# Patient Record
Sex: Female | Born: 1955
Health system: Southern US, Community
[De-identification: ages and names within clinical notes are randomized; demographics above are authoritative.]

## PROBLEM LIST (undated history)

## (undated) DIAGNOSIS — H409 Unspecified glaucoma: Secondary | ICD-10-CM

## (undated) DIAGNOSIS — E7800 Pure hypercholesterolemia, unspecified: Secondary | ICD-10-CM

## (undated) DIAGNOSIS — R519 Headache, unspecified: Secondary | ICD-10-CM

## (undated) DIAGNOSIS — M5030 Other cervical disc degeneration, unspecified cervical region: Secondary | ICD-10-CM

## (undated) DIAGNOSIS — D649 Anemia, unspecified: Secondary | ICD-10-CM

## (undated) DIAGNOSIS — O1490 Unspecified pre-eclampsia, unspecified trimester: Secondary | ICD-10-CM

## (undated) DIAGNOSIS — N390 Urinary tract infection, site not specified: Secondary | ICD-10-CM

## (undated) DIAGNOSIS — K219 Gastro-esophageal reflux disease without esophagitis: Secondary | ICD-10-CM

## (undated) DIAGNOSIS — G8929 Other chronic pain: Secondary | ICD-10-CM

## (undated) DIAGNOSIS — K118 Other diseases of salivary glands: Secondary | ICD-10-CM

## (undated) DIAGNOSIS — R51 Headache: Secondary | ICD-10-CM

## (undated) DIAGNOSIS — K760 Fatty (change of) liver, not elsewhere classified: Secondary | ICD-10-CM

## (undated) HISTORY — DX: Headache: R51

## (undated) HISTORY — DX: Unspecified pre-eclampsia, unspecified trimester: O14.90

## (undated) HISTORY — DX: Anemia, unspecified: D64.9

## (undated) HISTORY — PX: COLONOSCOPY: SHX174

## (undated) HISTORY — DX: Other chronic pain: G89.29

## (undated) HISTORY — DX: Urinary tract infection, site not specified: N39.0

## (undated) HISTORY — DX: Gastro-esophageal reflux disease without esophagitis: K21.9

## (undated) HISTORY — DX: Headache, unspecified: R51.9

## (undated) HISTORY — PX: ABDOMINAL HYSTERECTOMY: SHX81

---

## 1985-04-05 HISTORY — PX: TUBAL LIGATION: SHX77

## 2004-05-05 ENCOUNTER — Ambulatory Visit: Payer: Self-pay | Admitting: Internal Medicine

## 2004-06-24 ENCOUNTER — Ambulatory Visit: Payer: Self-pay

## 2004-08-19 ENCOUNTER — Ambulatory Visit: Payer: Self-pay

## 2005-06-16 ENCOUNTER — Ambulatory Visit: Payer: Self-pay | Admitting: Internal Medicine

## 2005-07-20 ENCOUNTER — Ambulatory Visit: Payer: Self-pay | Admitting: Internal Medicine

## 2006-01-06 ENCOUNTER — Ambulatory Visit: Payer: Self-pay | Admitting: Gastroenterology

## 2006-09-28 ENCOUNTER — Ambulatory Visit: Payer: Self-pay

## 2006-10-11 ENCOUNTER — Ambulatory Visit: Payer: Self-pay | Admitting: Internal Medicine

## 2006-10-25 ENCOUNTER — Emergency Department: Payer: Self-pay | Admitting: Emergency Medicine

## 2006-11-09 ENCOUNTER — Ambulatory Visit: Payer: Self-pay | Admitting: Internal Medicine

## 2006-11-14 ENCOUNTER — Ambulatory Visit: Payer: Self-pay

## 2006-11-17 ENCOUNTER — Ambulatory Visit: Payer: Self-pay

## 2007-03-01 ENCOUNTER — Ambulatory Visit: Payer: Self-pay | Admitting: Gastroenterology

## 2007-03-21 ENCOUNTER — Ambulatory Visit: Payer: Self-pay

## 2007-03-23 ENCOUNTER — Inpatient Hospital Stay: Payer: Self-pay

## 2007-11-10 ENCOUNTER — Ambulatory Visit: Payer: Self-pay

## 2007-11-27 ENCOUNTER — Emergency Department: Payer: Self-pay | Admitting: Emergency Medicine

## 2008-11-26 ENCOUNTER — Ambulatory Visit: Payer: Self-pay | Admitting: Internal Medicine

## 2009-01-13 ENCOUNTER — Ambulatory Visit: Payer: Self-pay | Admitting: Internal Medicine

## 2010-01-14 ENCOUNTER — Ambulatory Visit: Payer: Self-pay | Admitting: Internal Medicine

## 2010-03-27 ENCOUNTER — Other Ambulatory Visit: Payer: Self-pay | Admitting: Internal Medicine

## 2011-03-26 ENCOUNTER — Inpatient Hospital Stay: Payer: Self-pay | Admitting: *Deleted

## 2011-04-07 ENCOUNTER — Ambulatory Visit: Payer: Self-pay | Admitting: Internal Medicine

## 2011-04-13 ENCOUNTER — Ambulatory Visit: Payer: Self-pay | Admitting: Internal Medicine

## 2012-05-29 ENCOUNTER — Encounter: Payer: Self-pay | Admitting: *Deleted

## 2012-05-30 ENCOUNTER — Ambulatory Visit (INDEPENDENT_AMBULATORY_CARE_PROVIDER_SITE_OTHER): Payer: BC Managed Care – PPO | Admitting: Internal Medicine

## 2012-05-30 VITALS — BP 140/90 | HR 64 | Temp 97.6°F | Ht 61.5 in | Wt 116.0 lb

## 2012-05-30 DIAGNOSIS — M5030 Other cervical disc degeneration, unspecified cervical region: Secondary | ICD-10-CM

## 2012-05-30 DIAGNOSIS — Z860100 Personal history of colon polyps, unspecified: Secondary | ICD-10-CM

## 2012-05-30 DIAGNOSIS — Z1239 Encounter for other screening for malignant neoplasm of breast: Secondary | ICD-10-CM

## 2012-05-30 DIAGNOSIS — Z13220 Encounter for screening for lipoid disorders: Secondary | ICD-10-CM

## 2012-05-30 DIAGNOSIS — R030 Elevated blood-pressure reading, without diagnosis of hypertension: Secondary | ICD-10-CM

## 2012-05-30 DIAGNOSIS — IMO0001 Reserved for inherently not codable concepts without codable children: Secondary | ICD-10-CM

## 2012-05-30 DIAGNOSIS — K219 Gastro-esophageal reflux disease without esophagitis: Secondary | ICD-10-CM

## 2012-05-30 LAB — COMPREHENSIVE METABOLIC PANEL
AST: 23 U/L (ref 0–37)
Albumin: 4 g/dL (ref 3.5–5.2)
Alkaline Phosphatase: 55 U/L (ref 39–117)
BUN: 16 mg/dL (ref 6–23)
Potassium: 4.6 mEq/L (ref 3.5–5.1)
Total Bilirubin: 0.8 mg/dL (ref 0.3–1.2)

## 2012-05-30 LAB — LDL CHOLESTEROL, DIRECT: Direct LDL: 119.7 mg/dL

## 2012-05-30 LAB — CBC WITH DIFFERENTIAL/PLATELET
Basophils Relative: 0.8 % (ref 0.0–3.0)
Eosinophils Absolute: 0.1 10*3/uL (ref 0.0–0.7)
Lymphs Abs: 2.4 10*3/uL (ref 0.7–4.0)
MCHC: 32.8 g/dL (ref 30.0–36.0)
MCV: 91.3 fl (ref 78.0–100.0)
Monocytes Absolute: 0.3 10*3/uL (ref 0.1–1.0)
Neutrophils Relative %: 29.9 % — ABNORMAL LOW (ref 43.0–77.0)
Platelets: 259 10*3/uL (ref 150.0–400.0)

## 2012-05-30 LAB — LIPID PANEL
Cholesterol: 214 mg/dL — ABNORMAL HIGH (ref 0–200)
HDL: 84.2 mg/dL (ref >39.00)
Total CHOL/HDL Ratio: 3
Triglycerides: 36 mg/dL (ref 0.0–149.0)
VLDL: 7.2 mg/dL (ref 0.0–40.0)

## 2012-05-30 MED ORDER — PANTOPRAZOLE SODIUM 40 MG PO TBEC: 40.0000 mg | DELAYED_RELEASE_TABLET | Freq: Every day | ORAL | Status: DC

## 2012-05-30 MED ORDER — CYCLOBENZAPRINE HCL 5 MG PO TABS: 5.0000 mg | ORAL_TABLET | Freq: Every evening | ORAL | Status: DC | PRN

## 2012-05-30 MED ORDER — ESTRADIOL 0.1 MG/GM VA CREA: TOPICAL_CREAM | VAGINAL | Status: DC

## 2012-05-31 ENCOUNTER — Other Ambulatory Visit: Payer: Self-pay | Admitting: Internal Medicine

## 2012-05-31 DIAGNOSIS — D72819 Decreased white blood cell count, unspecified: Secondary | ICD-10-CM

## 2012-05-31 NOTE — Progress Notes (Signed)
Order placed for follow up cbc 

## 2012-06-07 ENCOUNTER — Ambulatory Visit: Payer: Self-pay | Admitting: Internal Medicine

## 2012-06-11 ENCOUNTER — Encounter: Payer: Self-pay | Admitting: Internal Medicine

## 2012-06-11 DIAGNOSIS — M5030 Other cervical disc degeneration, unspecified cervical region: Secondary | ICD-10-CM | POA: Insufficient documentation

## 2012-06-11 DIAGNOSIS — K219 Gastro-esophageal reflux disease without esophagitis: Secondary | ICD-10-CM | POA: Insufficient documentation

## 2012-06-11 DIAGNOSIS — Z86010 Personal history of colonic polyps: Secondary | ICD-10-CM | POA: Insufficient documentation

## 2012-06-11 NOTE — Assessment & Plan Note (Signed)
On protonix.  Symptoms controlled.   

## 2012-06-11 NOTE — Progress Notes (Signed)
  Subjective:    Patient ID: Angel Taylor, female    DOB: November 07, 1955, 57 y.o.   MRN: [id number]  HPI 57 year old female with past history of reoccurring uti's and GERD who comes in today for a scheduled follow up and to switch her care over to [place].  She states the protonix is working.  Off premarin, just using estrace prn.  Uses Flexeril prn.  Helps her neck.  Handling stress relatively well.  No chest pain or tightness.  No sob.  Eating and drinking well.  Bowels stable.    Past Medical History  Diagnosis Date  . GERD (gastroesophageal reflux disease)   . Anemia   . Chronic headaches   . Frequent UTI   . Pre-eclampsia     Review of Systems Patient denies any headache, lightheadedness or dizziness.  No sinus or allergy symptoms.  No chest pain, tightness or palpitations.  No increased shortness of breath, cough or congestion.  No nausea or vomiting.  Protonix controlling her acid reflux.  No abdominal pain or cramping.  No bowel change, such as diarrhea, constipation, BRBPR or melana.  No urine change.   Seeing GI - regarding colonoscopy.      Objective:   Physical Exam Filed Vitals:   05/30/12 0842  BP: 140/90  Pulse: 64  Temp: 97.6 F (41.58 C)   57 year old female in no acute distress.   HEENT:  Nares- clear.  Oropharynx - without lesions. NECK:  Supple.  Nontender.  No audible bruit.  HEART:  Appears to be regular. LUNGS:  No crackles or wheezing audible.  Respirations even and unlabored.  RADIAL PULSE:  Equal bilaterally.   ABDOMEN:  Soft, nontender.  Bowel sounds present and normal.  No audible abdominal bruit.  EXTREMITIES:  No increased edema present.  DP pulses palpable and equal bilaterally.           Assessment & Plan:  CARDIOVASCULAR.  Had a stress test last year - negative for ischemia.  Currently asymptomatic.   GYN.  S/p hysterectomy.  Using estrace.  Off premarin.   PREVIOUS VISION CHANGE.  Worked up by Dr Oren Bracket.  Prisma helped.   ELEVATED BLOOD  PRESSURE.  She will spot check her pressure.  Get her back in soon to reassess.  If persistent elevation, will need medication.   HEALTH MAINTENANCE.  Schedule a physical for next visit.  Schedule mammogram.  Seeing GI for a follow up colonoscopy.

## 2012-06-11 NOTE — Assessment & Plan Note (Signed)
Colonoscopy 03/01/07 revealed an adenomatous polyp.  Due follow up 02/2012.  She is scheduled to follow up with GI for follow up colonoscopy.

## 2012-06-11 NOTE — Assessment & Plan Note (Signed)
Previous C-spine xray revealed loss of the normal cervical lordosis and degenerative disc disease involving C3-4, C4-5 and C5-6.  Flexeril prn helps.  Massage therapy helped.  Unable to afford physical therapy.

## 2012-06-23 ENCOUNTER — Encounter: Payer: Self-pay | Admitting: Internal Medicine

## 2012-06-26 ENCOUNTER — Other Ambulatory Visit: Payer: BC Managed Care – PPO

## 2012-07-06 ENCOUNTER — Encounter: Payer: Self-pay | Admitting: *Deleted

## 2012-07-24 ENCOUNTER — Encounter: Payer: BC Managed Care – PPO | Admitting: Internal Medicine

## 2012-08-08 ENCOUNTER — Encounter: Payer: BC Managed Care – PPO | Admitting: Internal Medicine

## 2012-09-04 ENCOUNTER — Encounter: Payer: Self-pay | Admitting: Internal Medicine

## 2012-09-04 ENCOUNTER — Ambulatory Visit (INDEPENDENT_AMBULATORY_CARE_PROVIDER_SITE_OTHER): Payer: BC Managed Care – PPO | Admitting: Internal Medicine

## 2012-09-04 VITALS — BP 110/80 | HR 63 | Temp 97.9°F | Ht 61.1 in | Wt 118.0 lb

## 2012-09-04 DIAGNOSIS — Z860100 Personal history of colon polyps, unspecified: Secondary | ICD-10-CM

## 2012-09-04 DIAGNOSIS — K219 Gastro-esophageal reflux disease without esophagitis: Secondary | ICD-10-CM

## 2012-09-04 DIAGNOSIS — M5030 Other cervical disc degeneration, unspecified cervical region: Secondary | ICD-10-CM

## 2012-09-05 ENCOUNTER — Other Ambulatory Visit (INDEPENDENT_AMBULATORY_CARE_PROVIDER_SITE_OTHER): Payer: BC Managed Care – PPO

## 2012-09-05 DIAGNOSIS — D72819 Decreased white blood cell count, unspecified: Secondary | ICD-10-CM

## 2012-09-05 LAB — CBC WITH DIFFERENTIAL/PLATELET
Basophils Absolute: 0.1 10*3/uL (ref 0.0–0.1)
Basophils Relative: 1.4 % (ref 0.0–3.0)
Eosinophils Absolute: 0.3 10*3/uL (ref 0.0–0.7)
Eosinophils Relative: 4.9 % (ref 0.0–5.0)
HCT: 38.6 % (ref 36.0–46.0)
Hemoglobin: 12.8 g/dL (ref 12.0–15.0)
Lymphocytes Relative: 54.8 % — ABNORMAL HIGH (ref 12.0–46.0)
Lymphs Abs: 3 10*3/uL (ref 0.7–4.0)
MCHC: 33.1 g/dL (ref 30.0–36.0)
MCV: 92.3 fl (ref 78.0–100.0)
Monocytes Absolute: 0.4 10*3/uL (ref 0.1–1.0)
Monocytes Relative: 7.2 % (ref 3.0–12.0)
Neutro Abs: 1.7 10*3/uL (ref 1.4–7.7)
Neutrophils Relative %: 31.7 % — ABNORMAL LOW (ref 43.0–77.0)
Platelets: 252 10*3/uL (ref 150.0–400.0)
RBC: 4.18 Mil/uL (ref 3.87–5.11)
RDW: 13.5 % (ref 11.5–14.6)
WBC: 5.4 10*3/uL (ref 4.5–10.5)

## 2012-09-06 ENCOUNTER — Encounter: Payer: Self-pay | Admitting: *Deleted

## 2012-09-06 ENCOUNTER — Encounter: Payer: Self-pay | Admitting: Internal Medicine

## 2012-09-06 NOTE — Assessment & Plan Note (Signed)
Colonoscopy 03/01/07 revealed an adenomatous polyp.  F/u colonoscopy 4/14 - no polyps.

## 2012-09-06 NOTE — Assessment & Plan Note (Signed)
On protonix.  Symptoms controlled.   

## 2012-09-06 NOTE — Assessment & Plan Note (Signed)
Previous C-spine xray revealed loss of the normal cervical lordosis and degenerative disc disease involving C3-4, C4-5 and C5-6.  Flexeril prn helps.  Massage therapy helped.  Unable to afford physical therapy.  Exercises helping.  Follow.

## 2012-09-06 NOTE — Progress Notes (Signed)
  Subjective:    Patient ID: Angel Taylor, female    DOB: 1956-01-21, 57 y.o.   MRN: [id number]  HPI 57 year old female with past history of reoccurring uti's and GERD who comes in today to follow up on these issues as well as for a complete physical exam.  She states the protonix is working.  No significant acid reflux.  Uses Flexeril prn.  Helps her neck.  Also, does neck exercises - which help.  Handling stress relatively well.  No chest pain or tightness.  No sob.  Eating and drinking well.  Bowels stable.  Had colonoscopy 4/14.  No polyps.     Past Medical History  Diagnosis Date  . GERD (gastroesophageal reflux disease)   . Anemia   . Chronic headaches   . Frequent UTI   . Pre-eclampsia     Current Outpatient Prescriptions on File Prior to Visit  Medication Sig Dispense Refill  . cyclobenzaprine (FLEXERIL) 5 MG tablet Take 1 tablet (5 mg total) by mouth at bedtime as needed for muscle spasms.  30 tablet  0  . estradiol (ESTRACE) 0.1 MG/GM vaginal cream Use as directed  42.5 g  1  . pantoprazole (PROTONIX) 40 MG tablet Take 1 tablet (40 mg total) by mouth daily.  30 tablet  3   No current facility-administered medications on file prior to visit.    Review of Systems Patient denies any headache, lightheadedness or dizziness.  No sinus or allergy symptoms.  No chest pain, tightness or palpitations.  No increased shortness of breath, cough or congestion.  No nausea or vomiting.  Protonix controlling her acid reflux.  No abdominal pain or cramping.  No bowel change, such as diarrhea, constipation, BRBPR or melana.  No urine change.   Sees GI.  Just had colonoscopy and EGD.  Neck exercises help.       Objective:   Physical Exam  Filed Vitals:   09/04/12 1058  BP: 110/80  Pulse: 63  Temp: 97.9 F (36.6 C)   Blood pressure recheck:  140-94/55-50  57 year old female in no acute distress.   HEENT:  Nares- clear.  Oropharynx - without lesions. NECK:  Supple.  Nontender.  No  audible bruit.  HEART:  Appears to be regular. LUNGS:  No crackles or wheezing audible.  Respirations even and unlabored.  RADIAL PULSE:  Equal bilaterally.    BREASTS:  No nipple discharge or nipple retraction present.  Could not appreciate any distinct nodules or axillary adenopathy.  ABDOMEN:  Soft, nontender.  Bowel sounds present and normal.  No audible abdominal bruit.  GU:  Normal external genitalia.  Vaginal vault without lesions.  S/p hysterectomy.  Could not appreciate any adnexal masses or tenderness.   RECTAL:  Heme negative.   EXTREMITIES:  No increased edema present.  DP pulses palpable and equal bilaterally.            Assessment & Plan:  CARDIOVASCULAR.  Had a stress test last year - negative for ischemia.  Currently asymptomatic.   GYN.  S/p hysterectomy.  Using estrace.  Off premarin.   PREVIOUS VISION CHANGE.  Worked up by Dr Oren Bracket.  Prism helped.   ELEVATED BLOOD PRESSURE.  She will spot check her pressure.  Get her back in soon to reassess.  If persistent elevation, will need medication.   HEALTH MAINTENANCE.  Physical today.  Mammogram 06/07/12 - Birads II.  Colonoscopy 4/14 - no polyps.

## 2012-11-10 ENCOUNTER — Ambulatory Visit: Payer: BC Managed Care – PPO | Admitting: Internal Medicine

## 2013-04-27 ENCOUNTER — Ambulatory Visit (INDEPENDENT_AMBULATORY_CARE_PROVIDER_SITE_OTHER)
Admission: RE | Admit: 2013-04-27 | Discharge: 2013-04-27 | Disposition: A | Discharge status: Home / Self Care | Payer: BC Managed Care – PPO | Source: Ambulatory Visit | Attending: Internal Medicine | Admitting: Internal Medicine

## 2013-04-27 ENCOUNTER — Encounter: Payer: Self-pay | Admitting: *Deleted

## 2013-04-27 ENCOUNTER — Encounter: Payer: Self-pay | Admitting: Internal Medicine

## 2013-04-27 ENCOUNTER — Ambulatory Visit (INDEPENDENT_AMBULATORY_CARE_PROVIDER_SITE_OTHER): Payer: BC Managed Care – PPO | Admitting: Internal Medicine

## 2013-04-27 VITALS — BP 130/80 | HR 76 | Temp 98.3°F | Ht 61.1 in | Wt 114.5 lb

## 2013-04-27 DIAGNOSIS — R059 Cough, unspecified: Secondary | ICD-10-CM

## 2013-04-27 DIAGNOSIS — R05 Cough: Secondary | ICD-10-CM

## 2013-04-27 DIAGNOSIS — J069 Acute upper respiratory infection, unspecified: Secondary | ICD-10-CM

## 2013-04-27 MED ORDER — PREDNISONE 10 MG PO TABS: ORAL_TABLET | ORAL | Status: DC

## 2013-04-27 MED ORDER — LEVOFLOXACIN 500 MG PO TABS: 500.0000 mg | ORAL_TABLET | Freq: Every day | ORAL | Status: DC

## 2013-04-27 MED ORDER — ALBUTEROL SULFATE HFA 108 (90 BASE) MCG/ACT IN AERS: 2.0000 | INHALATION_SPRAY | Freq: Four times a day (QID) | RESPIRATORY_TRACT | Status: DC | PRN

## 2013-04-27 NOTE — Patient Instructions (Signed)
Saline nasal spray - flush nose at least 2-3x/day.  Mucinex DM in the am and Robitussin DM in the evening

## 2013-04-27 NOTE — Progress Notes (Signed)
Pre-visit discussion using our clinic review tool. No additional management support is needed unless otherwise documented below in the visit note.  

## 2013-04-29 ENCOUNTER — Encounter: Payer: Self-pay | Admitting: Internal Medicine

## 2013-04-29 DIAGNOSIS — J069 Acute upper respiratory infection, unspecified: Secondary | ICD-10-CM | POA: Insufficient documentation

## 2013-04-29 NOTE — Assessment & Plan Note (Addendum)
Sinusitis/uri.  Treat with levaquin as directed.  Saline nasal flushes as directed.  mucnex DM and robitussin DM as directed.  Prednisone taper starting at 60mg  and decreasing by 5mg  each day until off.  Albuterol inhaler as directed.  Check cxr.

## 2013-04-29 NOTE — Progress Notes (Signed)
  Subjective:    Patient ID: Angel Taylor, female    DOB: Dec 18, 1955, 58 y.o.   MRN: [id number]  URI   58 year old female with past history of reoccurring uti's and GERD who comes in today as a work in with concerns regarding persistent cough and congestion.   She states the protonix is working.  No significant acid reflux.  States symptoms initially started several weeks ago.  Went to [facility].  Given a zpak and instructed to take mucinex.  One week ago developed increased sinus pressure.  Increased cough.  Took amoxicillin.  Did not tolerate.  Increased nasal congestion and stuffiness.  No sore throat.  Increased sinus pressure and cough.  Soreness from the cough.  Mucus production - colored green mucus.  Took Delsym last night.  Minimal nausea.  She is eating and drinking.  No vomiting.  no significant diarrhea.       Past Medical History  Diagnosis Date  . GERD (gastroesophageal reflux disease)   . Anemia   . Chronic headaches   . Frequent UTI   . Pre-eclampsia     Current Outpatient Prescriptions on File Prior to Visit  Medication Sig Dispense Refill  . cyclobenzaprine (FLEXERIL) 5 MG tablet Take 1 tablet (5 mg total) by mouth at bedtime as needed for muscle spasms.  30 tablet  0  . estradiol (ESTRACE) 0.1 MG/GM vaginal cream Use as directed  42.5 g  1  . pantoprazole (PROTONIX) 40 MG tablet Take 1 tablet (40 mg total) by mouth daily.  30 tablet  3   No current facility-administered medications on file prior to visit.    Review of Systems Patient denies any headache, lightheadedness or dizziness.  Does report increased sinus pressure and congestion.   No chest pain, tightness or palpitations.  No increased shortness of breath.  Does report increased cough and congestion.  Productive green mucus.   No  vomiting.  Minimal nausea.  Eating and drinking.  Protonix controlling her acid reflux.  No abdominal pain or cramping.  No bowel change, such as significant diarrhea.  Some loose stool.       Objective:   Physical Exam  Filed Vitals:   04/27/13 1424  BP: 130/80  Pulse: 76  Temp: 98.3 F (53.5 C)   58 year old female in no acute distress.   HEENT:  Nares- erythematous turbinates.   Oropharynx - without lesions.  Minimal tenderness to palpation over the sinuses. NECK:  Supple.  Nontender. HEART:  Appears to be regular. LUNGS:  No crackles or wheezing audible.  Respirations even and unlabored.  Increased congestion.  Cleared some with coughing.           Assessment & Plan:  CARDIOVASCULAR.  Had a stress test last year - negative for ischemia.  Currently asymptomatic.   HEALTH MAINTENANCE.  Physical last visit.  Mammogram 06/07/12 - Birads II.  Colonoscopy 4/14 - no polyps.

## 2013-05-02 ENCOUNTER — Encounter: Payer: Self-pay | Admitting: *Deleted

## 2013-05-02 ENCOUNTER — Telehealth: Payer: Self-pay | Admitting: *Deleted

## 2013-05-02 NOTE — Telephone Encounter (Signed)
Pt is feeling better, but not 100%-new work note placed up front for pick up.

## 2013-05-02 NOTE — Telephone Encounter (Signed)
Please confirm that pt is doing better.  If no better, needs reevaluation.  If better but just needing more time - I am ok to extend date for work note to return on Monday.

## 2013-05-02 NOTE — Telephone Encounter (Signed)
Pt called to inform you that she was not able to go back to work today. She still doesn't feel up to par. Still has rib pain when coughing & would like to have her work note extended to Monday to give her time to complete her medication. Pt requesting a call back today.

## 2013-05-04 NOTE — Telephone Encounter (Signed)
She had increased coughing and pain with coughing when I evaluated her.  If change in pain or worsening pain - needs reevaluation.  Since Friday pm - acute care or Saturday clinic.  If pain from coughing, may be sore for a while.  Can take tylenol.  If any sob, acute sx - needs eval.

## 2013-05-04 NOTE — Telephone Encounter (Signed)
Pt picked up work note this morning and wanted to know  Pt stated when she breaths deep and move she still has pain in chest area.  She wanted to know if this is normal and how long should this last.  She stated when she goes back to work she will have to move and lift stuff.  Pt is concerned about the pain  Please call pt

## 2013-05-04 NOTE — Telephone Encounter (Signed)
Please advise 

## 2013-05-05 NOTE — Telephone Encounter (Signed)
If feeling better, needs to monitor.  Let us know if persistent problems.

## 2013-05-05 NOTE — Telephone Encounter (Signed)
Cough has improved, just soreness with deep breath. Mucous is clear now & she is beginning to feel better. No increase in cough.

## 2013-05-11 ENCOUNTER — Ambulatory Visit (INDEPENDENT_AMBULATORY_CARE_PROVIDER_SITE_OTHER): Payer: BC Managed Care – PPO | Admitting: Adult Health

## 2013-05-11 ENCOUNTER — Encounter: Payer: Self-pay | Admitting: Adult Health

## 2013-05-11 VITALS — BP 106/66 | HR 73 | Temp 97.4°F | Resp 12 | Wt 118.0 lb

## 2013-05-11 DIAGNOSIS — M940 Chondrocostal junction syndrome [Tietze]: Secondary | ICD-10-CM

## 2013-05-11 DIAGNOSIS — J069 Acute upper respiratory infection, unspecified: Secondary | ICD-10-CM

## 2013-05-11 NOTE — Patient Instructions (Signed)
  Try ibuprofen for 2-3 days to see if this improves your symptoms. Take this with food.  Costochondritis Costochondritis is a condition in which the tissue (cartilage) that connects your ribs with your breastbone (sternum) becomes irritated. It causes pain in the chest and rib area. It usually goes away on its own over time. HOME CARE  Avoid activities that wear you out.  Do not strain your ribs. Avoid activities that use your:  Chest.  Belly.  Side muscles.  Put ice on the area for the first 2 days after the pain starts.  Put ice in a plastic bag.  Place a towel between your skin and the bag.  Leave the ice on for 20 minutes, 2 3 times a day.  Only take medicine as told by your doctor. GET HELP IF:  You have redness or puffiness (swelling) in the rib area.  Your pain does not go away with rest or medicine. GET HELP RIGHT AWAY IF:   Your pain gets worse.  You are very uncomfortable.  You have trouble breathing.  You cough up blood.  You start sweating or throwing up (vomiting).  You have a fever or lasting symptoms for more than 2 3 days.  You have a fever and your symptoms suddenly get worse. MAKE SURE YOU:   Understand these instructions.  Will watch your condition.  Will get help right away if you are not doing well or get worse. Document Released: 09/08/2007 Document Revised: 11/22/2012 Document Reviewed: 10/24/2012 [facility] Patient Information 2014 [place].   You can try applying some ice to the areas that are most tender.  Avoiding activity that aggravates your symptoms will allow you to heal faster.

## 2013-05-11 NOTE — Progress Notes (Signed)
Patient ID: Vernestine Brodhead, female   DOB: 06/09/1955, 58 y.o.   MRN: [id number]    Subjective:    Patient ID: Voncille Simm, female    DOB: April 14, 1955, 58 y.o.   MRN: [id number]  HPI  Past Medical History  Diagnosis Date  . GERD (gastroesophageal reflux disease)   . Anemia   . Chronic headaches   . Frequent UTI   . Pre-eclampsia     Current Outpatient Prescriptions on File Prior to Visit  Medication Sig Dispense Refill  . albuterol (PROVENTIL HFA;VENTOLIN HFA) 108 (90 BASE) MCG/ACT inhaler Inhale 2 puffs into the lungs every 6 (six) hours as needed for wheezing or shortness of breath.  1 Inhaler  0  . cyclobenzaprine (FLEXERIL) 5 MG tablet Take 1 tablet (5 mg total) by mouth at bedtime as needed for muscle spasms.  30 tablet  0  . estradiol (ESTRACE) 0.1 MG/GM vaginal cream Use as directed  42.5 g  1  . pantoprazole (PROTONIX) 40 MG tablet Take 1 tablet (40 mg total) by mouth daily.  30 tablet  3   No current facility-administered medications on file prior to visit.     Review of Systems  Constitutional: Negative for fever, chills and fatigue.  HENT: Negative for congestion, postnasal drip, rhinorrhea, sinus pressure and sore throat.   Respiratory: Positive for cough.        Rib pain from coughing. Cough has significantly improved  Cardiovascular: Negative.   All other systems reviewed and are negative.       Objective:  BP 106/66  Pulse 73  Temp(Src) 97.4 F (36.3 C) (Oral)  Resp 12  Wt 118 lb (53.524 kg)  SpO2 99%   Physical Exam  Constitutional: She is oriented to person, place, and time.  Cardiovascular: Normal rate, regular rhythm and normal heart sounds.  Exam reveals no gallop.   No murmur heard. Pulmonary/Chest: Effort normal and breath sounds normal. No respiratory distress. She has no wheezes. She has no rales.  Few rhonchi right upper lob.   Musculoskeletal: Normal range of motion.  Neurological: She is alert and oriented to person, place, and time. She has  normal reflexes.  Skin: Skin is warm and dry.  Psychiatric: She has a normal mood and affect. Her behavior is normal. Judgment and thought content normal.       Assessment & Plan:    1. URI (upper respiratory infection) Follow up for recent upper respiratory infection. She reports having excessive coughing during that time which began to hurt her ribs. The cough has improved significantly. Reports minimal coughing.  2. Costochondritis From excessive coughing. Ibuprofen for 2-3 days - take with food. Apply ice to the affected area and avoid activities that aggravate symptoms. RTC if no improvement within 1 week.

## 2013-05-11 NOTE — Progress Notes (Signed)
Pre visit review using our clinic review tool, if applicable. No additional management support is needed unless otherwise documented below in the visit note. 

## 2013-05-14 ENCOUNTER — Telehealth: Payer: Self-pay | Admitting: *Deleted

## 2013-05-14 NOTE — Telephone Encounter (Signed)
Saw Raquel on Friday to make sure lungs were clear & evaluate rib soreness. When Raquel listened to lungs, she heard 3 small pockets of fluid in lungs. Had discussed x-rays but held off since she has already had x-rays & treated with pneumonia. Wants to know if there is anything else that she needs to do about the fluid or will it resolve on its own.

## 2013-05-15 ENCOUNTER — Encounter: Payer: Self-pay | Admitting: Internal Medicine

## 2013-05-15 ENCOUNTER — Encounter: Payer: Self-pay | Admitting: *Deleted

## 2013-05-15 ENCOUNTER — Ambulatory Visit (INDEPENDENT_AMBULATORY_CARE_PROVIDER_SITE_OTHER): Payer: BC Managed Care – PPO | Admitting: Internal Medicine

## 2013-05-15 VITALS — BP 120/80 | HR 63 | Temp 98.0°F | Ht 61.1 in | Wt 114.8 lb

## 2013-05-15 DIAGNOSIS — J069 Acute upper respiratory infection, unspecified: Secondary | ICD-10-CM

## 2013-05-15 NOTE — Progress Notes (Signed)
Pre-visit discussion using our clinic review tool. No additional management support is needed unless otherwise documented below in the visit note.  

## 2013-05-15 NOTE — Telephone Encounter (Signed)
Left detailed message to see if patient could come this morning at 11:15.

## 2013-05-15 NOTE — Telephone Encounter (Signed)
I can work her in Tuesday 05/15/13 for reevaluation.

## 2013-05-15 NOTE — Telephone Encounter (Signed)
I spoke with patient's husband this morning & he is going to get in touch with Renee. I put her on the schedule for today at 3:15 & he will let her know.

## 2013-05-20 ENCOUNTER — Encounter: Payer: Self-pay | Admitting: Internal Medicine

## 2013-05-20 NOTE — Assessment & Plan Note (Signed)
Better.  Lungs clear.  Overall feels better and doing well.  Released to go back to work.

## 2013-05-20 NOTE — Progress Notes (Signed)
  Subjective:    Patient ID: Angel Taylor, female    DOB: May 12, 1955, 58 y.o.   MRN: [id number]  URI   58 year old female with past history of reoccurring uti's and GERD who comes in today as a work in with concerns regarding persistent cough and congestion.   She states the protonix is working.  No significant acid reflux.  States symptoms initially started several weeks ago.  Went to [facility].  Given a zpak and instructed to take mucinex.   Had persistent symptoms when I saw her last visit.  Started on a prednisone taper.  She comes in today to have me check her To confirm clearance.  She is eating and drinking.  No vomiting.  no significant diarrhea.   No significant cough, just occasional stuffiness.  Overall feels better.      Past Medical History  Diagnosis Date  . GERD (gastroesophageal reflux disease)   . Anemia   . Chronic headaches   . Frequent UTI   . Pre-eclampsia     Current Outpatient Prescriptions on File Prior to Visit  Medication Sig Dispense Refill  . albuterol (PROVENTIL HFA;VENTOLIN HFA) 108 (90 BASE) MCG/ACT inhaler Inhale 2 puffs into the lungs every 6 (six) hours as needed for wheezing or shortness of breath.  1 Inhaler  0  . cyclobenzaprine (FLEXERIL) 5 MG tablet Take 1 tablet (5 mg total) by mouth at bedtime as needed for muscle spasms.  30 tablet  0  . pantoprazole (PROTONIX) 40 MG tablet Take 1 tablet (40 mg total) by mouth daily.  30 tablet  3  . estradiol (ESTRACE) 0.1 MG/GM vaginal cream Use as directed  42.5 g  1   No current facility-administered medications on file prior to visit.    Review of Systems Patient denies any headache, lightheadedness or dizziness.  Minimal stuffiness.   No chest pain, tightness or palpitations. No increased shortness of breath.  Cough and congestion better.   No  vomiting.  No nausea.  Eating and drinking well.  Protonix controlling her acid reflux.  No abdominal pain or cramping.  No bowel change, such as significant diarrhea.       Objective:   Physical Exam  Filed Vitals:   05/15/13 1514  BP: 120/80  Pulse: 63  Temp: 98 F (64.68 C)   58 year old female in no acute distress.   HEENT:  Nares- clear.  Oropharynx - without lesions.   NECK:  Supple.  Nontender. HEART:  Appears to be regular. LUNGS:  No crackles or wheezing audible.  Respirations even and unlabored.  No congestion.  Lungs clear.            Assessment & Plan:  CARDIOVASCULAR.  Had a stress test last year - negative for ischemia.  Currently asymptomatic.   HEALTH MAINTENANCE.  Physical 09/04/12.  Mammogram 06/07/12 - Birads II.  Colonoscopy 4/14 - no polyps.

## 2013-06-12 ENCOUNTER — Ambulatory Visit: Payer: Self-pay | Admitting: Internal Medicine

## 2013-06-12 LAB — HM MAMMOGRAPHY: HM MAMMO: NEGATIVE

## 2013-06-14 ENCOUNTER — Encounter: Payer: Self-pay | Admitting: Internal Medicine

## 2013-09-21 ENCOUNTER — Ambulatory Visit: Payer: BC Managed Care – PPO | Admitting: Adult Health

## 2013-09-24 ENCOUNTER — Other Ambulatory Visit: Payer: Self-pay | Admitting: *Deleted

## 2013-09-24 MED ORDER — ESTRADIOL 0.1 MG/GM VA CREA: TOPICAL_CREAM | VAGINAL | Status: DC

## 2013-10-15 ENCOUNTER — Ambulatory Visit (INDEPENDENT_AMBULATORY_CARE_PROVIDER_SITE_OTHER): Payer: BC Managed Care – PPO | Admitting: Adult Health

## 2013-10-15 ENCOUNTER — Encounter: Payer: Self-pay | Admitting: Adult Health

## 2013-10-15 VITALS — BP 124/80 | HR 64 | Temp 98.2°F | Resp 14 | Ht 61.1 in | Wt 114.5 lb

## 2013-10-15 DIAGNOSIS — R300 Dysuria: Secondary | ICD-10-CM

## 2013-10-15 LAB — POCT URINALYSIS DIPSTICK
Bilirubin, UA: NEGATIVE
Blood, UA: NEGATIVE
Glucose, UA: NEGATIVE
Ketones, UA: NEGATIVE
Leukocytes, UA: NEGATIVE
Nitrite, UA: NEGATIVE
Protein, UA: NEGATIVE
Spec Grav, UA: 1.015
Urobilinogen, UA: 0.2
pH, UA: 7

## 2013-10-15 MED ORDER — PHENAZOPYRIDINE HCL 200 MG PO TABS: 200.0000 mg | ORAL_TABLET | Freq: Three times a day (TID) | ORAL | Status: DC | PRN

## 2013-10-15 MED ORDER — CIPROFLOXACIN HCL 250 MG PO TABS: 250.0000 mg | ORAL_TABLET | Freq: Two times a day (BID) | ORAL | Status: DC

## 2013-10-15 NOTE — Progress Notes (Signed)
Patient ID: Angel Taylor, female   DOB: 11-08-1955, 58 y.o.   MRN: [id number]   Subjective:    Patient ID: Angel Taylor, female    DOB: 21-Mar-1956, 58 y.o.   MRN: [id number]  HPI  Presents to clinic with dysuria. Symptoms have been ongoing for approximately 1-2 weeks. Treated with antibiotic at urgent care. Reports infection did not show up on UA but did on culture. Felt improvement with treatment but thinks it was not enough. Denies fever, chill or hematuria.  Past Medical History  Diagnosis Date  . GERD (gastroesophageal reflux disease)   . Anemia   . Chronic headaches   . Frequent UTI   . Pre-eclampsia     Current Outpatient Prescriptions on File Prior to Visit  Medication Sig Dispense Refill  . estradiol (ESTRACE) 0.1 MG/GM vaginal cream Use as directed  42.5 g  1  . pantoprazole (PROTONIX) 40 MG tablet Take 1 tablet (40 mg total) by mouth daily.  30 tablet  3   No current facility-administered medications on file prior to visit.     Review of Systems  Constitutional: Negative for fever and chills.  Genitourinary: Positive for dysuria, urgency and frequency. Negative for hematuria.       Objective:  BP 124/80  Pulse 64  Temp(Src) 98.2 F (36.8 C) (Oral)  Resp 14  Wt 114 lb 8 oz (51.937 kg)  SpO2 100%   Physical Exam  Constitutional: She is oriented to person, place, and time. No distress.  Cardiovascular: Normal rate and regular rhythm.   Pulmonary/Chest: Effort normal. No respiratory distress.  Musculoskeletal: Normal range of motion.  Neurological: She is alert and oriented to person, place, and time.  Skin: Skin is warm and dry.  Psychiatric: She has a normal mood and affect. Her behavior is normal. Judgment and thought content normal.      Assessment & Plan:   1. Dysuria Cipro 250 mg bid x 7 days. Urine for culture. Pyridium for dysuria. RTC if symptoms no improved within 4-5 days. - POCT urinalysis dipstick

## 2013-10-15 NOTE — Progress Notes (Signed)
Pre visit review using our clinic review tool, if applicable. No additional management support is needed unless otherwise documented below in the visit note. 

## 2013-10-15 NOTE — Patient Instructions (Signed)
  Start cipro 250 mg twice a day for 7 day.  Also, start pyridium 3 times a day as needed for pain with urination.  If no improvement within 4-5 days please follow up with Dr. Nicki Reaper

## 2013-10-17 LAB — URINE CULTURE: Colony Count: 70000

## 2014-05-17 ENCOUNTER — Encounter (INDEPENDENT_AMBULATORY_CARE_PROVIDER_SITE_OTHER): Payer: Self-pay

## 2014-05-17 ENCOUNTER — Ambulatory Visit (INDEPENDENT_AMBULATORY_CARE_PROVIDER_SITE_OTHER): Payer: BLUE CROSS/BLUE SHIELD | Admitting: Internal Medicine

## 2014-05-17 ENCOUNTER — Encounter: Payer: Self-pay | Admitting: Internal Medicine

## 2014-05-17 VITALS — BP 135/82 | HR 62 | Temp 98.0°F | Ht 61.1 in | Wt 119.4 lb

## 2014-05-17 DIAGNOSIS — R059 Cough, unspecified: Secondary | ICD-10-CM

## 2014-05-17 DIAGNOSIS — R05 Cough: Secondary | ICD-10-CM

## 2014-05-17 DIAGNOSIS — N6489 Other specified disorders of breast: Secondary | ICD-10-CM

## 2014-05-17 DIAGNOSIS — K219 Gastro-esophageal reflux disease without esophagitis: Secondary | ICD-10-CM

## 2014-05-17 DIAGNOSIS — R21 Rash and other nonspecific skin eruption: Secondary | ICD-10-CM

## 2014-05-17 MED ORDER — AMOXICILLIN 875 MG PO TABS: 875.0000 mg | ORAL_TABLET | Freq: Two times a day (BID) | ORAL | Status: DC

## 2014-05-17 NOTE — Progress Notes (Signed)
Pre visit review using our clinic review tool, if applicable. No additional management support is needed unless otherwise documented below in the visit note. 

## 2014-05-17 NOTE — Patient Instructions (Signed)
Saline nasal spray - flush nose at least 2-3x/day  Nasacort nasal spray - 2 sprays each nostril one time per day.  Do this in the evening.    Robitussin twice a day

## 2014-05-20 ENCOUNTER — Encounter: Payer: Self-pay | Admitting: Internal Medicine

## 2014-05-20 DIAGNOSIS — N6489 Other specified disorders of breast: Secondary | ICD-10-CM | POA: Insufficient documentation

## 2014-05-20 DIAGNOSIS — R059 Cough, unspecified: Secondary | ICD-10-CM | POA: Insufficient documentation

## 2014-05-20 DIAGNOSIS — R05 Cough: Secondary | ICD-10-CM | POA: Insufficient documentation

## 2014-05-20 DIAGNOSIS — R21 Rash and other nonspecific skin eruption: Secondary | ICD-10-CM | POA: Insufficient documentation

## 2014-05-20 NOTE — Assessment & Plan Note (Signed)
Acid reflux controlled.  Follow.   

## 2014-05-20 NOTE — Assessment & Plan Note (Signed)
Appears to be c/w fungal infection.  Lotrimin cream bid.  Follow.  Notify me if persistent.

## 2014-05-20 NOTE — Assessment & Plan Note (Signed)
Increased nasal congestion, cough and congestion.  Treat with saline nasal spray and nasacort nasal spray as directed.  Robitussin as directed.  Gave her a prescription for amoxicillin to have if infection progresses.  Follow.

## 2014-05-20 NOTE — Assessment & Plan Note (Signed)
Breast exam as outlined.  Left breast appears to be larger than the right.  No nodules appreciated.  Obtain diagnostic mammogram.  Pt wants to wait until next month when due for bilateral mammogram.

## 2014-05-20 NOTE — Progress Notes (Signed)
Patient ID: Angel Taylor, female   DOB: 04-12-1955, 59 y.o.   MRN: [id number]   Subjective:    Patient ID: Angel Taylor, female    DOB: [birth year], 59 y.o.   MRN: [id number]  HPI  Patient here as a work in with several concerns.  Noticed a rash on her left foot.  Some itching.  Dry skin.  Present for several months.  Has tried cortisone cream and aveeno.  She also started having increased post nasal drainage, nasal congestion, cough.  Mucus - colored.  Right side sinus pressure worse than left.  No vomiting.  No sob.  Feels acid reflux - controlled.  Eating and drinking well.  Bowels stable.     Past Medical History  Diagnosis Date  . GERD (gastroesophageal reflux disease)   . Anemia   . Chronic headaches   . Frequent UTI   . Pre-eclampsia     Current Outpatient Prescriptions on File Prior to Visit  Medication Sig Dispense Refill  . estradiol (ESTRACE) 0.1 MG/GM vaginal cream Use as directed 42.5 g 1  . pantoprazole (PROTONIX) 40 MG tablet Take 1 tablet (40 mg total) by mouth daily. 30 tablet 3   No current facility-administered medications on file prior to visit.    Review of Systems  Constitutional: Negative for appetite change and unexpected weight change.  HENT: Positive for congestion, postnasal drip and sinus pressure.   Respiratory: Positive for cough (increased congestion.  productive colored mucus.). Negative for chest tightness and shortness of breath.   Cardiovascular: Negative for chest pain, palpitations and leg swelling.       Increased fullness - left breast.  Left breast larger than right.    Gastrointestinal: Negative for nausea, vomiting, abdominal pain and diarrhea.  Skin: Positive for rash.       Rash - left foot.    Neurological: Negative for dizziness, light-headedness and headaches.       Objective:    Physical Exam  HENT:  Nose: Nose normal.  Mouth/Throat: Oropharynx is clear and moist.  Neck: Neck supple. No thyromegaly present.  Cardiovascular:  Normal rate and regular rhythm.   Pulmonary/Chest: Breath sounds normal. No respiratory distress. She has no wheezes.  Breast exam reveals no nipple discharge or nipple retraction present.  Could not appreciate any adnexal masses or tenderness.  Left breast larger than right.  No axillary adenopathy appreciated.    Abdominal: Soft. Bowel sounds are normal. There is no tenderness.  Musculoskeletal: She exhibits no edema or tenderness.  Lymphadenopathy:    She has no cervical adenopathy.  Skin:  Erythematous rash - left foot - plantar surface.  Some scaling of skin.      BP 135/82 mmHg  Pulse 62  Temp(Src) 98 F (36.7 C) (Oral)  Ht 5' 1.1" (1.552 m)  Wt 119 lb 6 oz (54.148 kg)  BMI 22.48 kg/m2  SpO2 99% Wt Readings from Last 3 Encounters:  05/17/14 119 lb 6 oz (54.148 kg)  10/15/13 114 lb 8 oz (51.937 kg)  05/15/13 114 lb 12 oz (52.05 kg)     Lab Results  Component Value Date   WBC 5.4 09/05/2012   HGB 12.8 09/05/2012   HCT 38.6 09/05/2012   PLT 252.0 09/05/2012   GLUCOSE 87 05/30/2012   CHOL 214* 05/30/2012   TRIG 36.0 05/30/2012   HDL 84.20 05/30/2012   LDLDIRECT 119.7 05/30/2012   ALT 17 05/30/2012   AST 23 05/30/2012   NA 138 05/30/2012   K 4.6 05/30/2012  CL 104 05/30/2012   CREATININE 0.9 05/30/2012   BUN 16 05/30/2012   CO2 27 05/30/2012   TSH 0.97 05/30/2012        Assessment & Plan:   Problem List Items Addressed This Visit    Cough    Increased nasal congestion, cough and congestion.  Treat with saline nasal spray and nasacort nasal spray as directed.  Robitussin as directed.  Gave her a prescription for amoxicillin to have if infection progresses.  Follow.        Fullness of breast    Breast exam as outlined.  Left breast appears to be larger than the right.  No nodules appreciated.  Obtain diagnostic mammogram.  Pt wants to wait until next month when due for bilateral mammogram.       Relevant Orders   MM Digital Diagnostic Bilat   GERD  (gastroesophageal reflux disease) - Primary    Acid reflux controlled.  Follow.        Rash    Appears to be c/w fungal infection.  Lotrimin cream bid.  Follow.  Notify me if persistent.          I spent 25 minutes with the patient and more than 50% of the time was spent in consultation regarding the above.     Einar Pheasant, MD

## 2014-06-14 ENCOUNTER — Ambulatory Visit: Payer: Self-pay | Admitting: Internal Medicine

## 2014-06-14 ENCOUNTER — Ambulatory Visit (INDEPENDENT_AMBULATORY_CARE_PROVIDER_SITE_OTHER): Payer: 59 | Admitting: Internal Medicine

## 2014-06-14 ENCOUNTER — Other Ambulatory Visit (HOSPITAL_COMMUNITY)
Admission: RE | Admit: 2014-06-14 | Discharge: 2014-06-14 | Disposition: A | Discharge status: Home / Self Care | Payer: 59 | Source: Ambulatory Visit | Attending: Internal Medicine | Admitting: Internal Medicine

## 2014-06-14 ENCOUNTER — Encounter: Payer: Self-pay | Admitting: Internal Medicine

## 2014-06-14 VITALS — BP 133/73 | HR 61 | Temp 98.1°F | Ht 61.0 in | Wt 120.2 lb

## 2014-06-14 DIAGNOSIS — Z1151 Encounter for screening for human papillomavirus (HPV): Secondary | ICD-10-CM | POA: Insufficient documentation

## 2014-06-14 DIAGNOSIS — Z01419 Encounter for gynecological examination (general) (routine) without abnormal findings: Secondary | ICD-10-CM | POA: Diagnosis present

## 2014-06-14 DIAGNOSIS — Z658 Other specified problems related to psychosocial circumstances: Secondary | ICD-10-CM

## 2014-06-14 DIAGNOSIS — Z0000 Encounter for general adult medical examination without abnormal findings: Secondary | ICD-10-CM

## 2014-06-14 DIAGNOSIS — F439 Reaction to severe stress, unspecified: Secondary | ICD-10-CM

## 2014-06-14 DIAGNOSIS — Z86010 Personal history of colonic polyps: Secondary | ICD-10-CM

## 2014-06-14 DIAGNOSIS — K5900 Constipation, unspecified: Secondary | ICD-10-CM

## 2014-06-14 DIAGNOSIS — K219 Gastro-esophageal reflux disease without esophagitis: Secondary | ICD-10-CM

## 2014-06-14 DIAGNOSIS — Z13220 Encounter for screening for lipoid disorders: Secondary | ICD-10-CM

## 2014-06-14 LAB — HM MAMMOGRAPHY: HM Mammogram: NEGATIVE

## 2014-06-14 NOTE — Progress Notes (Signed)
Pre visit review using our clinic review tool, if applicable. No additional management support is needed unless otherwise documented below in the visit note. 

## 2014-06-17 ENCOUNTER — Encounter: Payer: Self-pay | Admitting: Internal Medicine

## 2014-06-18 LAB — CYTOLOGY - PAP

## 2014-06-19 ENCOUNTER — Encounter: Payer: Self-pay | Admitting: *Deleted

## 2014-06-22 ENCOUNTER — Encounter: Payer: Self-pay | Admitting: Internal Medicine

## 2014-06-22 DIAGNOSIS — Z0000 Encounter for general adult medical examination without abnormal findings: Secondary | ICD-10-CM | POA: Insufficient documentation

## 2014-06-22 DIAGNOSIS — K5900 Constipation, unspecified: Secondary | ICD-10-CM | POA: Insufficient documentation

## 2014-06-22 DIAGNOSIS — F439 Reaction to severe stress, unspecified: Secondary | ICD-10-CM | POA: Insufficient documentation

## 2014-06-22 NOTE — Assessment & Plan Note (Signed)
Colonoscopy 03/01/07 revealed adenomatous polyp.  F/u colonoscopy 07/2012 - no polyps.

## 2014-06-22 NOTE — Progress Notes (Signed)
Patient ID: Angel Taylor, female   DOB: 1955/08/25, 59 y.o.   MRN: [id number]   Subjective:    Patient ID: Angel Taylor, female    DOB: 1955-12-23, 59 y.o.   MRN: [id number]  HPI  Patient here for her physical exam.  States she is doing better.  Stress is better.  Tries to stay active.  No cardiac symptoms with increased activity or exertion.  Breathing stable.  Eating and drinking well.  miralax working well for her.  Drinking more water.  Bowels better.     Past Medical History  Diagnosis Date  . GERD (gastroesophageal reflux disease)   . Anemia   . Chronic headaches   . Frequent UTI   . Pre-eclampsia     Current Outpatient Prescriptions on File Prior to Visit  Medication Sig Dispense Refill  . estradiol (ESTRACE) 0.1 MG/GM vaginal cream Use as directed 42.5 g 1  . pantoprazole (PROTONIX) 40 MG tablet Take 1 tablet (40 mg total) by mouth daily. 30 tablet 3   No current facility-administered medications on file prior to visit.    Review of Systems  Constitutional: Negative for appetite change and unexpected weight change.  HENT: Negative for congestion and sinus pressure.   Eyes: Negative for pain and visual disturbance.  Respiratory: Negative for cough, chest tightness and shortness of breath.   Cardiovascular: Negative for chest pain, palpitations and leg swelling.  Gastrointestinal: Positive for constipation (miralax working.  bowels better. ). Negative for nausea, vomiting, abdominal pain and diarrhea.  Genitourinary: Negative for frequency and difficulty urinating.  Musculoskeletal: Negative for back pain and joint swelling.  Skin: Negative for color change and rash.  Neurological: Negative for dizziness and headaches.  Hematological: Negative for adenopathy. Does not bruise/bleed easily.  Psychiatric/Behavioral: Negative for dysphoric mood and agitation.       Objective:    Physical Exam  Constitutional: She is oriented to person, place, and time. She appears  well-developed and well-nourished.  HENT:  Nose: Nose normal.  Mouth/Throat: Oropharynx is clear and moist.  Eyes: Right eye exhibits no discharge. Left eye exhibits no discharge. No scleral icterus.  Neck: Neck supple. No thyromegaly present.  Cardiovascular: Normal rate and regular rhythm.   Pulmonary/Chest: Breath sounds normal. No accessory muscle usage. No tachypnea. No respiratory distress. She has no decreased breath sounds. She has no wheezes. She has no rhonchi. Right breast exhibits no inverted nipple, no mass, no nipple discharge and no tenderness (no axillary adenopathy). Left breast exhibits no inverted nipple, no mass, no nipple discharge and no tenderness (no axilarry adenopathy).  Abdominal: Soft. Bowel sounds are normal. There is no tenderness.  Genitourinary:  Normal external genitalia.  Vaginal vault without lesions.  Cervix identified.  Pap smear performed.  Could not appreciate any adnexal masses or tenderness.    Musculoskeletal: She exhibits no edema or tenderness.  Lymphadenopathy:    She has no cervical adenopathy.  Neurological: She is alert and oriented to person, place, and time.  Skin: Skin is warm. No rash noted.  Psychiatric: She has a normal mood and affect. Her behavior is normal.    BP 133/73 mmHg  Pulse 61  Temp(Src) 98.1 F (36.7 C) (Oral)  Ht 5\' 1"  (1.549 m)  Wt 120 lb 4 oz (54.545 kg)  BMI 22.73 kg/m2  SpO2 100% Wt Readings from Last 3 Encounters:  06/14/14 120 lb 4 oz (54.545 kg)  05/17/14 119 lb 6 oz (54.148 kg)  10/15/13 114 lb 8 oz (  51.937 kg)     Lab Results  Component Value Date   WBC 5.4 09/05/2012   HGB 12.8 09/05/2012   HCT 38.6 09/05/2012   PLT 252.0 09/05/2012   GLUCOSE 87 05/30/2012   CHOL 214* 05/30/2012   TRIG 36.0 05/30/2012   HDL 84.20 05/30/2012   LDLDIRECT 119.7 05/30/2012   ALT 17 05/30/2012   AST 23 05/30/2012   NA 138 05/30/2012   K 4.6 05/30/2012   CL 104 05/30/2012   CREATININE 0.9 05/30/2012   BUN 16  05/30/2012   CO2 27 05/30/2012   TSH 0.97 05/30/2012       Assessment & Plan:   Problem List Items Addressed This Visit    Constipation    miralax helping.  Bowels doing better.        GERD (gastroesophageal reflux disease) - Primary    Reflux controlled.  On protonix.        Relevant Orders   CBC with Differential/Platelet   Comprehensive metabolic panel   TSH   Cytology - PAP (Completed)   Health care maintenance    Physical 06/14/14.  Mammogram 06/12/13 - Birads I.  She will schedule mammogram.  Colonoscopy 07/2012 - no polyps.        History of colonic polyps    Colonoscopy 03/01/07 revealed adenomatous polyp.  F/u colonoscopy 07/2012 - no polyps.        Relevant Orders   Cytology - PAP (Completed)   Stress    Handling stress well.  Stress is better.  Follow.         Other Visit Diagnoses    Screening cholesterol level        Relevant Orders    Lipid panel    Cytology - PAP (Completed)      I spent 25 minutes with the patient and more than 50% of the time was spent in consultation regarding the above.     Einar Pheasant, MD

## 2014-06-22 NOTE — Assessment & Plan Note (Signed)
Physical 06/14/14.  Mammogram 06/12/13 - Birads I.  She will schedule mammogram.  Colonoscopy 07/2012 - no polyps.

## 2014-06-22 NOTE — Assessment & Plan Note (Signed)
Reflux controlled.  On protonix.

## 2014-06-22 NOTE — Assessment & Plan Note (Signed)
Handling stress well.  Stress is better.  Follow.

## 2014-06-22 NOTE — Assessment & Plan Note (Signed)
miralax helping.  Bowels doing better.

## 2014-07-03 ENCOUNTER — Other Ambulatory Visit (INDEPENDENT_AMBULATORY_CARE_PROVIDER_SITE_OTHER): Payer: 59

## 2014-07-03 DIAGNOSIS — K219 Gastro-esophageal reflux disease without esophagitis: Secondary | ICD-10-CM

## 2014-07-03 DIAGNOSIS — Z13220 Encounter for screening for lipoid disorders: Secondary | ICD-10-CM | POA: Diagnosis not present

## 2014-07-03 LAB — COMPREHENSIVE METABOLIC PANEL
ALT: 14 U/L (ref 0–35)
AST: 21 U/L (ref 0–37)
Albumin: 4.2 g/dL (ref 3.5–5.2)
Alkaline Phosphatase: 58 U/L (ref 39–117)
BUN: 13 mg/dL (ref 6–23)
CHLORIDE: 102 meq/L (ref 96–112)
CO2: 29 meq/L (ref 19–32)
Calcium: 9.8 mg/dL (ref 8.4–10.5)
Creatinine, Ser: 0.89 mg/dL (ref 0.40–1.20)
GFR: 83.98 mL/min (ref >60.00)
Glucose, Bld: 95 mg/dL (ref 70–99)
Potassium: 4.4 mEq/L (ref 3.5–5.1)
Sodium: 136 mEq/L (ref 135–145)
Total Bilirubin: 0.7 mg/dL (ref 0.2–1.2)
Total Protein: 7.6 g/dL (ref 6.0–8.3)

## 2014-07-03 LAB — CBC WITH DIFFERENTIAL/PLATELET
Basophils Absolute: 0 10*3/uL (ref 0.0–0.1)
Basophils Relative: 0.6 % (ref 0.0–3.0)
Eosinophils Absolute: 0.1 10*3/uL (ref 0.0–0.7)
Eosinophils Relative: 2.4 % (ref 0.0–5.0)
HCT: 39.7 % (ref 36.0–46.0)
HEMOGLOBIN: 13.2 g/dL (ref 12.0–15.0)
LYMPHS PCT: 53.8 % — AB (ref 12.0–46.0)
Lymphs Abs: 2.8 10*3/uL (ref 0.7–4.0)
MCHC: 33.3 g/dL (ref 30.0–36.0)
MCV: 91 fl (ref 78.0–100.0)
MONO ABS: 0.4 10*3/uL (ref 0.1–1.0)
Monocytes Relative: 7.5 % (ref 3.0–12.0)
NEUTROS ABS: 1.9 10*3/uL (ref 1.4–7.7)
NEUTROS PCT: 35.7 % — AB (ref 43.0–77.0)
PLATELETS: 279 10*3/uL (ref 150.0–400.0)
RBC: 4.36 Mil/uL (ref 3.87–5.11)
RDW: 13.3 % (ref 11.5–15.5)
WBC: 5.2 10*3/uL (ref 4.0–10.5)

## 2014-07-03 LAB — LIPID PANEL
CHOL/HDL RATIO: 3
Cholesterol: 224 mg/dL — ABNORMAL HIGH (ref 0–200)
HDL: 79 mg/dL (ref >39.00)
LDL CALC: 132 mg/dL — AB (ref 0–99)
NonHDL: 145
Triglycerides: 67 mg/dL (ref 0.0–149.0)
VLDL: 13.4 mg/dL (ref 0.0–40.0)

## 2014-07-03 LAB — TSH: TSH: 2.3 u[IU]/mL (ref 0.35–4.50)

## 2014-07-04 ENCOUNTER — Encounter: Payer: Self-pay | Admitting: *Deleted

## 2014-07-28 NOTE — Discharge Summary (Signed)
PATIENT NAME:  Angel Taylor, Angel Taylor MR#:  [id number] DATE OF BIRTH:  [birth year]  DATE OF ADMISSION:  03/26/2011 DATE OF DISCHARGE:  03/27/2011  DISCHARGE DIAGNOSES:  1. Atypical chest pain, most likely nonischemic, and may be secondary to gastroesophageal reflux disease.  2. History of gastroesophageal reflux disease. 3. Degenerative disk disease with chronic neck pain.  4. History of colonic polyp.   CONSULTANT: Bartholome Bill, MD - Cardiology  HOSPITAL COURSE: This is a 59 year old female who has history of GERD. She takes a PPI at home. She presented with recurrent left-sided chest pain with some shortness of breath. She also had some heartburn and achy sensation with the chest pain. She also felt some anxiety. She was scheduled for an outpatient stress test, but because she presented with chest pain she was admitted. At the time of admission she had minimal elevation of her troponin when she came in with a troponin of 0.07. Her CPK and MB were normal; CK was 172 and MB 1.3. Her creatinine was normal at 1. She was admitted as non-STEMI and she was started on aspirin, beta blocker, and heparin drip, but repeat troponins have been negative. Her EKG when she came in showed that she has sinus rhythm, she has some Q waves in V2 and T wave inversions in V2. We have no prior EKG to compare with. A repeat EKG was done on 03/26/2011 which again showed some T wave inversions in lead V2 and some Q waves in V2, but no acute ischemic changes. Her repeat CPK, MB and troponins were negative. She had no further chest pain during the hospital stay. She ruled out for myocardial infarction. Cardiology suggested to do a functional study so the patient had a Myoview yesterday. The Myoview was reported as no evidence of ischemia. Chest pain appears to be nonischemic and no further cardiac work-up is needed at this time. Most likely her chest pain may be related to GERD. She is on a PPI and she can take Maalox or TUMS as needed for her  heartburn symptoms. At discharge her creatinine is normal at 0.83, her CBC is normal with hemoglobin of 11.3, normal white count of 5.5, and normal platelet count 234,000. She had a urinalysis done that was negative for any pyuria. She had a chest x-ray done, PA and lateral, which was negative. She is ambulating around. She has no further chest pain symptoms. I advised her to follow-up with Dr. Nicki Reaper and if she has any further chest pain she can be referred to Dr. Ubaldo Glassing as an outpatient.  DISCHARGE MEDICATIONS:  1. Protonix 40 mg daily.  2. TUMS 500 mg three times daily as needed.   CONDITION AT DISCHARGE: Comfortable. No chest pain. T-max 98.4, heart rate 71, blood pressure 111/68, and saturating 98% on room air. Chest is clear. Heart sounds are regular. Abdomen is soft and nontender. Murphy sign is negative.  If she has recurrence of chest pain then probably ultrasound of the abdomen can be done to rule out any gallbladder problems. Her Hemoglobin A1c was done in the hospital stay; it was 6.4, slightly elevated. LDL is 87. TSH is normal at 1.59. Her urine drug screen was negative.   DIET: Regular.   ACTIVITY: As tolerated.   DISCHARGE FOLLOWUP/INSTRUCTIONS: The patient can return to work on 04/08/2011. Followup with Dr. Nicki Reaper in one week. Follow up with [facility], Dr. Ubaldo Glassing, as needed.   TIME SPENT ON DISCHARGE: 40 minutes.  ____________________________ Mena Pauls, MD ag:slb  D: 03/27/2011 11:01:49 ET T: 03/28/2011 15:19:34 ET JOB#: [id number]  cc: Mena Pauls, MD, <Dictator> Einar Pheasant, MD Mena Pauls MD ELECTRONICALLY SIGNED 04/18/2011 20:54

## 2014-09-10 ENCOUNTER — Ambulatory Visit (INDEPENDENT_AMBULATORY_CARE_PROVIDER_SITE_OTHER): Payer: 59 | Admitting: Nurse Practitioner

## 2014-09-10 ENCOUNTER — Encounter: Payer: Self-pay | Admitting: Nurse Practitioner

## 2014-09-10 VITALS — BP 138/72 | HR 68 | Temp 98.0°F | Resp 12 | Ht 61.0 in | Wt 119.0 lb

## 2014-09-10 DIAGNOSIS — R21 Rash and other nonspecific skin eruption: Secondary | ICD-10-CM

## 2014-09-10 MED ORDER — TRIAMCINOLONE ACETONIDE 0.025 % EX OINT: 1.0000 "application " | TOPICAL_OINTMENT | Freq: Two times a day (BID) | CUTANEOUS | Status: DC

## 2014-09-10 MED ORDER — PREDNISONE 10 MG PO TABS: ORAL_TABLET | ORAL | Status: DC

## 2014-09-10 MED ORDER — ESTRADIOL 0.1 MG/GM VA CREA: TOPICAL_CREAM | VAGINAL | Status: DC

## 2014-09-10 NOTE — Patient Instructions (Signed)
Kenalog cream on areas to help with itching (neck and below)   Prednisone with breakfast  6 tablets on day 1 and day 2, 5 tablets on day 3 and day 4, 4 tablets on day 5 and day 6, 3 tablets day 7 and 8, 2 tablets day 9 and 10, 1 tablet day 11 and 12.   Do not take Aleve, naproxen, ibuprofen (NSAIDs) while on Prednisone.   Call if not helping by Friday.

## 2014-09-10 NOTE — Assessment & Plan Note (Signed)
Newest rash is generalized, found after weeding outside in the heat. She reports she does not remember pulling poison ivy/oak. Will try Prednisone taper over 12 days and kenalog ointment. Asked pt to follow up next week if no improvement.

## 2014-09-10 NOTE — Progress Notes (Signed)
   Subjective:    Patient ID: Angel Taylor, female    DOB: 19-Oct-1955, 58 y.o.   MRN: [id number]  HPI  Angel Taylor is a 59 yo female with a CC of neck rash and refill of vaginal cream.   1) Thursday out in the heat, felt itchy all over, pulled weeds out of flower bed  Papular, pruritic, spreading, denies systemic symptoms such as SOB, tongue/lips swelling, or trouble swallowing.   Treatment to date:  Benadryl- took when it first started Calamine- helped somewhat  No one else, denies changes to detergent   Review of Systems  Constitutional: Negative for fever, chills, diaphoresis and fatigue.  HENT: Negative for facial swelling.   Eyes: Negative for visual disturbance.  Gastrointestinal: Negative for nausea, vomiting and diarrhea.  Skin: Positive for rash.      Objective:   Physical Exam  Constitutional: She is oriented to person, place, and time. She appears well-developed and well-nourished. No distress.  BP 138/72 mmHg  Pulse 68  Temp(Src) 98 F (36.7 C)  Resp 12  Ht 5\' 1"  (1.549 m)  Wt 119 lb (53.978 kg)  BMI 22.50 kg/m2  SpO2 99%   HENT:  Head: Normocephalic and atraumatic.  Right Ear: External ear normal.  Left Ear: External ear normal.  Cardiovascular: Normal rate and regular rhythm.   Pulmonary/Chest: Effort normal and breath sounds normal. No respiratory distress. She has no wheezes. She has no rales. She exhibits no tenderness.  Neurological: She is alert and oriented to person, place, and time. No cranial nerve deficit. She exhibits normal muscle tone. Coordination normal.  Skin: Skin is warm and dry. No rash noted. She is not diaphoretic.     Psychiatric: She has a normal mood and affect. Her behavior is normal. Judgment and thought content normal.      Assessment & Plan:

## 2014-09-10 NOTE — Progress Notes (Signed)
Pre visit review using our clinic review tool, if applicable. No additional management support is needed unless otherwise documented below in the visit note. 

## 2014-12-12 ENCOUNTER — Ambulatory Visit: Payer: 59 | Admitting: Internal Medicine

## 2014-12-16 ENCOUNTER — Ambulatory Visit: Payer: 59 | Admitting: Internal Medicine

## 2015-02-03 ENCOUNTER — Ambulatory Visit: Payer: 59 | Admitting: Internal Medicine

## 2015-02-14 ENCOUNTER — Ambulatory Visit: Payer: 59 | Admitting: Internal Medicine

## 2015-07-01 ENCOUNTER — Encounter: Payer: 59 | Admitting: Internal Medicine

## 2015-07-16 ENCOUNTER — Other Ambulatory Visit: Payer: Self-pay | Admitting: Gastroenterology

## 2015-07-16 DIAGNOSIS — R1011 Right upper quadrant pain: Secondary | ICD-10-CM

## 2015-08-01 ENCOUNTER — Ambulatory Visit
Admission: RE | Admit: 2015-08-01 | Discharge: 2015-08-01 | Disposition: A | Discharge status: Home / Self Care | Payer: 59 | Source: Ambulatory Visit | Attending: Gastroenterology | Admitting: Gastroenterology

## 2015-08-01 DIAGNOSIS — R1011 Right upper quadrant pain: Secondary | ICD-10-CM | POA: Diagnosis not present

## 2015-08-01 DIAGNOSIS — K760 Fatty (change of) liver, not elsewhere classified: Secondary | ICD-10-CM | POA: Insufficient documentation

## 2016-01-04 ENCOUNTER — Other Ambulatory Visit: Payer: Self-pay | Admitting: Nurse Practitioner

## 2016-01-05 NOTE — Telephone Encounter (Signed)
Refilled 09/10/14. Pt last seen 09/10/15. Pt has cancelled 4 appointments and no showed 1 appointment with you. Please advise?

## 2016-01-06 ENCOUNTER — Other Ambulatory Visit: Payer: Self-pay | Admitting: Internal Medicine

## 2016-01-06 NOTE — Telephone Encounter (Signed)
Last seen 06/14/14, ok to fill?

## 2016-01-06 NOTE — Telephone Encounter (Signed)
Pt will need an appt before refilling the medication.  Please schedule.

## 2016-01-07 NOTE — Telephone Encounter (Signed)
Needs appt before refilling.  Please schedule.

## 2016-01-07 NOTE — Telephone Encounter (Signed)
Lm on vm for pt to schedule appt so she can get refill

## 2016-01-07 NOTE — Telephone Encounter (Signed)
Please make appointment soon for patient to get refill

## 2016-01-20 NOTE — Telephone Encounter (Signed)
Please schedule

## 2016-01-21 NOTE — Telephone Encounter (Signed)
[name] on vm to schedule appt

## 2016-01-21 NOTE — Telephone Encounter (Signed)
[name] on vm to schedule appt to get refill.Angel Taylor

## 2016-01-21 NOTE — Telephone Encounter (Signed)
I think this should have been sent to you.  Please schedule f/u appt.   Thanks

## 2016-01-23 NOTE — Telephone Encounter (Signed)
Pt called back and appt was scheduled 

## 2016-01-26 NOTE — Telephone Encounter (Signed)
appt scheduled

## 2016-02-23 ENCOUNTER — Ambulatory Visit: Payer: 59 | Admitting: Internal Medicine

## 2016-02-23 ENCOUNTER — Ambulatory Visit (INDEPENDENT_AMBULATORY_CARE_PROVIDER_SITE_OTHER): Payer: 59

## 2016-02-23 ENCOUNTER — Encounter: Payer: Self-pay | Admitting: Internal Medicine

## 2016-02-23 ENCOUNTER — Ambulatory Visit (INDEPENDENT_AMBULATORY_CARE_PROVIDER_SITE_OTHER): Payer: 59 | Admitting: Internal Medicine

## 2016-02-23 VITALS — BP 130/90 | HR 68 | Temp 98.1°F | Ht 61.0 in | Wt 117.2 lb

## 2016-02-23 DIAGNOSIS — K219 Gastro-esophageal reflux disease without esophagitis: Secondary | ICD-10-CM

## 2016-02-23 DIAGNOSIS — Z1231 Encounter for screening mammogram for malignant neoplasm of breast: Secondary | ICD-10-CM | POA: Diagnosis not present

## 2016-02-23 DIAGNOSIS — Z23 Encounter for immunization: Secondary | ICD-10-CM

## 2016-02-23 DIAGNOSIS — R300 Dysuria: Secondary | ICD-10-CM

## 2016-02-23 DIAGNOSIS — Z1239 Encounter for other screening for malignant neoplasm of breast: Secondary | ICD-10-CM

## 2016-02-23 DIAGNOSIS — R221 Localized swelling, mass and lump, neck: Secondary | ICD-10-CM

## 2016-02-23 DIAGNOSIS — Z86010 Personal history of colonic polyps: Secondary | ICD-10-CM

## 2016-02-23 DIAGNOSIS — H40059 Ocular hypertension, unspecified eye: Secondary | ICD-10-CM

## 2016-02-23 DIAGNOSIS — M542 Cervicalgia: Secondary | ICD-10-CM

## 2016-02-23 DIAGNOSIS — M5030 Other cervical disc degeneration, unspecified cervical region: Secondary | ICD-10-CM

## 2016-02-23 DIAGNOSIS — K760 Fatty (change of) liver, not elsewhere classified: Secondary | ICD-10-CM

## 2016-02-23 DIAGNOSIS — R030 Elevated blood-pressure reading, without diagnosis of hypertension: Secondary | ICD-10-CM

## 2016-02-23 LAB — URINALYSIS, ROUTINE W REFLEX MICROSCOPIC
Bilirubin Urine: NEGATIVE
HGB URINE DIPSTICK: NEGATIVE
Ketones, ur: NEGATIVE
LEUKOCYTES UA: NEGATIVE
Nitrite: NEGATIVE
PH: 6 (ref 5.0–8.0)
RBC / HPF: NONE SEEN (ref 0–?)
TOTAL PROTEIN, URINE-UPE24: NEGATIVE
URINE GLUCOSE: NEGATIVE
Urobilinogen, UA: 0.2 (ref 0.0–1.0)
WBC, UA: NONE SEEN (ref 0–?)

## 2016-02-23 MED ORDER — TIZANIDINE HCL 4 MG PO CAPS
4.0000 mg | ORAL_CAPSULE | Freq: Every evening | ORAL | 0 refills | Status: DC | PRN
Start: 2016-02-23 — End: 2018-07-07

## 2016-02-23 NOTE — Progress Notes (Signed)
Pre visit review using our clinic review tool, if applicable. No additional management support is needed unless otherwise documented below in the visit note. 

## 2016-02-23 NOTE — Progress Notes (Signed)
Patient ID: Angel Taylor, female   DOB: 06/23/1955, 60 y.o.   MRN: [id number]   Subjective:    Patient ID: Angel Taylor, female    DOB: 1955/11/27, 60 y.o.   MRN: [id number]  HPI  Patient here for a medication refill and follow up.  Last seen 06/14/14.  She was evaluated by GI for epigastric discomfort and nausea.  See GI note.  Evaluated 07/2015.  Had ultrasound.  Fatty liver.  On protonix.  Controlling upper symptoms.  No pain now.  Some slight decreased weight.  Is eating.  No vomiting.  Blood pressure has been elevated.  She also report she has been seeing opthalmology.  Elevated pressure in her eyes.  They are following.  Due f/u in 03/2016.  Reports increased neck pain.  Lying down is worse.  Turning her neck aggravates.  No significant pain radiating down her arms.  No numbness or tingling.  Does stretches in the am.     Past Medical History:  Diagnosis Date  . Anemia   . Chronic headaches   . Frequent UTI   . GERD (gastroesophageal reflux disease)   . Pre-eclampsia    Past Surgical History:  Procedure Laterality Date  . ABDOMINAL HYSTERECTOMY     total  . CESAREAN SECTION  1987  . TUBAL LIGATION  1987   Family History  Problem Relation Age of Onset  . Diabetes Mother   . Alzheimer's disease Mother   . Seizures Mother   . Heart disease Father    Social History   Social History  . Marital status: Married    Spouse name: N/A  . Number of children: 2  . Years of education: N/A   Social History Main Topics  . Smoking status: Never Smoker  . Smokeless tobacco: Never Used  . Alcohol use No  . Drug use: No  . Sexual activity: Not Asked   Other Topics Concern  . None   Social History Narrative  . None    Outpatient Encounter Prescriptions as of 02/23/2016  Medication Sig  . ESTRACE VAGINAL 0.1 MG/GM vaginal cream apply vaginally daily as directed by prescriber  . pantoprazole (PROTONIX) 40 MG tablet Take 1 tablet (40 mg total) by mouth daily.  . [DISCONTINUED]  predniSONE (DELTASONE) 10 MG tablet Take 6 tablets by mouth with breakfast for 2 days and decrease by 1 tablet every 2 days.  . [DISCONTINUED] triamcinolone (KENALOG) 0.025 % ointment Apply 1 application topically 2 (two) times daily.  Marland Kitchen tiZANidine (ZANAFLEX) 4 MG capsule Take 1 capsule (4 mg total) by mouth at bedtime as needed for muscle spasms.   No facility-administered encounter medications on file as of 02/23/2016.     Review of Systems  Constitutional: Negative for appetite change and unexpected weight change.  HENT: Negative for congestion and sinus pressure.   Respiratory: Negative for cough, chest tightness and shortness of breath.   Cardiovascular: Negative for chest pain, palpitations and leg swelling.  Gastrointestinal: Negative for abdominal pain, diarrhea, nausea and vomiting.  Genitourinary: Negative for difficulty urinating and dysuria.  Musculoskeletal: Positive for neck pain. Negative for back pain and joint swelling.  Skin: Negative for color change and rash.  Neurological: Negative for dizziness, light-headedness and headaches.  Psychiatric/Behavioral: Negative for agitation and dysphoric mood.       Objective:    Physical Exam  Constitutional: She appears well-developed and well-nourished. No distress.  HENT:  Nose: Nose normal.  Mouth/Throat: Oropharynx is clear and moist.  Neck: Neck supple. No thyromegaly present.  Right neck nodule.    Cardiovascular: Normal rate and regular rhythm.   Pulmonary/Chest: Breath sounds normal. No respiratory distress. She has no wheezes.  Abdominal: Soft. Bowel sounds are normal. There is no tenderness.  Musculoskeletal: She exhibits no edema or tenderness.  Increased pain with rotation of her neck.  Some limited rom.    Skin: No rash noted. No erythema.  Psychiatric: She has a normal mood and affect. Her behavior is normal.    BP 130/90   Pulse 68   Temp 98.1 F (36.7 C) (Oral)   Ht 5\' 1"  (1.549 m)   Wt 117 lb 3.2 oz  (53.2 kg)   SpO2 99%   BMI 22.14 kg/m  Wt Readings from Last 3 Encounters:  02/23/16 117 lb 3.2 oz (53.2 kg)  09/10/14 119 lb (54 kg)  06/14/14 120 lb 4 oz (54.5 kg)     Lab Results  Component Value Date   WBC 5.2 07/03/2014   HGB 13.2 07/03/2014   HCT 39.7 07/03/2014   PLT 279.0 07/03/2014   GLUCOSE 95 07/03/2014   CHOL 224 (H) 07/03/2014   TRIG 67.0 07/03/2014   HDL 79.00 07/03/2014   LDLDIRECT 119.7 05/30/2012   LDLCALC 132 (H) 07/03/2014   ALT 14 07/03/2014   AST 21 07/03/2014   NA 136 07/03/2014   K 4.4 07/03/2014   CL 102 07/03/2014   CREATININE 0.89 07/03/2014   BUN 13 07/03/2014   CO2 29 07/03/2014   TSH 2.30 07/03/2014    US Abdomen Limited Ruq  Result Date: 08/01/2015 CLINICAL DATA:  Right upper quadrant pain. EXAM: US ABDOMEN LIMITED - RIGHT UPPER QUADRANT COMPARISON:  None. FINDINGS: Gallbladder: No gallstones or wall thickening visualized. No sonographic Murphy sign noted by sonographer. Common bile duct: Diameter: 2 mm Liver: Liver is slightly echogenic consistent fatty infiltration and/or hepatocellular disease. Portal vein is patent with normal directional flow. IMPRESSION: 1. Liver is slightly echogenic consistent fatty infiltration and/or hepatocellular disease. 2. No acute or focal abnormality. Electronically Signed   By: Marcello Moores  Register   On: 08/01/2015 10:52       Assessment & Plan:   Problem List Items Addressed This Visit    Degenerative disc disease, cervical    Previous c-spine xray revealed loss of the normal cervical lordosis and DDD.  Trial of muscle relaxer.  Check c-spine.  Follow.  Exercise and massage therapy helped in the past.        Relevant Medications   tiZANidine (ZANAFLEX) 4 MG capsule   Elevated blood pressure reading    Blood pressure elevated.  Have her spot check her pressure.  Get her back in soon to reassess.  Follow pressures.  Follow metabolic panel.        Relevant Orders   Basic metabolic panel   Fatty liver     Ultrasound revealed fatty liver.  Follow liver function tests.        Relevant Orders   Hepatic function panel   Lipid panel   GERD (gastroesophageal reflux disease)    Doing well on protonix.  Follow.        Relevant Orders   CBC with Differential/Platelet   History of colonic polyps    Colonoscopy 07/2012 - no polyps.  Follow.        Increased pressure in the eye    Followed by opthalmology.        Neck nodule    Neck nodule.  Wanted  to refer to ENT.  She declines.  Follow.  Notify me if changes her mind.        Relevant Orders   TSH   Neck pain    Increased neck pain as outlined.  Limited rom.  No significant radicular symptoms.  Check c-spine xray.  Trial of a muscle relaxer.  Follow.        Relevant Orders   DG Cervical Spine 2 or 3 views (Completed)    Other Visit Diagnoses    Breast cancer screening    -  Primary   Relevant Orders   MM DIGITAL SCREENING BILATERAL   Dysuria       Relevant Orders   Urinalysis, Routine w reflex microscopic (not at [facility]) (Completed)   CULTURE, URINE COMPREHENSIVE (Completed)   Encounter for immunization       Relevant Medications   tiZANidine (ZANAFLEX) 4 MG capsule   Other Relevant Orders   DG Cervical Spine 2 or 3 views (Completed)   MM DIGITAL SCREENING BILATERAL   Urinalysis, Routine w reflex microscopic (not at [facility]) (Completed)   CULTURE, URINE COMPREHENSIVE (Completed)   Flu Vaccine QUAD 36+ mos IM (Completed)       Einar Pheasant, MD

## 2016-02-26 LAB — CULTURE, URINE COMPREHENSIVE

## 2016-02-28 ENCOUNTER — Other Ambulatory Visit: Payer: Self-pay | Admitting: Internal Medicine

## 2016-02-28 DIAGNOSIS — M542 Cervicalgia: Secondary | ICD-10-CM

## 2016-02-28 NOTE — Progress Notes (Signed)
Order placed for referral to physical therapy.

## 2016-02-29 ENCOUNTER — Encounter: Payer: Self-pay | Admitting: Internal Medicine

## 2016-02-29 DIAGNOSIS — I10 Essential (primary) hypertension: Secondary | ICD-10-CM | POA: Insufficient documentation

## 2016-02-29 DIAGNOSIS — R221 Localized swelling, mass and lump, neck: Secondary | ICD-10-CM | POA: Insufficient documentation

## 2016-02-29 DIAGNOSIS — R030 Elevated blood-pressure reading, without diagnosis of hypertension: Secondary | ICD-10-CM | POA: Insufficient documentation

## 2016-02-29 DIAGNOSIS — K760 Fatty (change of) liver, not elsewhere classified: Secondary | ICD-10-CM | POA: Insufficient documentation

## 2016-02-29 DIAGNOSIS — H40059 Ocular hypertension, unspecified eye: Secondary | ICD-10-CM | POA: Insufficient documentation

## 2016-02-29 NOTE — Assessment & Plan Note (Signed)
Blood pressure elevated.  Have her spot check her pressure.  Get her back in soon to reassess.  Follow pressures.  Follow metabolic panel.

## 2016-02-29 NOTE — Assessment & Plan Note (Signed)
Doing well on protonix.  Follow.   

## 2016-02-29 NOTE — Assessment & Plan Note (Signed)
Colonoscopy 07/2012 - no polyps.  Follow.

## 2016-02-29 NOTE — Assessment & Plan Note (Signed)
Neck nodule.  Wanted to refer to ENT.  She declines.  Follow.  Notify me if changes her mind.

## 2016-02-29 NOTE — Assessment & Plan Note (Signed)
Increased neck pain as outlined.  Limited rom.  No significant radicular symptoms.  Check c-spine xray.  Trial of a muscle relaxer.  Follow.

## 2016-02-29 NOTE — Assessment & Plan Note (Signed)
Followed by opthalmology.       

## 2016-02-29 NOTE — Assessment & Plan Note (Signed)
Ultrasound revealed fatty liver.  Follow liver function tests.

## 2016-02-29 NOTE — Assessment & Plan Note (Signed)
Previous c-spine xray revealed loss of the normal cervical lordosis and DDD.  Trial of muscle relaxer.  Check c-spine.  Follow.  Exercise and massage therapy helped in the past.

## 2016-03-01 ENCOUNTER — Encounter: Payer: Self-pay | Admitting: Internal Medicine

## 2016-03-10 ENCOUNTER — Other Ambulatory Visit (INDEPENDENT_AMBULATORY_CARE_PROVIDER_SITE_OTHER): Payer: 59

## 2016-03-10 ENCOUNTER — Ambulatory Visit: Payer: 59 | Attending: Internal Medicine

## 2016-03-10 VITALS — BP 146/86 | HR 69

## 2016-03-10 DIAGNOSIS — K219 Gastro-esophageal reflux disease without esophagitis: Secondary | ICD-10-CM

## 2016-03-10 DIAGNOSIS — M542 Cervicalgia: Secondary | ICD-10-CM | POA: Diagnosis present

## 2016-03-10 DIAGNOSIS — R221 Localized swelling, mass and lump, neck: Secondary | ICD-10-CM

## 2016-03-10 DIAGNOSIS — G8929 Other chronic pain: Secondary | ICD-10-CM | POA: Insufficient documentation

## 2016-03-10 DIAGNOSIS — M25512 Pain in left shoulder: Secondary | ICD-10-CM | POA: Diagnosis present

## 2016-03-10 DIAGNOSIS — K760 Fatty (change of) liver, not elsewhere classified: Secondary | ICD-10-CM

## 2016-03-10 DIAGNOSIS — R030 Elevated blood-pressure reading, without diagnosis of hypertension: Secondary | ICD-10-CM

## 2016-03-10 NOTE — Addendum Note (Signed)
Addended by: Leeanne Rio on: 03/10/2016 10:30 AM   Modules accepted: Orders

## 2016-03-10 NOTE — Patient Instructions (Signed)
  Pt was recommended to maintain proper posture (scapular retraction and depression with chin tuck) throughout the day. Pt demonstrated and verbalized understanding.

## 2016-03-10 NOTE — Therapy (Signed)
[facility] [facility] [address] [place], [address] Phone: [phone]   Fax:  [phone]  Physical Therapy Evaluation  Patient Details  Name: Angel Taylor MRN: [id number] Date of Birth: 12-26-1955 Referring Provider: Einar Pheasant, MD  Encounter Date: 03/10/2016      PT End of Session - 03/10/16 0808    Visit Number 1   Number of Visits 13   Date for PT Re-Evaluation 04/22/16   PT Start Time 0808   PT Stop Time 0908   PT Time Calculation (min) 60 min   Activity Tolerance Patient tolerated treatment well   Behavior During Therapy [facility] for tasks assessed/performed      Past Medical History:  Diagnosis Date  . Anemia   . Chronic headaches   . Frequent UTI   . GERD (gastroesophageal reflux disease)   . Pre-eclampsia     Past Surgical History:  Procedure Laterality Date  . ABDOMINAL HYSTERECTOMY     total  . CESAREAN SECTION  1987  . TUBAL LIGATION  1987    Vitals:   03/10/16 0816  BP: (!) 146/86  Pulse: 69         Subjective Assessment - 03/10/16 0817    Subjective neck pain level (neck pain extends to both shoulders L > R): 8/10 with neck movement, 5/10 neck pain currently (no movement and at neutral position), 9/10 at worst, 5/10 at best.    Pertinent History Neck pain. Pain also goes down her shoulders. Turning her head bothers her such as when she is driving, or reaching back with her arm L > R. Looking down and up and tilting her head side to side also bothers her neck. Using a contour pillow for her neck helps. Laying on her L side, her hands might go to sleep. No unexplained changes in weight. Had an MRI a couple of years ago for her neck which revealed degenerative changes and did PT which helped (years ago) her movement. Did not take her pain away completely. Had an x-ray recently which revealed spurs at her neck.    Patient Stated Goals reach back with her L arm better, move more freely    Currently in Pain? Yes   Pain Score 5    Pain Location Neck  neck; pain extends to both shoulders (L > R)   Pain Orientation Posterior   Pain Descriptors / Indicators Burning;Sharp;Aching   Pain Type Chronic pain   Pain Onset More than a month ago   Pain Frequency Constant   Aggravating Factors  cervical rotation, flexion, extension, side bending, (L > R), reaching back with her arm L > R   Pain Relieving Factors neutral position            [facility] PT Assessment - 03/10/16 0826      Assessment   Medical Diagnosis Neck pain   Referring Provider Einar Pheasant, MD   Onset Date/Surgical Date 02/28/16  Date PT referrel signed. Chronic condition.    Hand Dominance Right   Next MD Visit January 2018   Prior Therapy Pt had prior PT for her neck which helped with movement.      Precautions   Precaution Comments No known precautions.      Restrictions   Other Position/Activity Restrictions No known restrictions     Balance Screen   Has the patient fallen in the past 6 months No   Has the patient had a decrease in activity  level because of a fear of falling?  No   Is the patient reluctant to leave their home because of a fear of falling?  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 Difficulty performing VBI test secondary to neck pain. (-) Alar ligament test   Neck Disability Index  30%     Posture/Postural Control   Posture Comments bilaterally protracted shoulders and neck, movement crease at C5/C6. Slight R cervical rotation at around C5/C6 joint     AROM   Cervical Flexion WFL with reproduction  of L UT/rhomboid discomfort and cervical spine symptoms    Cervical Extension WFL with mid thoracic discomfort   Cervical - Right Side [facility] with L posterior UT area symptoms with slight R neck pain   Cervical - Left Side [facility] with R posterior UT area symptoms with slight L neck pain   Cervical - Right Rotation WFL with L scapular burning sensation with slight R neck pain   Cervical - Left Rotation WFL with L UT area pain and L scapular pain (inferior angle area).      Strength   Right Shoulder Flexion 4+/5   Right Shoulder ABduction 4+/5   Left Shoulder Flexion 5/5   Left Shoulder ABduction 4+/5   Right Elbow Flexion 4+/5   Right Elbow Extension 4/5   Left Elbow Flexion 4+/5   Left Elbow Extension 4-/5     Palpation   Palpation comment slight L rotation at T1, slight R rotation at T2, increased L rhomboid and L upper trap muscle tension. Muscle tension R upper trap/rhomboid area. Stiffness palpated L C2, C3, C4 transverse process. TTP L T2/T3 area transverse process      Objectives   Manual therapy  STM L upper trap   Slight decrease in neck pain with L cervical rotation   Pt education on scapular retraction with depression as well as chin tuck to help with posture and decrease UT muscle use.                       PT Education - 03/10/16 1242    Education provided Yes   Education Details plan of care, posture   Person(s) Educated Patient   Methods Explanation;Demonstration;Verbal cues   Comprehension Returned demonstration;Verbalized understanding             PT Long Term Goals - 03/10/16 1244      PT LONG TERM GOAL #1   Title Patient will have a decrease in neck pain to 5/10 or less at worst to promote ability to turn her head, look in different directions as well as reach with her L arm.    Baseline 9/10 at worst   Time 6   Status New     PT LONG TERM GOAL #2   Title Patient will improve her  Neck Disability Index Score by at least  10% as a demonstration of improved function.    Baseline 30%   Time 6   Period Weeks   Status New     PT  LONG TERM GOAL #3   Title Patient will be able to perform cervical AROM all planes with minimal to no complain of pain/symptoms to promote ability to turn her head, look around, and reach.    Time 6   Period Weeks   Status New               Plan - 03/10/16 1237    Clinical Impression Statement Patient is a 60 year old female who came to physical therapy secondary to neck pain. She also presents with reproduction of symptoms with cervical AROM; bilateral upper trap and rhomboid area muscle tension, TTP/stiffness to cervical vertebrae, poor posture, and difficulty performing functional tasks such as turning her head and reaching due to her symptoms. Patient will benefit from skilled physical therapy intervention to address the aforementioned deficits.    Rehab Potential Good   Clinical Impairments Affecting Rehab Potential pain, chronicity of condition    PT Frequency 2x / week   PT Duration 6 weeks   PT Treatment/Interventions Manual techniques;Therapeutic exercise;Therapeutic activities;Dry needling;Neuromuscular re-education;Patient/family education;Ultrasound;Electrical Stimulation;Traction   PT Next Visit Plan manual therapy, scapular strengthening, posture, modalities PRN   Consulted and Agree with Plan of Care Patient      Patient will benefit from skilled therapeutic intervention in order to improve the following deficits and impairments:  Pain, Postural dysfunction, Improper body mechanics, Decreased strength  Visit Diagnosis: Cervicalgia - Plan: PT plan of care cert/re-cert  Chronic left shoulder pain - Plan: PT plan of care cert/re-cert     Problem List Patient Active Problem List   Diagnosis Date Noted  . Fatty liver 02/29/2016  . Elevated blood pressure reading 02/29/2016  . Increased pressure in the eye 02/29/2016  . Neck nodule 02/29/2016  . Neck pain  02/23/2016  . Constipation 06/22/2014  . Stress 06/22/2014  . Health care maintenance 06/22/2014  . Rash 05/20/2014  . Cough 05/20/2014  . Fullness of breast 05/20/2014  . Costochondritis 05/11/2013  . URI (upper respiratory infection) 04/29/2013  . Degenerative disc disease, cervical 06/11/2012  . History of colonic polyps 06/11/2012  . GERD (gastroesophageal reflux disease) 06/11/2012    Joneen Boers PT, DPT  03/10/2016, 1:26 PM  [facility] [address] [place], [address] Phone: [phone]   Fax:  [phone]  Name: Guylene Jentzsch MRN: [id number] Date of Birth: [birth year]

## 2016-03-12 ENCOUNTER — Other Ambulatory Visit (INDEPENDENT_AMBULATORY_CARE_PROVIDER_SITE_OTHER): Payer: 59

## 2016-03-12 DIAGNOSIS — R030 Elevated blood-pressure reading, without diagnosis of hypertension: Secondary | ICD-10-CM

## 2016-03-12 DIAGNOSIS — R221 Localized swelling, mass and lump, neck: Secondary | ICD-10-CM

## 2016-03-12 DIAGNOSIS — K760 Fatty (change of) liver, not elsewhere classified: Secondary | ICD-10-CM | POA: Diagnosis not present

## 2016-03-12 LAB — BASIC METABOLIC PANEL
BUN: 14 mg/dL (ref 6–23)
CHLORIDE: 101 meq/L (ref 96–112)
CO2: 28 mEq/L (ref 19–32)
Calcium: 9.6 mg/dL (ref 8.4–10.5)
Creatinine, Ser: 0.87 mg/dL (ref 0.40–1.20)
GFR: 85.71 mL/min (ref >60.00)
Glucose, Bld: 89 mg/dL (ref 70–99)
POTASSIUM: 4.3 meq/L (ref 3.5–5.1)
SODIUM: 138 meq/L (ref 135–145)

## 2016-03-12 LAB — CBC WITH DIFFERENTIAL/PLATELET
BASOS ABS: 0 10*3/uL (ref 0.0–0.1)
Basophils Relative: 0.4 % (ref 0.0–3.0)
EOS ABS: 0.1 10*3/uL (ref 0.0–0.7)
Eosinophils Relative: 1.4 % (ref 0.0–5.0)
HCT: 39.8 % (ref 36.0–46.0)
Hemoglobin: 13.2 g/dL (ref 12.0–15.0)
LYMPHS ABS: 3.1 10*3/uL (ref 0.7–4.0)
LYMPHS PCT: 50.4 % — AB (ref 12.0–46.0)
MCHC: 33.3 g/dL (ref 30.0–36.0)
MCV: 91.5 fl (ref 78.0–100.0)
MONO ABS: 0.4 10*3/uL (ref 0.1–1.0)
Monocytes Relative: 7.3 % (ref 3.0–12.0)
NEUTROS ABS: 2.5 10*3/uL (ref 1.4–7.7)
NEUTROS PCT: 40.5 % — AB (ref 43.0–77.0)
PLATELETS: 264 10*3/uL (ref 150.0–400.0)
RBC: 4.35 Mil/uL (ref 3.87–5.11)
RDW: 13.3 % (ref 11.5–15.5)
WBC: 6.1 10*3/uL (ref 4.0–10.5)

## 2016-03-12 LAB — HEPATIC FUNCTION PANEL
ALT: 12 U/L (ref 0–35)
AST: 16 U/L (ref 0–37)
Albumin: 4.6 g/dL (ref 3.5–5.2)
Alkaline Phosphatase: 60 U/L (ref 39–117)
BILIRUBIN DIRECT: 0.1 mg/dL (ref 0.0–0.3)
TOTAL PROTEIN: 7.4 g/dL (ref 6.0–8.3)
Total Bilirubin: 0.7 mg/dL (ref 0.2–1.2)

## 2016-03-12 LAB — TSH: TSH: 1.16 u[IU]/mL (ref 0.35–4.50)

## 2016-03-12 LAB — LIPID PANEL
CHOLESTEROL: 228 mg/dL — AB (ref 0–200)
HDL: 79.9 mg/dL (ref >39.00)
LDL CALC: 138 mg/dL — AB (ref 0–99)
NonHDL: 148.17
TRIGLYCERIDES: 53 mg/dL (ref 0.0–149.0)
Total CHOL/HDL Ratio: 3
VLDL: 10.6 mg/dL (ref 0.0–40.0)

## 2016-03-13 ENCOUNTER — Encounter: Payer: Self-pay | Admitting: Internal Medicine

## 2016-03-15 ENCOUNTER — Ambulatory Visit: Payer: 59

## 2016-03-15 DIAGNOSIS — M25512 Pain in left shoulder: Secondary | ICD-10-CM

## 2016-03-15 DIAGNOSIS — M542 Cervicalgia: Secondary | ICD-10-CM

## 2016-03-15 DIAGNOSIS — G8929 Other chronic pain: Secondary | ICD-10-CM

## 2016-03-15 NOTE — Therapy (Signed)
[place] [facility] [address], [place], [address] Phone: [phone]   Fax:  [phone]  Physical Therapy Treatment  Patient Details  Name: Angel Taylor MRN: [id number] Date of Birth: 15-Nov-1955 Referring Provider: Einar Pheasant, MD  Encounter Date: 03/15/2016      PT End of Session - 03/15/16 1002    Visit Number 2   Number of Visits 13   Date for PT Re-Evaluation 04/22/16   PT Start Time [id number]   PT Stop Time 1050   PT Time Calculation (min) 48 min   Activity Tolerance Patient tolerated treatment well   Behavior During Therapy [facility] for tasks assessed/performed      Past Medical History:  Diagnosis Date  . Anemia   . Chronic headaches   . Frequent UTI   . GERD (gastroesophageal reflux disease)   . Pre-eclampsia     Past Surgical History:  Procedure Laterality Date  . ABDOMINAL HYSTERECTOMY     total  . CESAREAN SECTION  1987  . TUBAL LIGATION  1987    There were no vitals filed for this visit.      Subjective Assessment - 03/15/16 1002    Subjective Neck is doing ok. Turning to the L bothers her neck. 7/10 neck pain currently when turnign to the L   Pertinent History Neck pain. Pain also goes down her shoulders. Turning her head bothers her such as when she is driving, or reaching back with her arm L > R. Looking down and up and tilting her head side to side also bothers her neck. Using a contour pillow for her neck helps. Laying on her L side, her hands might go to sleep. No unexplained changes in weight. Had an MRI a couple of years ago for her neck which revealed degenerative changes and did PT which helped (years ago) her movement. Did not take her pain away completely. Had an x-ray recently which revealed spurs at her neck.    Patient Stated Goals reach back with her L arm better, move more freely   Currently in Pain? Yes   Pain Score 7    Pain Onset More than a month ago      Objectives    Manual therapy  STM L upper trap STM R upper trap and R rhomboid muscle area STM L upper trap/scalene muscle area              Slight decrease in neck pain with L cervical rotation    There-ex  Directed patient with seated manually resisted scapular retraction targeting lower trap   10x2 with 5 second holds for the L  10x2 with 5 second holds for the R  L first rib stretch with cervical rotation 10x3 each side for rotation  Decreased neck pain with L cervical rotation  R first rib stretch with cervical rotation 10x3 each side for rotation   standing bilateral shoulder extension resisting yellow band 10x3 with scapular retraction and abdominal muslce use.   Improved exercise technique, movement at target joints, use of target muscles after mod verbal, visual, tactile cues.     Decreased neck pain with L cervical rotation with manual therapy and exercises to decrease upper trap L > R and scalene muscle tension.                        PT Education - 03/15/16 1054    Education provided Yes  Education Details ther-ex, HEP   Person(s) Educated Patient   Methods Explanation;Demonstration;Tactile cues;Verbal cues;Handout   Comprehension Verbalized understanding;Returned demonstration             PT Long Term Goals - 03/10/16 1244      PT LONG TERM GOAL #1   Title Patient will have a decrease in neck pain to 5/10 or less at worst to promote ability to turn her head, look in different directions as well as reach with her L arm.    Baseline 9/10 at worst   Time 6   Status New     PT LONG TERM GOAL #2   Title Patient will improve her  Neck Disability Index Score by at least 10% as a demonstration of improved function.    Baseline 30%   Time 6   Period Weeks   Status New     PT LONG TERM GOAL #3   Title Patient will be able to perform cervical AROM all planes with minimal to no complain of pain/symptoms to promote ability to turn her head, look  around, and reach.    Time 6   Period Weeks   Status New               Plan - 03/15/16 1054    Clinical Impression Statement Decreased neck pain with L cervical rotation with manual therapy and exercises to decrease upper trap L > R and scalene muscle tension.    Rehab Potential Good   Clinical Impairments Affecting Rehab Potential pain, chronicity of condition    PT Frequency 2x / week   PT Duration 6 weeks   PT Treatment/Interventions Manual techniques;Therapeutic exercise;Therapeutic activities;Dry needling;Neuromuscular re-education;Patient/family education;Ultrasound;Electrical Stimulation;Traction   PT Next Visit Plan manual therapy, scapular strengthening, posture, modalities PRN   Consulted and Agree with Plan of Care Patient      Patient will benefit from skilled therapeutic intervention in order to improve the following deficits and impairments:  Pain, Postural dysfunction, Improper body mechanics, Decreased strength  Visit Diagnosis: Cervicalgia  Chronic left shoulder pain     Problem List Patient Active Problem List   Diagnosis Date Noted  . Fatty liver 02/29/2016  . Elevated blood pressure reading 02/29/2016  . Increased pressure in the eye 02/29/2016  . Neck nodule 02/29/2016  . Neck pain 02/23/2016  . Constipation 06/22/2014  . Stress 06/22/2014  . Health care maintenance 06/22/2014  . Rash 05/20/2014  . Cough 05/20/2014  . Fullness of breast 05/20/2014  . Costochondritis 05/11/2013  . URI (upper respiratory infection) 04/29/2013  . Degenerative disc disease, cervical 06/11/2012  . History of colonic polyps 06/11/2012  . GERD (gastroesophageal reflux disease) 06/11/2012   Joneen Boers PT, DPT   03/15/2016, 10:57 AM  [facility] [facility] [address] [place], [address] Phone: [phone]   Fax:  [phone]  Name: Angel Taylor MRN: [id number] Date of Birth: [birth year]

## 2016-03-15 NOTE — Patient Instructions (Addendum)
Strengthening: Resisted Extension    Hold band, one in each hand, arms forward. Pull arms back, elbow straight. Hold for 0-5 seconds Repeat ___10_ times per set. Do _3___ sets per session. Do _1__ sessions per day.  http://orth.exer.us/832   Copyright  VHI. All rights reserved.   Reviewed and given R and L 1st rib stretch with 10x3 cervical rotations daily as part of her HEP. Pt demonstrated and verbalized understanding. Handout provided.

## 2016-03-17 ENCOUNTER — Ambulatory Visit: Payer: 59

## 2016-03-18 ENCOUNTER — Ambulatory Visit: Payer: 59

## 2016-03-18 DIAGNOSIS — M542 Cervicalgia: Secondary | ICD-10-CM

## 2016-03-18 DIAGNOSIS — G8929 Other chronic pain: Secondary | ICD-10-CM

## 2016-03-18 DIAGNOSIS — M25512 Pain in left shoulder: Secondary | ICD-10-CM

## 2016-03-18 NOTE — Therapy (Signed)
[facility] [address], [place], [address] Phone: [phone]   Fax:  [phone]  Physical Therapy Treatment  Patient Details  Name: Angel Taylor MRN: [id number] Date of Birth: 03-Jan-1956 Referring Provider: Einar Pheasant, MD  Encounter Date: 03/18/2016      PT End of Session - 03/18/16 1057    Visit Number 3   Number of Visits 13   Date for PT Re-Evaluation 04/22/16   PT Start Time 1057  pt arrived late   PT Stop Time 1144   PT Time Calculation (min) 47 min   Activity Tolerance Patient tolerated treatment well   Behavior During Therapy [facility] for tasks assessed/performed      Past Medical History:  Diagnosis Date  . Anemia   . Chronic headaches   . Frequent UTI   . GERD (gastroesophageal reflux disease)   . Pre-eclampsia     Past Surgical History:  Procedure Laterality Date  . ABDOMINAL HYSTERECTOMY     total  . CESAREAN SECTION  1987  . TUBAL LIGATION  1987    There were no vitals filed for this visit.      Subjective Assessment - 03/18/16 1059    Subjective Neck is a little more flexible. Not as painful when turning her head to the L and to the R. 5-6/10 neck pain if she pushes the L rotation motion.    Pertinent History Neck pain. Pain also goes down her shoulders. Turning her head bothers her such as when she is driving, or reaching back with her arm L > R. Looking down and up and tilting her head side to side also bothers her neck. Using a contour pillow for her neck helps. Laying on her L side, her hands might go to sleep. No unexplained changes in weight. Had an MRI a couple of years ago for her neck which revealed degenerative changes and did PT which helped (years ago) her movement. Did not take her pain away completely. Had an x-ray recently which revealed spurs at her neck.    Patient Stated Goals reach back with her L arm better, move more freely   Currently in Pain? Yes    Pain Score 6    Pain Onset More than a month ago                                 PT Education - 03/18/16 1129    Education provided Yes   Education Details ther-ex, HEP   Person(s) Educated Patient   Methods Explanation;Demonstration;Tactile cues;Verbal cues;Handout   Comprehension Returned demonstration;Verbalized understanding        Objectives   Manual therapy  Prone STM to L rhomboid muscle area. Improved lower cervical and upper thoracic vertebral neural alignment palpated. Decreased neck pain.  STM L upper trap/scalene muscle area      There-ex  Directed patient with seated chin tucks with scapular retraction 10x2 with 5 seconds  Then with gentle manual resistance targeting L posterior cervical muscles (secondary to more muscle tone R posterior cervical muscles) 10x2 with 5 second holds.   standing bilateral shoulder extension resisting yellow band 10x2 with 5 second holds with scapular retraction and abdominal muslce use.    L first rib stretch with cervical rotation 10x2 each side for rotation  Then with R cervical side bend 10x5 seconds for 3 sets.   Improved exercise technique,  movement at target joints, use of target muscles after mod verbal, visual, tactile cues.     Decreased neck pain and improved vertebral neutral alignment after soft tissue mobilization to the L rhomboid muscle area. Worked on cervical and scapular strengthening as well as improving L scalene muscle flexibility. 4-5/10 neck pain with L cervical rotation after session.          PT Long Term Goals - 03/10/16 1244      PT LONG TERM GOAL #1   Title Patient will have a decrease in neck pain to 5/10 or less at worst to promote ability to turn her head, look in different directions as well as reach with her L arm.    Baseline 9/10 at worst   Time 6   Status New     PT LONG TERM GOAL #2   Title Patient will improve her  Neck Disability Index Score by at  least 10% as a demonstration of improved function.    Baseline 30%   Time 6   Period Weeks   Status New     PT LONG TERM GOAL #3   Title Patient will be able to perform cervical AROM all planes with minimal to no complain of pain/symptoms to promote ability to turn her head, look around, and reach.    Time 6   Period Weeks   Status New               Plan - 03/18/16 1134    Clinical Impression Statement Decreased neck pain and improved vertebral neutral alignment after soft tissue mobilization to the L rhomboid muscle area. Worked on cervical and scapular strengthening as well as improving L scalene muscle flexibility. 4-5/10 neck pain with L cervical rotation after session.    Rehab Potential Good   Clinical Impairments Affecting Rehab Potential pain, chronicity of condition    PT Frequency 2x / week   PT Duration 6 weeks   PT Treatment/Interventions Manual techniques;Therapeutic exercise;Therapeutic activities;Dry needling;Neuromuscular re-education;Patient/family education;Ultrasound;Electrical Stimulation;Traction   PT Next Visit Plan manual therapy, scapular strengthening, posture, modalities PRN   Consulted and Agree with Plan of Care Patient      Patient will benefit from skilled therapeutic intervention in order to improve the following deficits and impairments:  Pain, Postural dysfunction, Improper body mechanics, Decreased strength  Visit Diagnosis: Cervicalgia  Chronic left shoulder pain     Problem List Patient Active Problem List   Diagnosis Date Noted  . Fatty liver 02/29/2016  . Elevated blood pressure reading 02/29/2016  . Increased pressure in the eye 02/29/2016  . Neck nodule 02/29/2016  . Neck pain 02/23/2016  . Constipation 06/22/2014  . Stress 06/22/2014  . Health care maintenance 06/22/2014  . Rash 05/20/2014  . Cough 05/20/2014  . Fullness of breast 05/20/2014  . Costochondritis 05/11/2013  . URI (upper respiratory infection) 04/29/2013   . Degenerative disc disease, cervical 06/11/2012  . History of colonic polyps 06/11/2012  . GERD (gastroesophageal reflux disease) 06/11/2012   Joneen Boers PT, DPT   03/18/2016, 11:50 AM  [facility] [address], [place], [address] Phone: [phone]   Fax:  [phone]  Name: Bekki Man MRN: [id number] Date of Birth: [birth year]

## 2016-03-18 NOTE — Patient Instructions (Signed)
  Tilt your head to the right side to feel a stretch at the left side of your neck while the towel is providing downward pressure to your left shoulder.   Hold for 5-10 seconds for 10 times, 3 sets daily.

## 2016-03-22 ENCOUNTER — Ambulatory Visit: Payer: 59

## 2016-03-22 DIAGNOSIS — M25512 Pain in left shoulder: Secondary | ICD-10-CM

## 2016-03-22 DIAGNOSIS — G8929 Other chronic pain: Secondary | ICD-10-CM

## 2016-03-22 DIAGNOSIS — M542 Cervicalgia: Secondary | ICD-10-CM | POA: Diagnosis not present

## 2016-03-22 NOTE — Therapy (Signed)
[facility] [address] [place], [address] Phone: [phone]   Fax:  [phone]  Physical Therapy Treatment  Patient Details  Name: Angel Taylor MRN: [id number] Date of Birth: [birth year] Referring Provider: Einar Pheasant, MD  Encounter Date: 03/22/2016      PT End of Session - 03/22/16 1040    Visit Number 4   Number of Visits 13   Date for PT Re-Evaluation 04/22/16   PT Start Time 1042  pt arrived late and used restroom   PT Stop Time 1113   PT Time Calculation (min) 31 min   Activity Tolerance Patient tolerated treatment well   Behavior During Therapy [facility] for tasks assessed/performed      Past Medical History:  Diagnosis Date  . Anemia   . Chronic headaches   . Frequent UTI   . GERD (gastroesophageal reflux disease)   . Pre-eclampsia     Past Surgical History:  Procedure Laterality Date  . ABDOMINAL HYSTERECTOMY     total  . CESAREAN SECTION  1987  . TUBAL LIGATION  1987    There were no vitals filed for this visit.      Subjective Assessment - 03/22/16 1043    Subjective Neck is ok. Kind of stiff. Does not know if its the way she slept. Loosened up after she worked on it a little bit. 5/10 maybe (currently).   Pertinent History Neck pain. Pain also goes down her shoulders. Turning her head bothers her such as when she is driving, or reaching back with her arm L > R. Looking down and up and tilting her head side to side also bothers her neck. Using a contour pillow for her neck helps. Laying on her L side, her hands might go to sleep. No unexplained changes in weight. Had an MRI a couple of years ago for her neck which revealed degenerative changes and did PT which helped (years ago) her movement. Did not take her pain away completely. Had an x-ray recently which revealed spurs at her neck.    Patient Stated Goals reach back with her L arm better, move more freely   Currently in Pain?  Yes   Pain Score 5    Pain Onset More than a month ago                                 PT Education - 03/22/16 1102    Education provided Yes   Education Details ther-ex   [employer]) Educated Patient   Methods Demonstration;Explanation;Tactile cues;Verbal cues   Comprehension Returned demonstration;Verbalized understanding        Objectives   Manual therapy  seated STM to L rhomboid muscle area. Improved lower cervical and upper thoracic vertebral neural alignment palpated. Decreased neck pain.  STM L upper trap/scalene muscle area  Decreased pain     There-ex  Directed patient with standing R shoulder extension with scapular retraction resisting yellow band 10x5 seconds for 2 sets  Decreased neck pain with L cervical rotation  seated chin tucks with scapular retraction 10x with 5 seconds with gentle manual resistance targeting L posterior cervical muscles  Then both posterior cervical muscles at the same time 10x5 seconds  standing bilateral shoulder extension resisting yellow band 10x2 with 5 second holds with scapular retraction.    Improved exercise technique, movement at target joints, use of target muscles after mod  verbal, visual, tactile cues.     Decreased L cervical pain after STM to L rhomboid and scalene muscles, as well as exercises to promote R scapular strength. Decreased neck pain to 4/10 after session. Pt demonstrates L rotation at C7 area in which more neutral alignment palpated after session.         PT Long Term Goals - 03/10/16 1244      PT LONG TERM GOAL #1   Title Patient will have a decrease in neck pain to 5/10 or less at worst to promote ability to turn her head, look in different directions as well as reach with her L arm.    Baseline 9/10 at worst   Time 6   Status New     PT LONG TERM GOAL #2   Title Patient will improve her  Neck Disability Index Score by at least 10% as a demonstration of  improved function.    Baseline 30%   Time 6   Period Weeks   Status New     PT LONG TERM GOAL #3   Title Patient will be able to perform cervical AROM all planes with minimal to no complain of pain/symptoms to promote ability to turn her head, look around, and reach.    Time 6   Period Weeks   Status New               Plan - 03/22/16 1111    Clinical Impression Statement Decreased L cervical pain after STM to L rhomboid and scalene muscles, as well as exercises to promote R scapular strength. Decreased neck pain to 4/10 after session. Pt demonstrates L rotation at C7 area in which more neutral alignment palpated after session.    Rehab Potential Good   Clinical Impairments Affecting Rehab Potential pain, chronicity of condition    PT Frequency 2x / week   PT Duration 6 weeks   PT Treatment/Interventions Manual techniques;Therapeutic exercise;Therapeutic activities;Dry needling;Neuromuscular re-education;Patient/family education;Ultrasound;Electrical Stimulation;Traction   PT Next Visit Plan manual therapy, scapular strengthening, posture, modalities PRN   Consulted and Agree with Plan of Care Patient      Patient will benefit from skilled therapeutic intervention in order to improve the following deficits and impairments:  Pain, Postural dysfunction, Improper body mechanics, Decreased strength  Visit Diagnosis: Cervicalgia  Chronic left shoulder pain     Problem List Patient Active Problem List   Diagnosis Date Noted  . Fatty liver 02/29/2016  . Elevated blood pressure reading 02/29/2016  . Increased pressure in the eye 02/29/2016  . Neck nodule 02/29/2016  . Neck pain 02/23/2016  . Constipation 06/22/2014  . Stress 06/22/2014  . Health care maintenance 06/22/2014  . Rash 05/20/2014  . Cough 05/20/2014  . Fullness of breast 05/20/2014  . Costochondritis 05/11/2013  . URI (upper respiratory infection) 04/29/2013  . Degenerative disc disease, cervical  06/11/2012  . History of colonic polyps 06/11/2012  . GERD (gastroesophageal reflux disease) 06/11/2012   Joneen Boers PT, DPT   03/22/2016, 1:02 PM  [facility] [address] [place], [address] Phone: [phone]   Fax:  [phone]  Name: Angel Taylor MRN: [id number] Date of Birth: 09/07/1955

## 2016-03-24 ENCOUNTER — Ambulatory Visit: Payer: 59

## 2016-03-30 ENCOUNTER — Ambulatory Visit: Payer: 59

## 2016-03-30 DIAGNOSIS — M542 Cervicalgia: Secondary | ICD-10-CM

## 2016-03-30 DIAGNOSIS — M25512 Pain in left shoulder: Secondary | ICD-10-CM

## 2016-03-30 DIAGNOSIS — G8929 Other chronic pain: Secondary | ICD-10-CM

## 2016-03-30 NOTE — Therapy (Signed)
[facility] [address] [place], [address] Phone: [phone]   Fax:  [phone]  Physical Therapy Treatment  Patient Details  Name: Angel Taylor MRN: [id number] Date of Birth: 1955-10-02 Referring Provider: Einar Pheasant, MD  Encounter Date: 03/30/2016      PT End of Session - 03/30/16 1559    Visit Number 5   Number of Visits 13   Date for PT Re-Evaluation 04/22/16   PT Start Time [id number]   PT Stop Time 1631  30 min session secondary to pt had to leave for [place].   PT Time Calculation (min) 32 min   Activity Tolerance Patient tolerated treatment well   Behavior During Therapy [facility] for tasks assessed/performed      Past Medical History:  Diagnosis Date  . Anemia   . Chronic headaches   . Frequent UTI   . GERD (gastroesophageal reflux disease)   . Pre-eclampsia     Past Surgical History:  Procedure Laterality Date  . ABDOMINAL HYSTERECTOMY     total  . CESAREAN SECTION  1987  . TUBAL LIGATION  1987    There were no vitals filed for this visit.      Subjective Assessment - 03/30/16 1600    Subjective Pt states that she has to be in [place] afterwards. Neck is doing pretty good. A little bit more flexibility with turning her head side to side. 4/10 neck pain mostly when turning her head to the L. Most of the pain now is when she is tilting her head side to side. Used to be more when turning her head.    Pertinent History Neck pain. Pain also goes down her shoulders. Turning her head bothers her such as when she is driving, or reaching back with her arm L > R. Looking down and up and tilting her head side to side also bothers her neck. Using a contour pillow for her neck helps. Laying on her L side, her hands might go to sleep. No unexplained changes in weight. Had an MRI a couple of years ago for her neck which revealed degenerative changes and did PT which helped (years ago) her  movement. Did not take her pain away completely. Had an x-ray recently which revealed spurs at her neck.    Patient Stated Goals reach back with her L arm better, move more freely   Currently in Pain? Yes   Pain Score 4    Pain Onset More than a month ago                                 PT Education - 03/30/16 1657    Education provided Yes   Education Details ther-ex   [employer]) Educated Patient   Methods Explanation;Demonstration;Tactile cues;Verbal cues   Comprehension Returned demonstration;Verbalized understanding        Objectives   Manual therapy  seated STM to L rhomboid muscle area.    STM L upper trap/scalene muscle area            There-ex  Directed patient with seated manually resisted L scapular retraction targeting L lower trap 10x2 with 5 seconds   Seated manually resisted L cervical rotation isometrics 10x2 with 5 second holds. Decreased neck pain  Seated manually resisted gentle R cervical side bend isometrics 10x2 with 5 second holds.   Decreased neck pain.    Improved  exercise technique, movement at target joints, use of target muscles after mod verbal, visual, tactile cues.     3-4/10 neck pain after session. Decreased symptoms with exercises that help decrease L scalene muscle use, decrease L upper trap muscle use, and gently increasing L posterior cervical muscle use.            PT Long Term Goals - 03/10/16 1244      PT LONG TERM GOAL #1   Title Patient will have a decrease in neck pain to 5/10 or less at worst to promote ability to turn her head, look in different directions as well as reach with her L arm.    Baseline 9/10 at worst   Time 6   Status New     PT LONG TERM GOAL #2   Title Patient will improve her  Neck Disability Index Score by at least 10% as a demonstration of improved function.    Baseline 30%   Time 6   Period Weeks   Status New     PT LONG TERM GOAL #3   Title Patient will  be able to perform cervical AROM all planes with minimal to no complain of pain/symptoms to promote ability to turn her head, look around, and reach.    Time 6   Period Weeks   Status New               Plan - 03/30/16 1658    Clinical Impression Statement 3-4/10 neck pain after session. Decreased symptoms with exercises that help decrease L scalene muscle use, decrease L upper trap muscle use, and gently increasing L posterior cervical muscle use.    Rehab Potential Good   Clinical Impairments Affecting Rehab Potential pain, chronicity of condition    PT Frequency 2x / week   PT Duration 6 weeks   PT Treatment/Interventions Manual techniques;Therapeutic exercise;Therapeutic activities;Dry needling;Neuromuscular re-education;Patient/family education;Ultrasound;Electrical Stimulation;Traction   PT Next Visit Plan manual therapy, scapular strengthening, posture, modalities PRN   Consulted and Agree with Plan of Care Patient      Patient will benefit from skilled therapeutic intervention in order to improve the following deficits and impairments:  Pain, Postural dysfunction, Improper body mechanics, Decreased strength  Visit Diagnosis: Cervicalgia  Chronic left shoulder pain     Problem List Patient Active Problem List   Diagnosis Date Noted  . Fatty liver 02/29/2016  . Elevated blood pressure reading 02/29/2016  . Increased pressure in the eye 02/29/2016  . Neck nodule 02/29/2016  . Neck pain 02/23/2016  . Constipation 06/22/2014  . Stress 06/22/2014  . Health care maintenance 06/22/2014  . Rash 05/20/2014  . Cough 05/20/2014  . Fullness of breast 05/20/2014  . Costochondritis 05/11/2013  . URI (upper respiratory infection) 04/29/2013  . Degenerative disc disease, cervical 06/11/2012  . History of colonic polyps 06/11/2012  . GERD (gastroesophageal reflux disease) 06/11/2012   Joneen Boers PT, DPT   03/30/2016, 5:07 PM  [facility] [address], [place], [address] Phone: [phone]   Fax:  [phone]  Name: Kapri Hurless MRN: [id number] Date of Birth: [birth year]

## 2016-04-07 ENCOUNTER — Other Ambulatory Visit: Payer: Self-pay | Admitting: Internal Medicine

## 2016-04-07 ENCOUNTER — Ambulatory Visit
Admission: RE | Admit: 2016-04-07 | Discharge: 2016-04-07 | Disposition: A | Discharge status: Home / Self Care | Payer: 59 | Source: Ambulatory Visit | Attending: Internal Medicine | Admitting: Internal Medicine

## 2016-04-07 DIAGNOSIS — Z1231 Encounter for screening mammogram for malignant neoplasm of breast: Secondary | ICD-10-CM | POA: Insufficient documentation

## 2016-04-07 DIAGNOSIS — Z1239 Encounter for other screening for malignant neoplasm of breast: Secondary | ICD-10-CM

## 2016-04-14 ENCOUNTER — Ambulatory Visit: Payer: 59 | Attending: Internal Medicine

## 2016-04-14 DIAGNOSIS — M25512 Pain in left shoulder: Secondary | ICD-10-CM | POA: Diagnosis present

## 2016-04-14 DIAGNOSIS — M542 Cervicalgia: Secondary | ICD-10-CM | POA: Insufficient documentation

## 2016-04-14 DIAGNOSIS — G8929 Other chronic pain: Secondary | ICD-10-CM

## 2016-04-14 NOTE — Therapy (Signed)
[facility] [address] [place], [address] Phone: [phone]   Fax:  [phone]  Physical Therapy Treatment  Patient Details  Name: Angel Taylor MRN: [id number] Date of Birth: [birth year] Referring Provider: Einar Pheasant, MD  Encounter Date: 04/14/2016      PT End of Session - 04/14/16 1349    Visit Number 6   Number of Visits 13   Date for PT Re-Evaluation 04/22/16   PT Start Time [id number]   PT Stop Time 1437   PT Time Calculation (min) 48 min   Activity Tolerance Patient tolerated treatment well   Behavior During Therapy [facility] for tasks assessed/performed      Past Medical History:  Diagnosis Date  . Anemia   . Chronic headaches   . Frequent UTI   . GERD (gastroesophageal reflux disease)   . Pre-eclampsia     Past Surgical History:  Procedure Laterality Date  . ABDOMINAL HYSTERECTOMY     total  . CESAREAN SECTION  1987  . TUBAL LIGATION  1987    There were no vitals filed for this visit.      Subjective Assessment - 04/14/16 1352    Subjective Pt states adding an eye drop medication in addition to her current medications. Neck is doing pretty good. Feels pain in L shoulder blade. Turning is better. Tilting her head side to side tisll bothers her.  5-6/10 neck pain currently.    Pertinent History Neck pain. Pain also goes down her shoulders. Turning her head bothers her such as when she is driving, or reaching back with her arm L > R. Looking down and up and tilting her head side to side also bothers her neck. Using a contour pillow for her neck helps. Laying on her L side, her hands might go to sleep. No unexplained changes in weight. Had an MRI a couple of years ago for her neck which revealed degenerative changes and did PT which helped (years ago) her movement. Did not take her pain away completely. Had an x-ray recently which revealed spurs at her neck.    Patient Stated Goals reach back  with her L arm better, move more freely   Currently in Pain? Yes   Pain Score 6   5-6/10   Pain Onset More than a month ago                                 PT Education - 04/14/16 1410    Education provided Yes   Education Details ther-ex   [employer]) Educated Patient   Methods Explanation;Demonstration;Tactile cues;Verbal cues   Comprehension Returned demonstration;Verbalized understanding        Objectives   Manual therapy  seatedSTM to L rhomboid muscle area.    STM L upper trap/scalene muscle area   Decreased neck pain to 4/10 afterwards  STM R posterior cervical paraspinals   Feels better turning and tilting head    There-ex  Directed patient with seated manually resisted L scapular retraction targeting L lower trap 10x2 with 5 seconds   R cervical side bend to stretch L neck 15 seconds x 5   L upper trap stretch 15 seconds x 5   Seated manually resisted L cervical rotation isometrics 10x2 with 5 second holds.   Supine cervical nodding 10x3 for gentle anterior cervical muscle activation  Standing bilateral scapular retraction resisting red band  10x2. Cues to decrease use of L upper trap muscle.    Improved exercise technique, movement at target joints, use of target muscles after mod verbal, visual, tactile cues.     Decreased neck pain following manual therapy to decrease L upper trap, L scalene and R posterior cervical muscle tension. Continued working on scapular strengthening to help decrease L upper trap use, and cervical stretching to decrease muscle tension.       PT Long Term Goals - 03/10/16 1244      PT LONG TERM GOAL #1   Title Patient will have a decrease in neck pain to 5/10 or less at worst to promote ability to turn her head, look in different directions as well as reach with her L arm.    Baseline 9/10 at worst   Time 6   Status New     PT LONG TERM GOAL #2   Title Patient will improve  her  Neck Disability Index Score by at least 10% as a demonstration of improved function.    Baseline 30%   Time 6   Period Weeks   Status New     PT LONG TERM GOAL #3   Title Patient will be able to perform cervical AROM all planes with minimal to no complain of pain/symptoms to promote ability to turn her head, look around, and reach.    Time 6   Period Weeks   Status New               Plan - 04/14/16 1414    Clinical Impression Statement Decreased neck pain following manual therapy to decrease L upper trap, L scalene and R posterior cervical muscle tension. Continued working on scapular strengthening to help decrease L upper trap use, and cervical stretching to decrease muscle tension.   Rehab Potential Good   Clinical Impairments Affecting Rehab Potential pain, chronicity of condition    PT Frequency 2x / week   PT Duration 6 weeks   PT Treatment/Interventions Manual techniques;Therapeutic exercise;Therapeutic activities;Dry needling;Neuromuscular re-education;Patient/family education;Ultrasound;Electrical Stimulation;Traction   PT Next Visit Plan manual therapy, scapular strengthening, posture, modalities PRN   Consulted and Agree with Plan of Care Patient      Patient will benefit from skilled therapeutic intervention in order to improve the following deficits and impairments:  Pain, Postural dysfunction, Improper body mechanics, Decreased strength  Visit Diagnosis: Cervicalgia  Chronic left shoulder pain     Problem List Patient Active Problem List   Diagnosis Date Noted  . Fatty liver 02/29/2016  . Elevated blood pressure reading 02/29/2016  . Increased pressure in the eye 02/29/2016  . Neck nodule 02/29/2016  . Neck pain 02/23/2016  . Constipation 06/22/2014  . Stress 06/22/2014  . Health care maintenance 06/22/2014  . Rash 05/20/2014  . Cough 05/20/2014  . Fullness of breast 05/20/2014  . Costochondritis 05/11/2013  . URI (upper respiratory  infection) 04/29/2013  . Degenerative disc disease, cervical 06/11/2012  . History of colonic polyps 06/11/2012  . GERD (gastroesophageal reflux disease) 06/11/2012   Joneen Boers PT, DPT   04/14/2016, 2:44 PM  [facility] [address], [place], [address] Phone: [phone]   Fax:  [phone]  Name: Angel Taylor MRN: [id number] Date of Birth: [birth year]

## 2016-04-19 ENCOUNTER — Ambulatory Visit: Payer: 59

## 2016-04-19 DIAGNOSIS — M542 Cervicalgia: Secondary | ICD-10-CM | POA: Diagnosis not present

## 2016-04-19 DIAGNOSIS — G8929 Other chronic pain: Secondary | ICD-10-CM

## 2016-04-19 DIAGNOSIS — M25512 Pain in left shoulder: Secondary | ICD-10-CM

## 2016-04-19 NOTE — Therapy (Signed)
[facility] [address] [place], [address] Phone: [phone]   Fax:  [phone]  Physical Therapy Treatment  Patient Details  Name: Angel Taylor MRN: [id number] Date of Birth: 07/02/1955 Referring Provider: Einar Pheasant, MD  Encounter Date: 04/19/2016      PT End of Session - 04/19/16 1457    Visit Number 7   Number of Visits 21   Date for PT Re-Evaluation 05/20/16   PT Start Time [id number]  pt arrived late   PT Stop Time 1532   PT Time Calculation (min) 34 min   Activity Tolerance Patient tolerated treatment well   Behavior During Therapy [facility] for tasks assessed/performed      Past Medical History:  Diagnosis Date  . Anemia   . Chronic headaches   . Frequent UTI   . GERD (gastroesophageal reflux disease)   . Pre-eclampsia     Past Surgical History:  Procedure Laterality Date  . ABDOMINAL HYSTERECTOMY     total  . CESAREAN SECTION  1987  . TUBAL LIGATION  1987    There were no vitals filed for this visit.      Subjective Assessment - 04/19/16 1459    Subjective Neck feels pretty good. Turning side to side is better. Looking down and tilting hurts a little more. I do have more movement in my neck.  4-5/10 neck pain currently and 5/10 at most for the past 7 days.  Reaching back with her L arm is better.  Pt states that she feels pleased with the mobility. Still has some pain.    Pertinent History Neck pain. Pain also goes down her shoulders. Turning her head bothers her such as when she is driving, or reaching back with her arm L > R. Looking down and up and tilting her head side to side also bothers her neck. Using a contour pillow for her neck helps. Laying on her L side, her hands might go to sleep. No unexplained changes in weight. Had an MRI a couple of years ago for her neck which revealed degenerative changes and did PT which helped (years ago) her movement. Did not take her pain away  completely. Had an x-ray recently which revealed spurs at her neck.    Patient Stated Goals reach back with her L arm better, move more freely   Currently in Pain? Yes   Pain Score 5   4-5/10   Pain Onset More than a month ago            [facility] PT Assessment - 04/19/16 1557      Observation/Other Assessments   Neck Disability Index  30%                             PT Education - 04/19/16 1528    Education provided Yes   Education Details ther-ex, HEP, plan of care   Person(s) Educated Patient   Methods Explanation;Demonstration;Tactile cues;Verbal cues;Handout   Comprehension Returned demonstration;Verbalized understanding        Objectives   Manual therapy  seatedSTM to L rhomboid muscle area.  then to R rhomboid area  decreased pain with cervical side bending.   STM R posterior cervical paraspinals in supine  decreased pain with cervical side bending.               There-ex  Directed patient with supine chin tucks 10x2 with 5  seconds   Reviewed plan of care: 2x/week for 4 weeks.   L upper trap stretch 15 seconds x 5    Reviewed HEP. Please see patient instructions. Pt demonstrated and verbalized understanding.     Improved exercise technique, movement at target joints, use of target muscles after mod verbal, visual, tactile cues.   Pt demonstrates improved neck pain level at worst since initial evaluation as well as improved comfort level when turning her head both sides as well as reaching back with her L arm. Main symptom reproduction per patient reports now is cervical side bend (bilaterally) and cervical flexion. Patient still demonstrates neck pain, discomfort with head movements, and difficulty performing functional tasks and would benefit from continued skilled physical therapy services to address the aforementioned deficits.       PT Long Term Goals - 04/19/16 1559      PT LONG TERM GOAL #1   Title Patient will  have a decrease in neck pain to 5/10 or less at worst to promote ability to turn her head, look in different directions as well as reach with her L arm.    Baseline 9/10 at worst; 5/10 at worst for the past 7 days (04/19/2016)   Time 4   Status On-going     PT LONG TERM GOAL #2   Title Patient will improve her  Neck Disability Index Score by at least 10% as a demonstration of improved function.    Baseline 30%; 30% (04/20/2015)   Time 6   Period Weeks   Status On-going     PT LONG TERM GOAL #3   Title Patient will be able to perform cervical AROM all planes with minimal to no complain of pain/symptoms to promote ability to turn her head, look around, and reach.    Time 6   Period Weeks   Status On-going               Plan - 04/19/16 1559    Clinical Impression Statement Pt demonstrates improved neck pain level at worst since initial evaluation as well as improved comfort level when turning her head both sides as well as reaching back with her L arm. Main symptom reproduction per patient reports now is cervical side bend (bilaterally) and cervical flexion. Patient still demonstrates neck pain, discomfort with head movements, and difficulty performing functional tasks and would benefit from continued skilled physical therapy services to address the aforementioned deficits.    Rehab Potential Good   Clinical Impairments Affecting Rehab Potential pain, chronicity of condition    PT Frequency 2x / week   PT Duration 4 weeks   PT Treatment/Interventions Manual techniques;Therapeutic exercise;Therapeutic activities;Dry needling;Neuromuscular re-education;Patient/family education;Ultrasound;Electrical Stimulation;Traction   PT Next Visit Plan manual therapy, scapular strengthening, posture, modalities PRN   Consulted and Agree with Plan of Care Patient      Patient will benefit from skilled therapeutic intervention in order to improve the following deficits and impairments:  Pain,  Postural dysfunction, Improper body mechanics, Decreased strength  Visit Diagnosis: Cervicalgia - Plan: PT plan of care cert/re-cert  Chronic left shoulder pain - Plan: PT plan of care cert/re-cert     Problem List Patient Active Problem List   Diagnosis Date Noted  . Fatty liver 02/29/2016  . Elevated blood pressure reading 02/29/2016  . Increased pressure in the eye 02/29/2016  . Neck nodule 02/29/2016  . Neck pain 02/23/2016  . Constipation 06/22/2014  . Stress 06/22/2014  . Health care maintenance 06/22/2014  .  Rash 05/20/2014  . Cough 05/20/2014  . Fullness of breast 05/20/2014  . Costochondritis 05/11/2013  . URI (upper respiratory infection) 04/29/2013  . Degenerative disc disease, cervical 06/11/2012  . History of colonic polyps 06/11/2012  . GERD (gastroesophageal reflux disease) 06/11/2012    Joneen Boers PT, DPT   04/19/2016, 4:15 PM  [facility] [address], [place], [address] Phone: [phone]   Fax:  [phone]  Name: Jennet Legg MRN: [id number] Date of Birth: 10/09/1955

## 2016-04-19 NOTE — Patient Instructions (Addendum)
  Upper Cervical Flexion / Extension  Do not follow first picture  You can perform this on your back.  Gently flex and extend upper neck by nodding head. Try to make a "long neck". Hold ___5_ seconds. Repeat _10___ times per set. Do 3____ sets per session. Do ___1_ sessions per day.  http://orth.exer.us/351   Copyright  VHI. All rights reserved.     Flexibility: Upper Trapezius Stretch    Gently grasp right side of head while reaching behind back with other hand. Tilt head away until a gentle stretch is felt (you can look into your right pant pocket). Hold _15___ seconds. Repeat _5___ times per set. Do ___1_ sets per session. Do _2___ sessions per day.  http://orth.exer.us/341   Copyright  VHI. All rights reserved.

## 2016-04-21 ENCOUNTER — Ambulatory Visit: Payer: 59

## 2016-04-26 ENCOUNTER — Ambulatory Visit: Payer: 59

## 2016-04-26 DIAGNOSIS — M542 Cervicalgia: Secondary | ICD-10-CM | POA: Diagnosis not present

## 2016-04-26 DIAGNOSIS — G8929 Other chronic pain: Secondary | ICD-10-CM

## 2016-04-26 DIAGNOSIS — M25512 Pain in left shoulder: Secondary | ICD-10-CM

## 2016-04-26 NOTE — Patient Instructions (Signed)
  Lying on your back:   Gently nod your head.    Repeat 10 times for 3 sets daily.

## 2016-04-26 NOTE — Therapy (Signed)
[facility] [address] [place], [address] Phone: [phone]   Fax:  [phone]  Physical Therapy Treatment  Patient Details  Name: Angel Taylor MRN: [id number] Date of Birth: [birth year] Referring Provider: Einar Pheasant, MD  Encounter Date: 04/26/2016      PT End of Session - 04/26/16 1127    Visit Number 8   Number of Visits 21   Date for PT Re-Evaluation 05/20/16   PT Start Time 1127   PT Stop Time 1212   PT Time Calculation (min) 45 min   Activity Tolerance Patient tolerated treatment well   Behavior During Therapy [facility] for tasks assessed/performed      Past Medical History:  Diagnosis Date  . Anemia   . Chronic headaches   . Frequent UTI   . GERD (gastroesophageal reflux disease)   . Pre-eclampsia     Past Surgical History:  Procedure Laterality Date  . ABDOMINAL HYSTERECTOMY     total  . CESAREAN SECTION  1987  . TUBAL LIGATION  1987    There were no vitals filed for this visit.      Subjective Assessment - 04/26/16 1129    Subjective Neck is ok. Feels a burning sensation (4.5/10)  L upper trap area when turning her head to the L when driving. Just a dull pain in neutral position (2.5/10 ). Tilting to the R does not hurt quite as bad as it did but she still feels some tightness on the L side.  Pt states that her lower back has been hurting on the L side yesterday.   Pertinent History Neck pain. Pain also goes down her shoulders. Turning her head bothers her such as when she is driving, or reaching back with her arm L > R. Looking down and up and tilting her head side to side also bothers her neck. Using a contour pillow for her neck helps. Laying on her L side, her hands might go to sleep. No unexplained changes in weight. Had an MRI a couple of years ago for her neck which revealed degenerative changes and did PT which helped (years ago) her movement. Did not take her pain away  completely. Had an x-ray recently which revealed spurs at her neck.    Patient Stated Goals reach back with her L arm better, move more freely   Currently in Pain? Yes   Pain Score 5   4.5/10 with L rotation   Pain Onset More than a month ago       Objectives   Manual therapy  STM L rhomboid, L upper trap, L scalene muscles. Decreased pain with L cervical rotation. STM R cervical paraspinals in supine. Decreased R paraspinal muscle tension Suboccipital release. Decreased R suboccipital muscle tension Gentle manual cervical traction   There-ex  Directed patient with seated L cervical rotation with R scapular retraction 3x   Decreased neck pain, more neutral C7 palpated   Then with gentle manual resistance from PT for scapular retraction 10x2   Supine cervical nodding 10x3 to decrease suboccipital muscle tension   Pt also was recommended to sit with lumbar towel roll when sitting at her desk (decreased back pain) as well as sitting up straight at work with resting her feet onto a foot stool (or surface such as 1-2 pillows) to help decrease pressure to her low back, especially if sitting with her back not supported. Pt was also recommended to use a chair with  lower arm rests so she can slide it under her desk so she can sit with her back supported with the back rest (her current chair has arm rests that keep her chair further away from her desk because the arm rests keeps her chair from sliding under her table). Pt demonstrated and verbalized understanding.     Improved exercise technique, movement at target joints, use of target muscles after mod verbal, visual, tactile cues.     Decreaed neck pain with  L rotation and with R and L side bending to 3/10 after session. Tense R suboccipital muscles and R paraspinal muscles. Decreased tension after manual therapy. Improved C7 neutral position after activation of R scapular muscles and following STM to L rhomboid muscles.                      PT Education - 04/26/16 1229    Education provided Yes   Education Details ther-ex, HEP   Person(s) Educated Patient   Methods Explanation;Demonstration;Tactile cues;Verbal cues;Handout   Comprehension Returned demonstration;Verbalized understanding             PT Long Term Goals - 04/19/16 1559      PT LONG TERM GOAL #1   Title Patient will have a decrease in neck pain to 5/10 or less at worst to promote ability to turn her head, look in different directions as well as reach with her L arm.    Baseline 9/10 at worst; 5/10 at worst for the past 7 days (04/19/2016)   Time 4   Status On-going     PT LONG TERM GOAL #2   Title Patient will improve her  Neck Disability Index Score by at least 10% as a demonstration of improved function.    Baseline 30%; 30% (04/20/2015)   Time 6   Period Weeks   Status On-going     PT LONG TERM GOAL #3   Title Patient will be able to perform cervical AROM all planes with minimal to no complain of pain/symptoms to promote ability to turn her head, look around, and reach.    Time 6   Period Weeks   Status On-going               Plan - 04/26/16 1230    Clinical Impression Statement Decreaed neck pain with  L rotation and with R and L side bending to 3/10 after session. Tense R suboccipital muscles and R paraspinal muscles. Decreased tension after manual therapy. Improved C7 neutral position after activation of R scapular muscles and following STM to L rhomboid muscles.    Rehab Potential Good   Clinical Impairments Affecting Rehab Potential pain, chronicity of condition    PT Frequency 2x / week   PT Duration 4 weeks   PT Treatment/Interventions Manual techniques;Therapeutic exercise;Therapeutic activities;Dry needling;Neuromuscular re-education;Patient/family education;Ultrasound;Electrical Stimulation;Traction   PT Next Visit Plan manual therapy, scapular strengthening, posture, modalities PRN    Consulted and Agree with Plan of Care Patient      Patient will benefit from skilled therapeutic intervention in order to improve the following deficits and impairments:  Pain, Postural dysfunction, Improper body mechanics, Decreased strength  Visit Diagnosis: Cervicalgia  Chronic left shoulder pain     Problem List Patient Active Problem List   Diagnosis Date Noted  . Fatty liver 02/29/2016  . Elevated blood pressure reading 02/29/2016  . Increased pressure in the eye 02/29/2016  . Neck nodule 02/29/2016  . Neck pain 02/23/2016  .  Constipation 06/22/2014  . Stress 06/22/2014  . Health care maintenance 06/22/2014  . Rash 05/20/2014  . Cough 05/20/2014  . Fullness of breast 05/20/2014  . Costochondritis 05/11/2013  . URI (upper respiratory infection) 04/29/2013  . Degenerative disc disease, cervical 06/11/2012  . History of colonic polyps 06/11/2012  . GERD (gastroesophageal reflux disease) 06/11/2012    Joneen Boers PT, DPT   04/26/2016, 12:32 PM  [facility] [address] [place], [address] Phone: [phone]   Fax:  [phone]  Name: Maleika Renton MRN: [id number] Date of Birth: 03-Mar-1956

## 2016-04-29 ENCOUNTER — Ambulatory Visit: Payer: 59

## 2016-05-13 ENCOUNTER — Ambulatory Visit (INDEPENDENT_AMBULATORY_CARE_PROVIDER_SITE_OTHER): Payer: 59 | Admitting: Internal Medicine

## 2016-05-13 ENCOUNTER — Encounter: Payer: Self-pay | Admitting: Internal Medicine

## 2016-05-13 VITALS — BP 138/68 | HR 62 | Temp 98.6°F | Resp 16 | Ht 61.0 in | Wt 116.4 lb

## 2016-05-13 DIAGNOSIS — M542 Cervicalgia: Secondary | ICD-10-CM

## 2016-05-13 DIAGNOSIS — R030 Elevated blood-pressure reading, without diagnosis of hypertension: Secondary | ICD-10-CM

## 2016-05-13 DIAGNOSIS — R221 Localized swelling, mass and lump, neck: Secondary | ICD-10-CM | POA: Diagnosis not present

## 2016-05-13 DIAGNOSIS — Z0000 Encounter for general adult medical examination without abnormal findings: Secondary | ICD-10-CM

## 2016-05-13 DIAGNOSIS — Z86010 Personal history of colonic polyps: Secondary | ICD-10-CM

## 2016-05-13 DIAGNOSIS — K219 Gastro-esophageal reflux disease without esophagitis: Secondary | ICD-10-CM

## 2016-05-13 DIAGNOSIS — K760 Fatty (change of) liver, not elsewhere classified: Secondary | ICD-10-CM

## 2016-05-13 MED ORDER — ESTRADIOL 0.1 MG/GM VA CREA: TOPICAL_CREAM | VAGINAL | 1 refills | Status: DC

## 2016-05-13 MED ORDER — CLOTRIMAZOLE-BETAMETHASONE 1-0.05 % EX CREA: 1.0000 "application " | TOPICAL_CREAM | Freq: Two times a day (BID) | CUTANEOUS | 0 refills | Status: DC

## 2016-05-13 NOTE — Progress Notes (Signed)
Patient ID: Angel Taylor, female   DOB: 1956-01-25, 61 y.o.   MRN: [id number]   Subjective:    Patient ID: Angel Taylor, female    DOB: 1955-09-07, 61 y.o.   MRN: [id number]  HPI  Patient here for her physical exam.  States she is doing relatively well.  Persistent neck nodule.  Would like evaluation.  Also reports nasal congestion at night only.  Uses saline nasal spray at times.  Persistent rash foot.  Lotrimin did not help.  Discussed trying a different cream.  No chest pain.  No sob.  No acid reflux.  No abdominal pain or cramping.  Bowels stable.  She has been monitoring her blood pressure.  Blood pressures averaging - 110-130s/70-80s.     Past Medical History:  Diagnosis Date  . Anemia   . Chronic headaches   . Frequent UTI   . GERD (gastroesophageal reflux disease)   . Pre-eclampsia    Past Surgical History:  Procedure Laterality Date  . ABDOMINAL HYSTERECTOMY     total  . CESAREAN SECTION  1987  . TUBAL LIGATION  1987   Family History  Problem Relation Age of Onset  . Diabetes Mother   . Alzheimer's disease Mother   . Seizures Mother   . Heart disease Father   . Breast cancer Neg Hx    Social History   Social History  . Marital status: Married    Spouse name: N/A  . Number of children: 2  . Years of education: N/A   Social History Main Topics  . Smoking status: Never Smoker  . Smokeless tobacco: Never Used  . Alcohol use No  . Drug use: No  . Sexual activity: Not Asked   Other Topics Concern  . None   Social History Narrative  . None    Outpatient Encounter Prescriptions as of 05/13/2016  Medication Sig  . ESTRACE VAGINAL 0.1 MG/GM vaginal cream apply vaginally daily as directed by prescriber  . latanoprost (XALATAN) 0.005 % ophthalmic solution 1 drop at bedtime.  . pantoprazole (PROTONIX) 40 MG tablet Take 1 tablet (40 mg total) by mouth daily.  Marland Kitchen tiZANidine (ZANAFLEX) 4 MG capsule Take 1 capsule (4 mg total) by mouth at bedtime as needed for muscle  spasms.   No facility-administered encounter medications on file as of 05/13/2016.     Review of Systems  Constitutional: Negative for appetite change and unexpected weight change.  HENT: Negative for congestion and sinus pressure.   Eyes: Negative for pain and visual disturbance.  Respiratory: Negative for cough, chest tightness and shortness of breath.   Cardiovascular: Negative for chest pain, palpitations and leg swelling.  Gastrointestinal: Negative for abdominal pain, diarrhea, nausea and vomiting.  Genitourinary: Negative for difficulty urinating and dysuria.  Musculoskeletal: Negative for back pain and joint swelling.  Skin: Negative for color change and rash.  Neurological: Negative for dizziness, light-headedness and headaches.  Hematological: Negative for adenopathy. Does not bruise/bleed easily.  Psychiatric/Behavioral: Negative for agitation and dysphoric mood.       Objective:     Blood pressure rechecked by me:  136/82  Physical Exam  Constitutional: She is oriented to person, place, and time. She appears well-developed and well-nourished. No distress.  HENT:  Nose: Nose normal.  Mouth/Throat: Oropharynx is clear and moist.  Eyes: Right eye exhibits no discharge. Left eye exhibits no discharge. No scleral icterus.  Neck: Neck supple. No thyromegaly present.  Neck nodule - non tender.    Cardiovascular:  Normal rate and regular rhythm.   Pulmonary/Chest: Breath sounds normal. No accessory muscle usage. No tachypnea. No respiratory distress. She has no decreased breath sounds. She has no wheezes. She has no rhonchi. Right breast exhibits no inverted nipple, no mass, no nipple discharge and no tenderness (no axillary adenopathy). Left breast exhibits no inverted nipple, no mass, no nipple discharge and no tenderness (no axilarry adenopathy).  Abdominal: Soft. Bowel sounds are normal. There is no tenderness.  Musculoskeletal: She exhibits no edema or tenderness.    Lymphadenopathy:    She has no cervical adenopathy.  Neurological: She is alert and oriented to person, place, and time.  Skin: Skin is warm. No rash noted. No erythema.  Psychiatric: She has a normal mood and affect. Her behavior is normal.    BP 138/68 (BP Location: Left Arm, Patient Position: Sitting, Cuff Size: Large)   Pulse 62   Temp 98.6 F (37 C) (Oral)   Resp 16   Ht 5\' 1"  (1.549 m)   Wt 116 lb 6.4 oz (52.8 kg)   SpO2 99%   BMI 21.99 kg/m  Wt Readings from Last 3 Encounters:  05/13/16 116 lb 6.4 oz (52.8 kg)  02/23/16 117 lb 3.2 oz (53.2 kg)  09/10/14 119 lb (54 kg)     Lab Results  Component Value Date   WBC 6.1 03/12/2016   HGB 13.2 03/12/2016   HCT 39.8 03/12/2016   PLT 264.0 03/12/2016   GLUCOSE 89 03/12/2016   CHOL 228 (H) 03/12/2016   TRIG 53.0 03/12/2016   HDL 79.90 03/12/2016   LDLDIRECT 119.7 05/30/2012   LDLCALC 138 (H) 03/12/2016   ALT 12 03/12/2016   AST 16 03/12/2016   NA 138 03/12/2016   K 4.3 03/12/2016   CL 101 03/12/2016   CREATININE 0.87 03/12/2016   BUN 14 03/12/2016   CO2 28 03/12/2016   TSH 1.16 03/12/2016    Mm Screening Breast Tomo Bilateral  Result Date: 04/07/2016 CLINICAL DATA:  Screening. EXAM: 2D DIGITAL SCREENING BILATERAL MAMMOGRAM WITH CAD AND ADJUNCT TOMO COMPARISON:  Previous exam(s). ACR Breast Density Category c: The breast tissue is heterogeneously dense, which may obscure small masses. FINDINGS: There are no findings suspicious for malignancy. Images were processed with CAD. IMPRESSION: No mammographic evidence of malignancy. A result letter of this screening mammogram will be mailed directly to the patient. RECOMMENDATION: Screening mammogram in one year. (Code:SM-B-01Y) BI-RADS CATEGORY  1: Negative. Electronically Signed   By: Lillia Mountain M.D.   On: 04/07/2016 12:10       Assessment & Plan:   Problem List Items Addressed This Visit    None       Einar Pheasant, MD

## 2016-05-13 NOTE — Progress Notes (Signed)
Pre-visit discussion using our clinic review tool. No additional management support is needed unless otherwise documented below in the visit note.  

## 2016-05-13 NOTE — Assessment & Plan Note (Signed)
Physical today 05/13/16.  mmmogram 04/07/16 - Birads I.  Colonoscopy 07/2012 - no polyps.

## 2016-05-13 NOTE — Patient Instructions (Signed)
nasacort nasal spray - 2 sprays each nostril one time per day.  Do this in the evening.   

## 2016-05-23 ENCOUNTER — Encounter: Payer: Self-pay | Admitting: Internal Medicine

## 2016-05-23 NOTE — Assessment & Plan Note (Signed)
Outside checks under good control.  Appears to be doing ok.  Follow.

## 2016-05-23 NOTE — Assessment & Plan Note (Signed)
Persistent.  Have ent evaluate.

## 2016-05-23 NOTE — Assessment & Plan Note (Signed)
Previous ultrasound revealed fatty liver.  Follow liver function tests.   

## 2016-05-23 NOTE — Assessment & Plan Note (Signed)
Colonoscopy 07/2012 - no polyps.  Follow.

## 2016-05-23 NOTE — Assessment & Plan Note (Signed)
Doing well on protonix.  Follow.   

## 2016-05-31 ENCOUNTER — Other Ambulatory Visit: Payer: Self-pay | Admitting: Otolaryngology

## 2016-05-31 DIAGNOSIS — D37032 Neoplasm of uncertain behavior of the submandibular salivary glands: Secondary | ICD-10-CM

## 2016-06-14 ENCOUNTER — Ambulatory Visit
Admission: RE | Admit: 2016-06-14 | Discharge: 2016-06-14 | Disposition: A | Discharge status: Home / Self Care | Payer: 59 | Source: Ambulatory Visit | Attending: Otolaryngology | Admitting: Otolaryngology

## 2016-06-14 DIAGNOSIS — D37032 Neoplasm of uncertain behavior of the submandibular salivary glands: Secondary | ICD-10-CM | POA: Diagnosis not present

## 2016-06-14 MED ORDER — IOPAMIDOL (ISOVUE-300) INJECTION 61%
75.0000 mL | Freq: Once | INTRAVENOUS | Status: AC | PRN
  Administered 2016-06-14: 75 mL via INTRAVENOUS

## 2016-06-17 ENCOUNTER — Other Ambulatory Visit: Payer: Self-pay | Admitting: Otolaryngology

## 2016-06-17 DIAGNOSIS — R221 Localized swelling, mass and lump, neck: Secondary | ICD-10-CM

## 2016-06-18 ENCOUNTER — Other Ambulatory Visit: Payer: Self-pay | Admitting: Radiology

## 2016-06-22 ENCOUNTER — Ambulatory Visit
Admission: RE | Admit: 2016-06-22 | Discharge: 2016-06-22 | Disposition: A | Discharge status: Home / Self Care | Payer: 59 | Source: Ambulatory Visit | Attending: Otolaryngology | Admitting: Otolaryngology

## 2016-06-22 DIAGNOSIS — R221 Localized swelling, mass and lump, neck: Secondary | ICD-10-CM | POA: Diagnosis present

## 2016-06-22 DIAGNOSIS — K118 Other diseases of salivary glands: Secondary | ICD-10-CM | POA: Diagnosis not present

## 2016-06-22 NOTE — Procedures (Signed)
Interventional Radiology Procedure Note  Procedure: US guided FNA biopsy of right submandibular gland mass  Complications: None  Estimated Blood Loss: < 10 mL  10 mm hypoechoic nodule in right submandibular gland.  25 G and 22 G FNA performed under US guidance.  Venetia Night. Kathlene Cote, M.D Pager:  [phone]

## 2016-06-22 NOTE — Discharge Instructions (Signed)
Needle Biopsy, Care After °Refer to this sheet in the next few weeks. These instructions provide you with information about caring for yourself after your procedure. Your health care provider may also give you more specific instructions. Your treatment has been planned according to current medical practices, but problems sometimes occur. Call your health care provider if you have any problems or questions after your procedure. °What can I expect after the procedure? °After your procedure, it is common to have soreness, bruising, or mild pain at the biopsy site. This should go away in a few days. °Follow these instructions at home: °· Rest as directed by your health care provider. °· Take medicines only as directed by your health care provider. °· There are many different ways to close and cover the biopsy site, including stitches (sutures), skin glue, and adhesive strips. Follow your health care provider's instructions about: °¨ Biopsy site care. °¨ Bandage (dressing) changes and removal. °¨ Biopsy site closure removal. °· Check your biopsy site every day for signs of infection. Watch for: °¨ Redness, swelling, or pain. °¨ Fluid, blood, or pus. °Contact a health care provider if: °· You have a fever. °· You have redness, swelling, or pain at the biopsy site that lasts longer than a few days. °· You have fluid, blood, or pus coming from the biopsy site. °· You feel nauseous. °· You vomit. °Get help right away if: °· You have shortness of breath. °· You have trouble breathing. °· You have chest pain. °· You feel dizzy or you faint. °· You have bleeding that does not stop with pressure or a bandage. °· You cough up blood. °· You have pain in your abdomen. °This information is not intended to replace advice given to you by your health care provider. Make sure you discuss any questions you have with your health care provider. °Document Released: 08/06/2014 Document Revised: 08/28/2015 Document Reviewed:  03/18/2014 °Elsevier Interactive Patient Education © 2017 [employer] ° °

## 2016-06-24 LAB — CYTOLOGY - NON PAP

## 2017-02-18 ENCOUNTER — Ambulatory Visit (INDEPENDENT_AMBULATORY_CARE_PROVIDER_SITE_OTHER): Payer: 59

## 2017-02-18 DIAGNOSIS — Z23 Encounter for immunization: Secondary | ICD-10-CM

## 2017-04-01 ENCOUNTER — Other Ambulatory Visit: Payer: Self-pay | Admitting: Internal Medicine

## 2017-04-01 DIAGNOSIS — Z1231 Encounter for screening mammogram for malignant neoplasm of breast: Secondary | ICD-10-CM

## 2017-04-22 ENCOUNTER — Ambulatory Visit
Admission: RE | Admit: 2017-04-22 | Discharge: 2017-04-22 | Disposition: A | Discharge status: Home / Self Care | Payer: 59 | Source: Ambulatory Visit | Attending: Internal Medicine | Admitting: Internal Medicine

## 2017-04-22 DIAGNOSIS — Z1231 Encounter for screening mammogram for malignant neoplasm of breast: Secondary | ICD-10-CM

## 2017-07-17 ENCOUNTER — Other Ambulatory Visit: Payer: Self-pay | Admitting: Internal Medicine

## 2018-04-06 ENCOUNTER — Ambulatory Visit (INDEPENDENT_AMBULATORY_CARE_PROVIDER_SITE_OTHER): Payer: 59

## 2018-04-06 DIAGNOSIS — Z23 Encounter for immunization: Secondary | ICD-10-CM | POA: Diagnosis not present

## 2018-04-11 IMAGING — US US ABDOMEN LIMITED
1 series · 14 of 25 positions shown · non-contrast
Comparison: None.

CLINICAL DATA: Right upper quadrant pain.

EXAM:
US ABDOMEN LIMITED - RIGHT UPPER QUADRANT

[Series 1: us abdomen limited · 0.11mm/px · 14 of 69 slices shown]
[im 1/69]
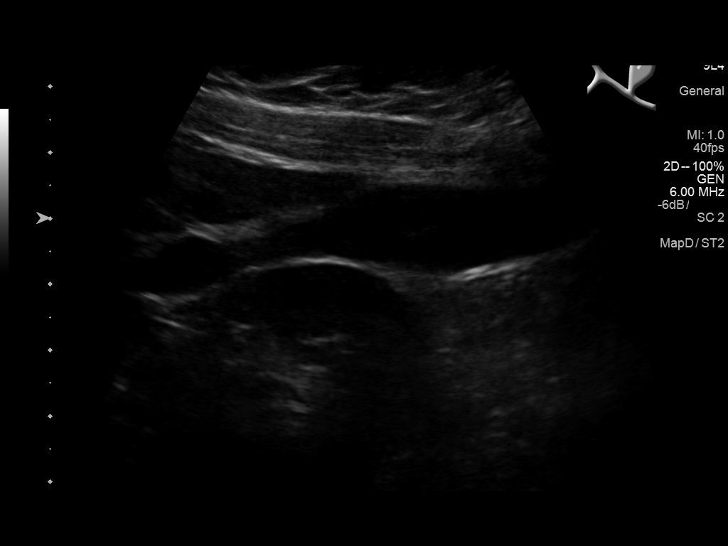
[im 6/69]
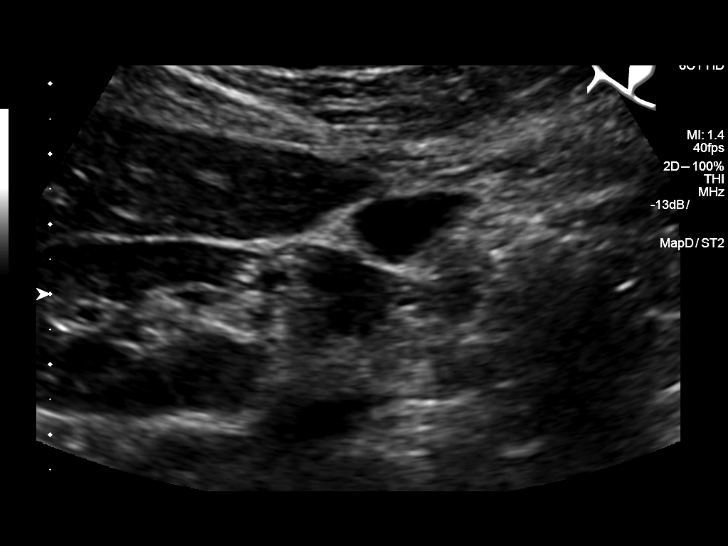
[im 12/69]
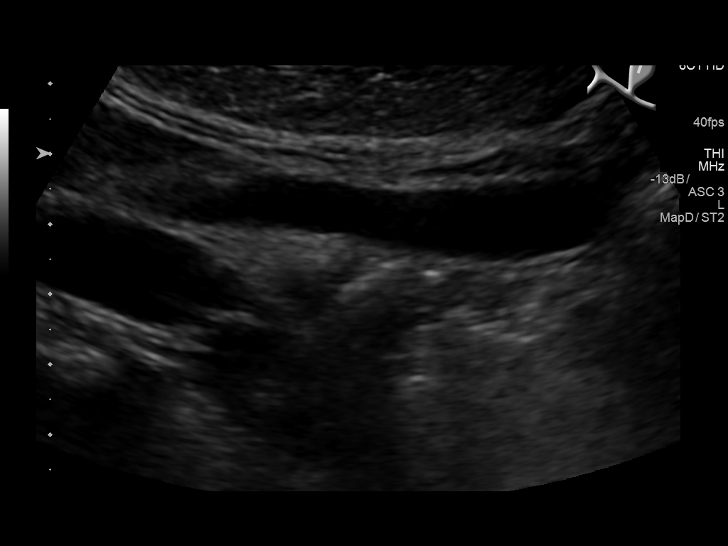
[im 18/69]
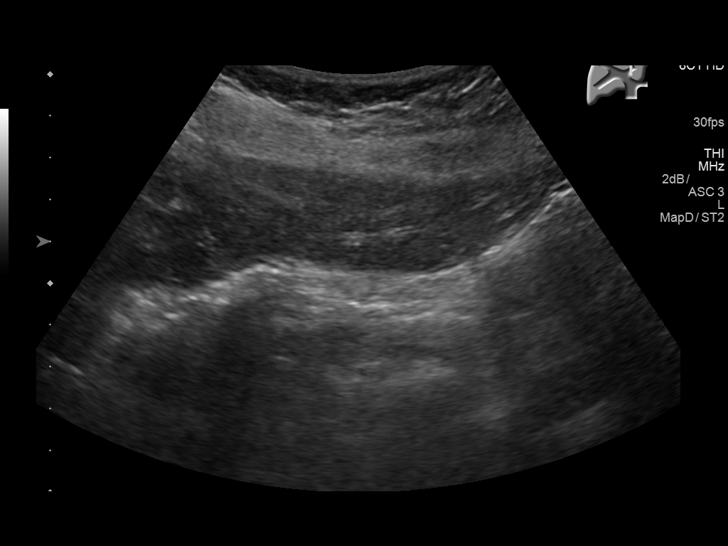
[im 23/69]
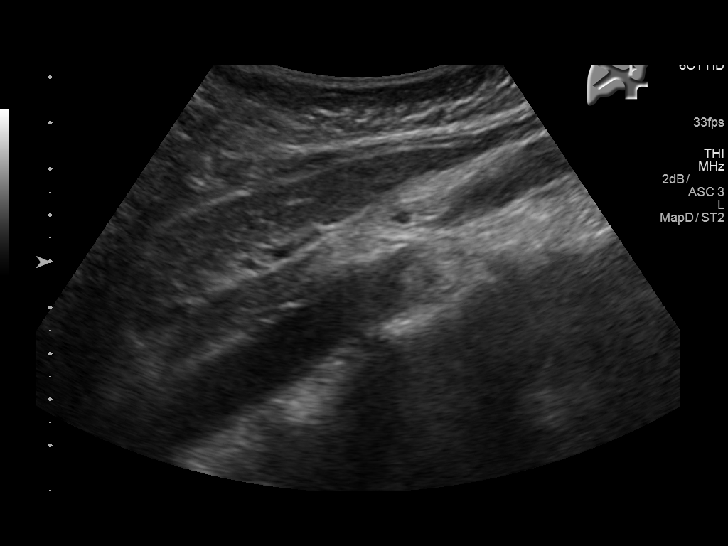
[im 26/69]
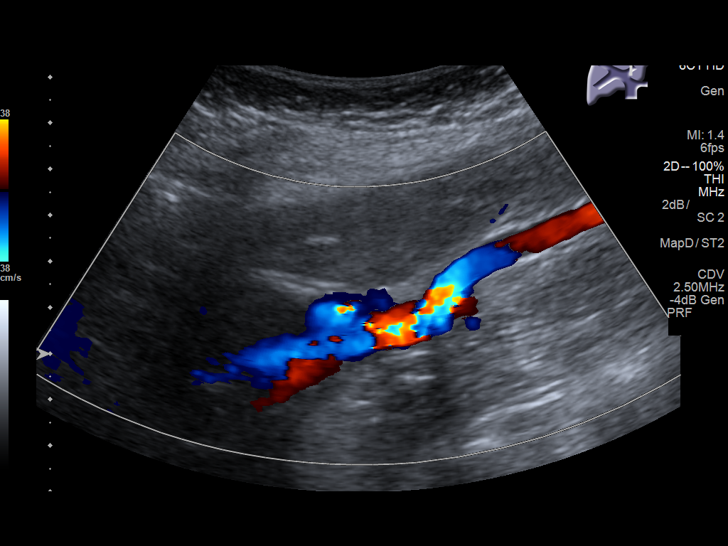
[im 32/69]
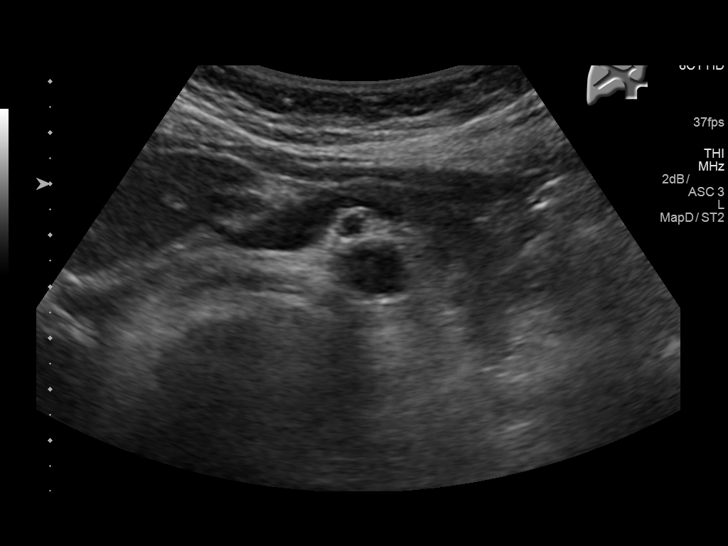
[im 37/69]
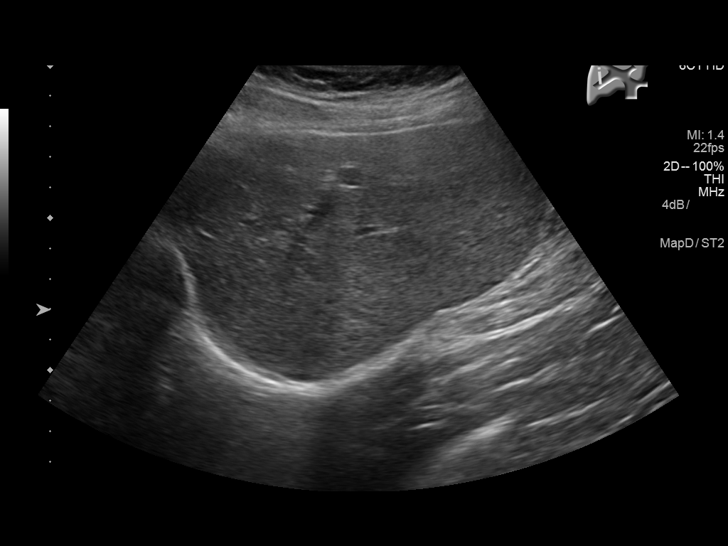
[im 43/69]
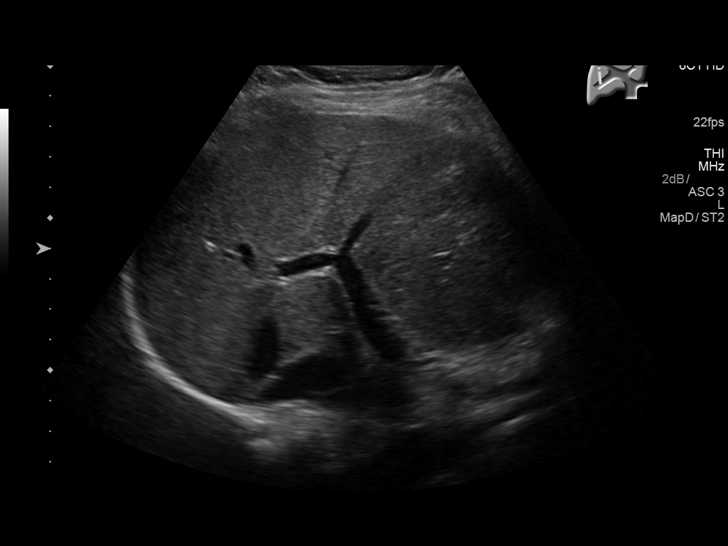
[im 46/69]
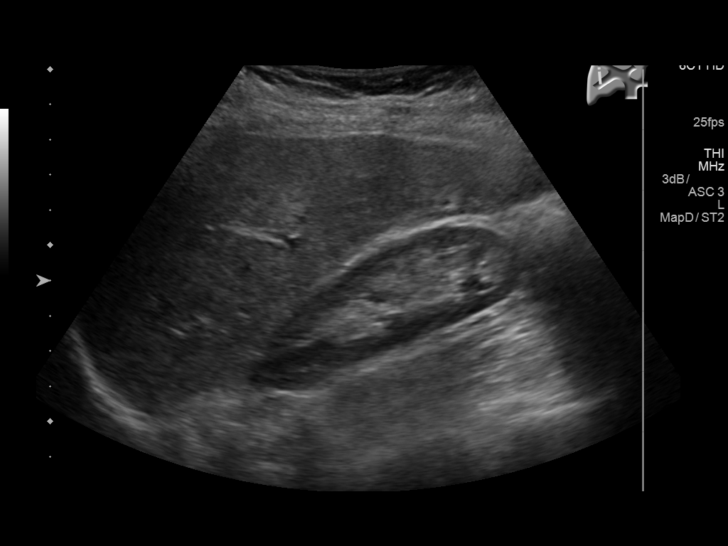
[im 52/69]
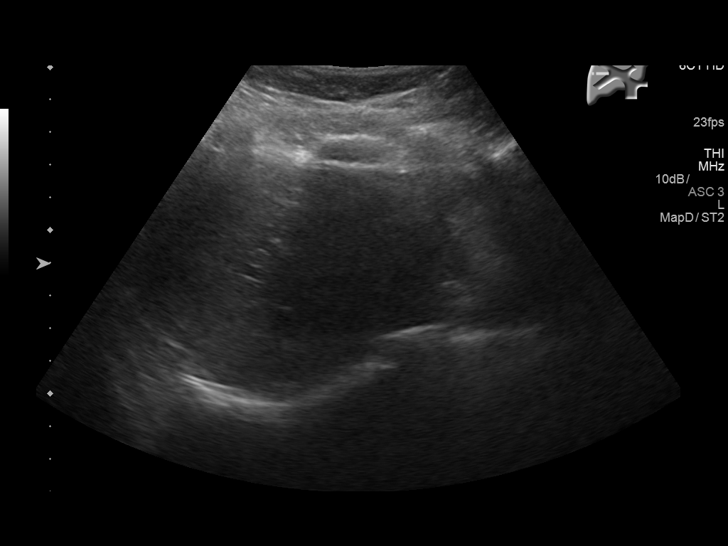
[im 57/69]
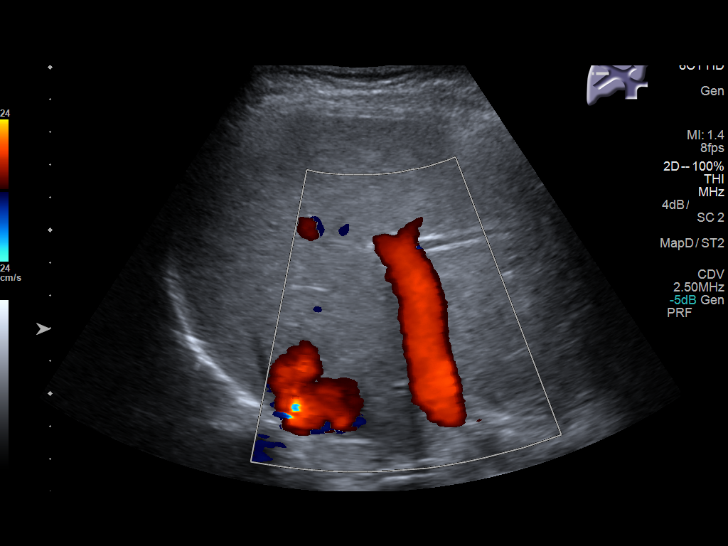
[im 63/69]
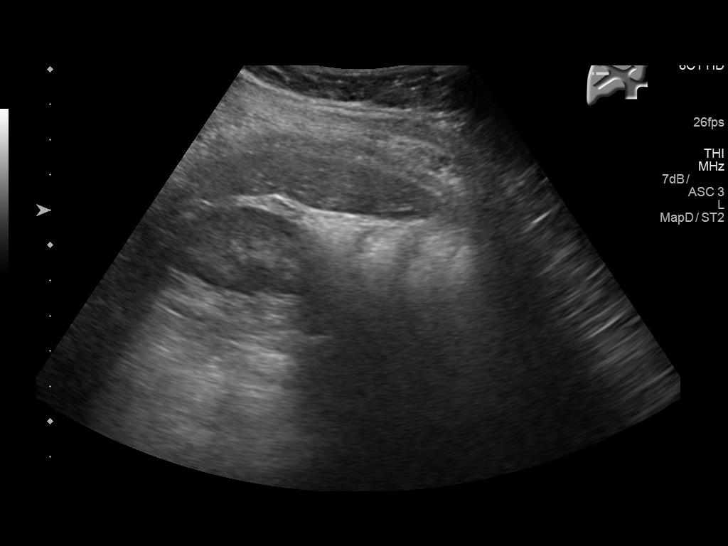
[im 69/69]
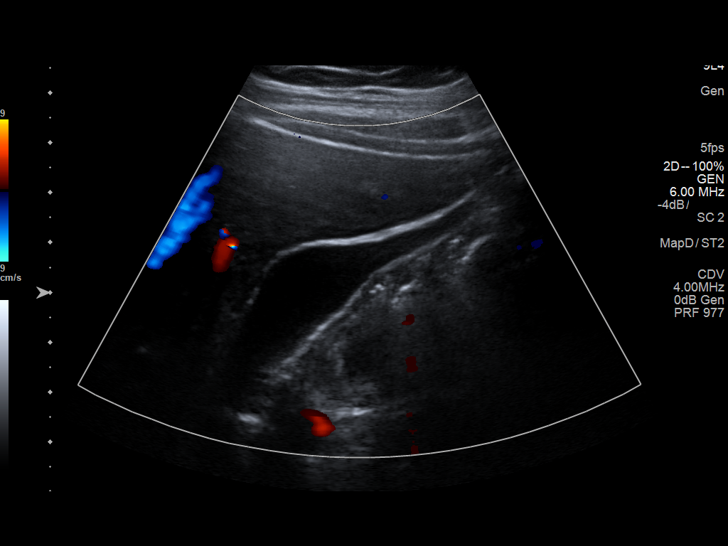

[14 of 25 positions shown; findings below may reference images not displayed]

FINDINGS: Gallbladder:

No gallstones or wall thickening visualized. No sonographic Murphy
sign noted by sonographer.

Common bile duct:

Diameter: 2 mm

Liver:

Liver is slightly echogenic consistent fatty infiltration and/or
hepatocellular disease. Portal vein is patent with normal
directional flow.
IMPRESSION: 1. Liver is slightly echogenic consistent fatty infiltration and/or
hepatocellular disease.

2. No acute or focal abnormality.

## 2018-04-27 ENCOUNTER — Other Ambulatory Visit: Payer: Self-pay | Admitting: Internal Medicine

## 2018-04-27 DIAGNOSIS — Z1231 Encounter for screening mammogram for malignant neoplasm of breast: Secondary | ICD-10-CM

## 2018-05-11 ENCOUNTER — Ambulatory Visit
Admission: RE | Admit: 2018-05-11 | Discharge: 2018-05-11 | Disposition: A | Discharge status: Home / Self Care | Payer: 59 | Source: Ambulatory Visit | Attending: Internal Medicine | Admitting: Internal Medicine

## 2018-05-11 DIAGNOSIS — Z1231 Encounter for screening mammogram for malignant neoplasm of breast: Secondary | ICD-10-CM | POA: Insufficient documentation

## 2018-07-07 ENCOUNTER — Ambulatory Visit (INDEPENDENT_AMBULATORY_CARE_PROVIDER_SITE_OTHER): Payer: Self-pay | Admitting: Internal Medicine

## 2018-07-07 ENCOUNTER — Encounter: Payer: 59 | Admitting: Internal Medicine

## 2018-07-07 ENCOUNTER — Encounter: Payer: Self-pay | Admitting: Internal Medicine

## 2018-07-07 DIAGNOSIS — R221 Localized swelling, mass and lump, neck: Secondary | ICD-10-CM

## 2018-07-07 DIAGNOSIS — K760 Fatty (change of) liver, not elsewhere classified: Secondary | ICD-10-CM

## 2018-07-07 DIAGNOSIS — R030 Elevated blood-pressure reading, without diagnosis of hypertension: Secondary | ICD-10-CM

## 2018-07-07 DIAGNOSIS — K5900 Constipation, unspecified: Secondary | ICD-10-CM

## 2018-07-07 NOTE — Progress Notes (Addendum)
Patient ID: Angel Taylor, female   DOB: [birth year], 63 y.o.   MRN: [id number] Virtual Visit via Video Note  I connected with Angel Taylor on 07/07/18 at  2:00 PM EDT by a video enabled telemedicine application and verified that I am speaking with the correct person using two identifiers.  Location patient: home Location provider:work Persons participating in the virtual visit: patient, provider  I discussed the limitations of evaluation and management by telemedicine.  This visit type was conducted due to national recommendations for restrictions regarding the COVID-19 pandemic.  This format is felt to be most appropriate for this patient at this time.  Due to technical difficulties, the patient was able to see me and hear me, but I could not see the patient.  I only had audio contact.  The patient expressed understanding and agreed to proceed.   HPI: This was a scheduled follow up.  Her last visit with me was 05/2016.  She reports she is doing relatively well.  Staying active.  No chest pain.  No sob.  No acid reflux.  Off protonix.  States doing well without the medication.  No abdominal pain.  Had some constipation.  Started drinking prune juice and taking miralax.  Eating fiber.  Bowels better.  Saw Dr Angel Taylor.  Had colonoscopy.  States everything checked out fine. Recommended f/u colonoscopy in 10 years.  She saw ENT for neck nodule.  Had biopsy.  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  Has not been checking her blood pressure.  Discussed spot checking when able.  Was having issues with her left foot itching.  States if she watches her sugar intake, better.  Has eye appt 07/28/18.     ROS: See pertinent positives and negatives per HPI.  Past Medical History:  Diagnosis Date  . Anemia   . Chronic headaches   . Frequent UTI   . GERD (gastroesophageal reflux disease)   . Pre-eclampsia     Past Surgical History:  Procedure Laterality Date  . ABDOMINAL HYSTERECTOMY     total  .  CESAREAN SECTION  1987  . TUBAL LIGATION  1987    Family History  Problem Relation Age of Onset  . Diabetes Mother   . Alzheimer's disease Mother   . Seizures Mother   . Heart disease Father   . Breast cancer Neg Hx     SOCIAL HX: reviewed.    Current Outpatient Medications:  .  latanoprost (XALATAN) 0.005 % ophthalmic solution, 1 drop at bedtime., Disp: , Rfl:   EXAM:  GENERAL: answering questions appropriately.  Sound to be in no acute distress.   PSYCH/NEURO: pleasant and cooperative, no obvious depression or anxiety, speech and thought processing grossly intact  ASSESSMENT AND PLAN:  Discussed the following assessment and plan:  Constipation, unspecified constipation type  Elevated blood pressure reading  Fatty liver  Neck nodule     I discussed the assessment and treatment plan with the patient. The patient was provided an opportunity to ask questions and all were answered. The patient agreed with the plan and demonstrated an understanding of the instructions.   The patient was advised to call back or seek an in-person evaluation if the symptoms worsen or if the condition fails to improve as anticipated.  I provided 20 minutes of non-face-to-face time during this encounter.   Einar Pheasant, MD

## 2018-07-09 ENCOUNTER — Encounter: Payer: Self-pay | Admitting: Internal Medicine

## 2018-07-09 NOTE — Assessment & Plan Note (Signed)
Previous elevated blood pressure reading.  Discussed with her today.  Have her spot check her pressure when able.  Follow.

## 2018-07-09 NOTE — Assessment & Plan Note (Signed)
Saw ENT.  S/p biopsy.  No malignancy.

## 2018-07-09 NOTE — Assessment & Plan Note (Signed)
Drinking prune juice and taking miralax.  Eating fiber.  Bowels better.  Had colonoscopy 06/18/18 as outlined.  Recommended f/u in 10 years.

## 2018-07-09 NOTE — Assessment & Plan Note (Signed)
Previous ultrasound revealed fatty liver.  Follow liver function tests.   

## 2018-08-04 IMAGING — DX DG CERVICAL SPINE 2 OR 3 VIEWS
3 series · 3 of 3 positions shown · non-contrast
Comparison: None.

CLINICAL DATA: Neck pain

EXAM:
CERVICAL SPINE - 2-3 VIEW

[cervical spine ap]
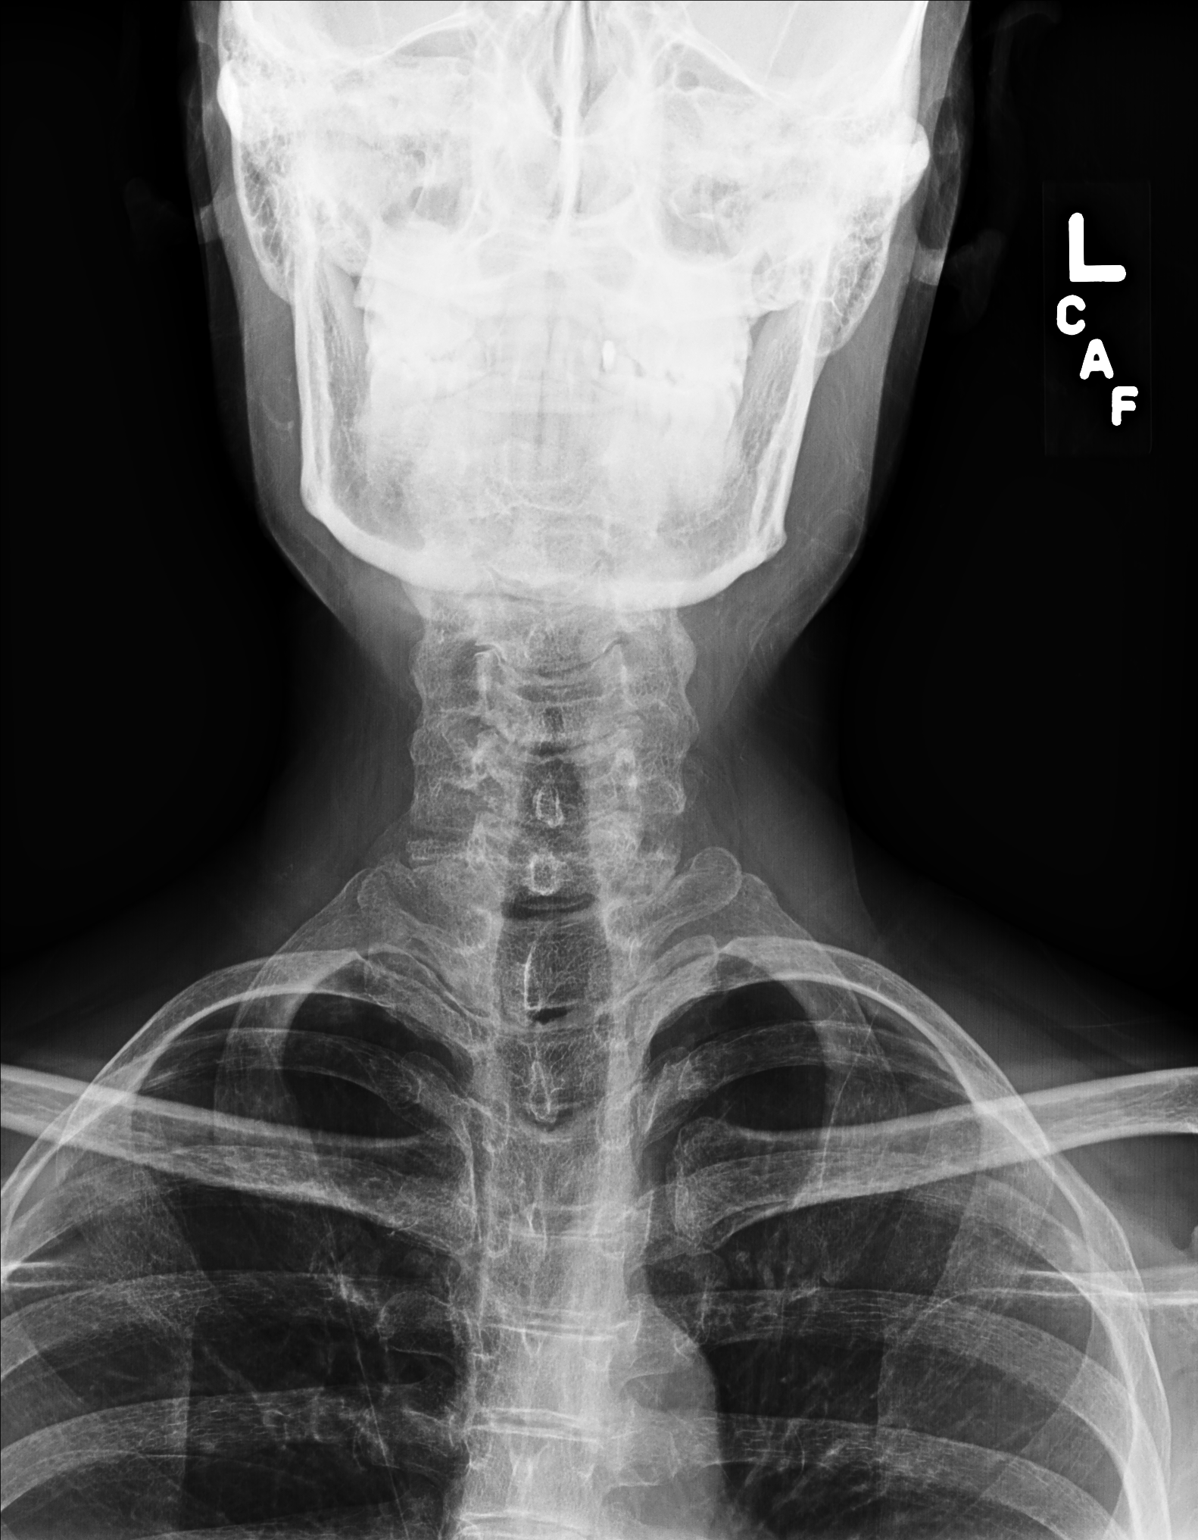

[cervical spine lat]
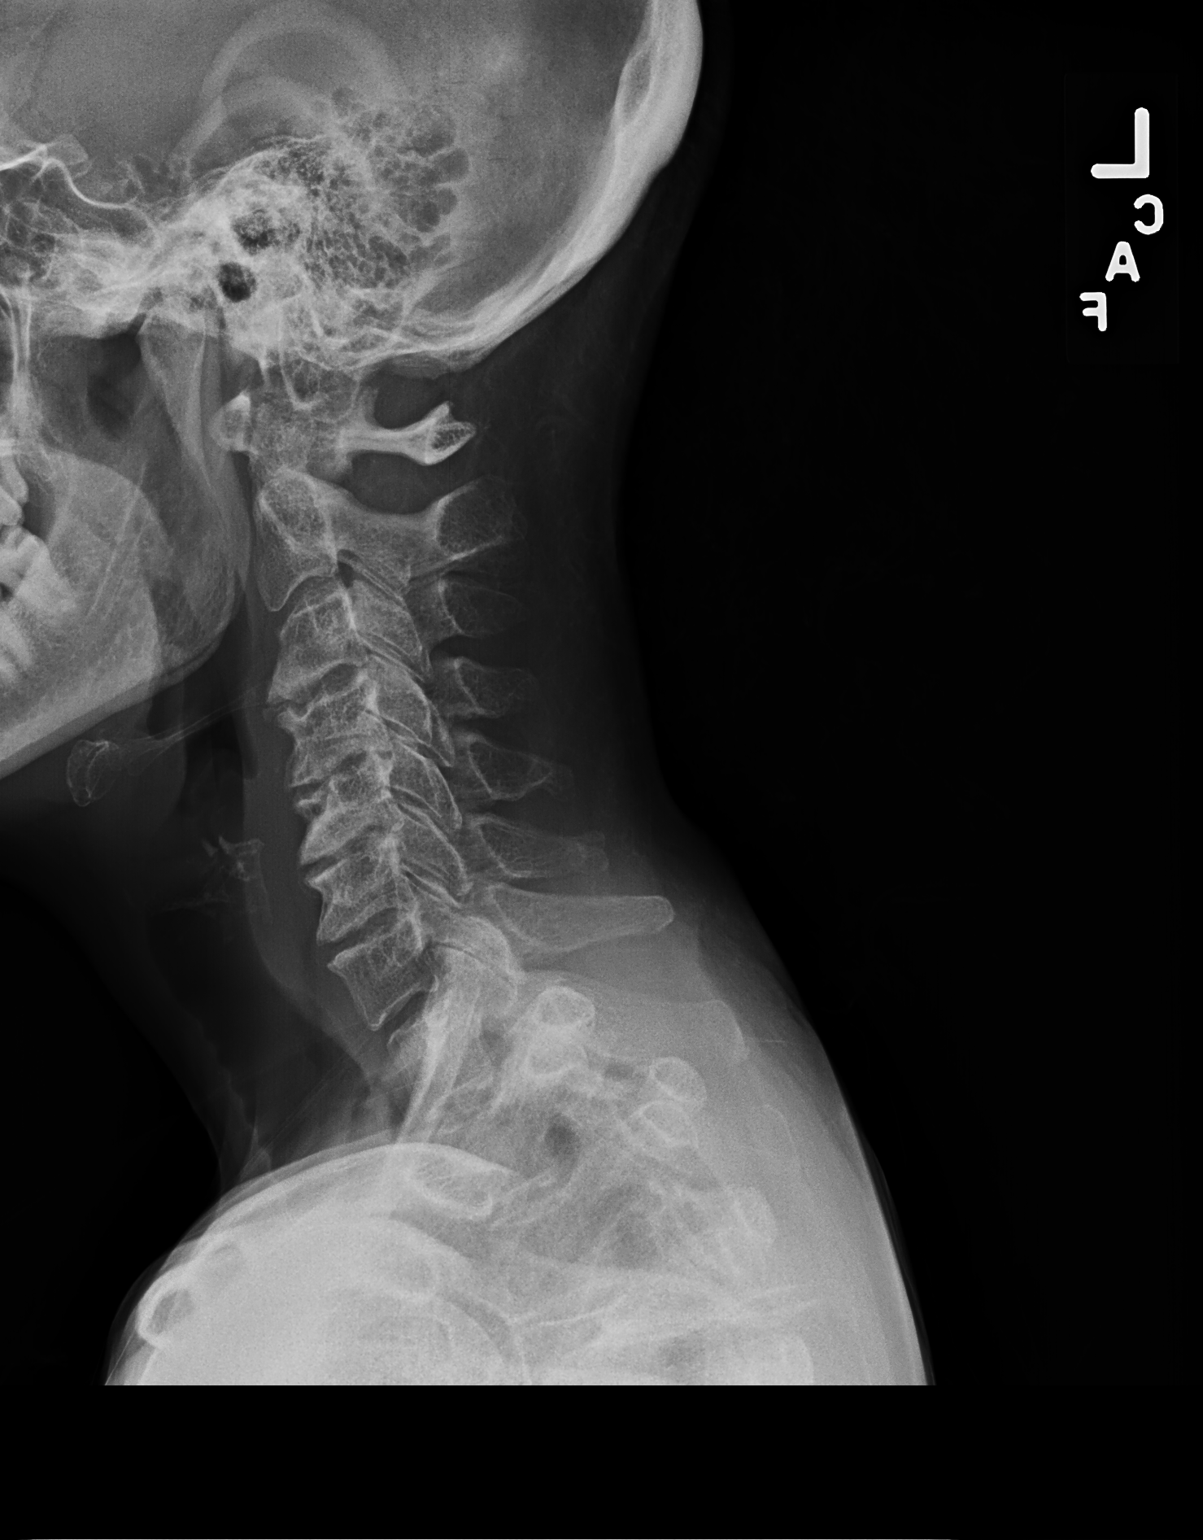

[cervical spine open mouth ap]
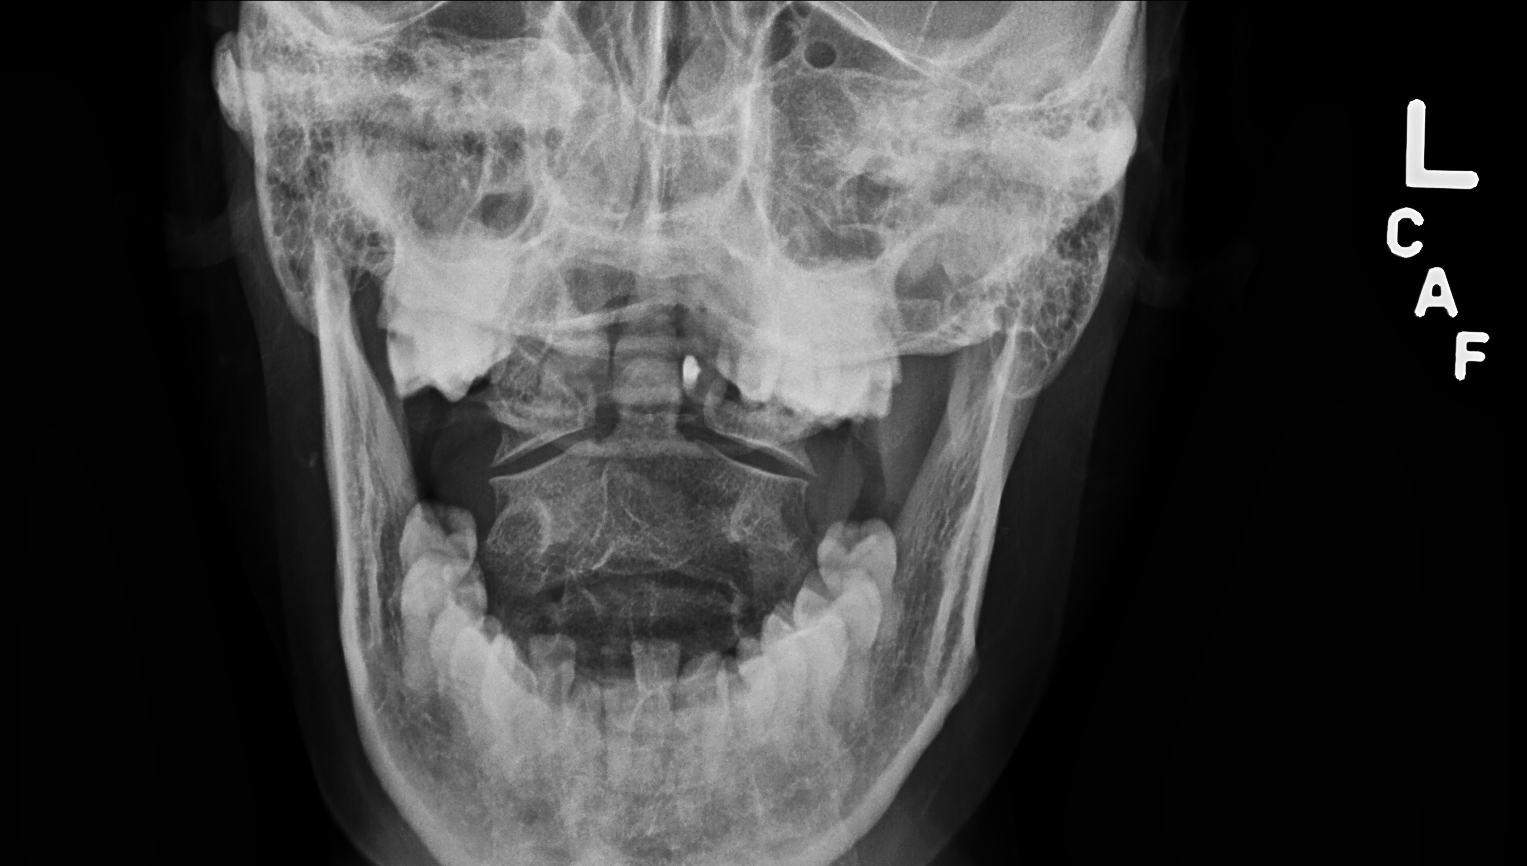

[3 of 3 positions shown; findings below may reference images not displayed]

FINDINGS: Three views of the cervical spine submitted. No acute fracture or
subluxation. C1-C2 relationship is unremarkable. There is disc space
flattening with anterior spurring at C3-C4, C4-C5, C5-C6 level. Mild
disc space flattening with anterior spurring at C6-C7 level. No
prevertebral soft tissue swelling. Cervical airway is patent.
IMPRESSION: No acute fracture or subluxation. Multilevel degenerative changes as
described above.

## 2018-09-15 DIAGNOSIS — H401131 Primary open-angle glaucoma, bilateral, mild stage: Secondary | ICD-10-CM | POA: Diagnosis not present

## 2018-09-15 DIAGNOSIS — H2513 Age-related nuclear cataract, bilateral: Secondary | ICD-10-CM | POA: Diagnosis not present

## 2018-11-16 ENCOUNTER — Telehealth: Payer: Self-pay | Admitting: *Deleted

## 2018-11-16 DIAGNOSIS — K760 Fatty (change of) liver, not elsewhere classified: Secondary | ICD-10-CM

## 2018-11-16 DIAGNOSIS — Z13220 Encounter for screening for lipoid disorders: Secondary | ICD-10-CM

## 2018-11-16 NOTE — Telephone Encounter (Signed)
Orders placed for labs

## 2018-11-16 NOTE — Telephone Encounter (Signed)
Please place future orders for lab appt.  

## 2018-11-20 ENCOUNTER — Other Ambulatory Visit: Payer: Self-pay

## 2018-11-20 ENCOUNTER — Other Ambulatory Visit (INDEPENDENT_AMBULATORY_CARE_PROVIDER_SITE_OTHER): Payer: BC Managed Care – PPO

## 2018-11-20 DIAGNOSIS — K760 Fatty (change of) liver, not elsewhere classified: Secondary | ICD-10-CM | POA: Diagnosis not present

## 2018-11-20 DIAGNOSIS — Z13220 Encounter for screening for lipoid disorders: Secondary | ICD-10-CM

## 2018-11-20 LAB — TSH: TSH: 1.44 u[IU]/mL (ref 0.35–4.50)

## 2018-11-20 LAB — COMPREHENSIVE METABOLIC PANEL
ALT: 14 U/L (ref 0–35)
AST: 17 U/L (ref 0–37)
Albumin: 4.1 g/dL (ref 3.5–5.2)
Alkaline Phosphatase: 50 U/L (ref 39–117)
BUN: 14 mg/dL (ref 6–23)
CO2: 27 mEq/L (ref 19–32)
Calcium: 9.3 mg/dL (ref 8.4–10.5)
Chloride: 102 mEq/L (ref 96–112)
Creatinine, Ser: 0.89 mg/dL (ref 0.40–1.20)
GFR: 77.84 mL/min (ref >60.00)
Glucose, Bld: 76 mg/dL (ref 70–99)
Potassium: 3.8 mEq/L (ref 3.5–5.1)
Sodium: 138 mEq/L (ref 135–145)
Total Bilirubin: 0.8 mg/dL (ref 0.2–1.2)
Total Protein: 6.7 g/dL (ref 6.0–8.3)

## 2018-11-20 LAB — CBC WITH DIFFERENTIAL/PLATELET
Basophils Absolute: 0 10*3/uL (ref 0.0–0.1)
Basophils Relative: 0.6 % (ref 0.0–3.0)
Eosinophils Absolute: 0.2 10*3/uL (ref 0.0–0.7)
Eosinophils Relative: 3.3 % (ref 0.0–5.0)
HCT: 38.7 % (ref 36.0–46.0)
Hemoglobin: 12.7 g/dL (ref 12.0–15.0)
Lymphocytes Relative: 60.8 % — ABNORMAL HIGH (ref 12.0–46.0)
Lymphs Abs: 2.8 10*3/uL (ref 0.7–4.0)
MCHC: 32.7 g/dL (ref 30.0–36.0)
MCV: 93.7 fl (ref 78.0–100.0)
Monocytes Absolute: 0.4 10*3/uL (ref 0.1–1.0)
Monocytes Relative: 9.6 % (ref 3.0–12.0)
Neutro Abs: 1.2 10*3/uL — ABNORMAL LOW (ref 1.4–7.7)
Neutrophils Relative %: 25.7 % — ABNORMAL LOW (ref 43.0–77.0)
Platelets: 235 10*3/uL (ref 150.0–400.0)
RBC: 4.14 Mil/uL (ref 3.87–5.11)
RDW: 12.7 % (ref 11.5–15.5)
WBC: 4.6 10*3/uL (ref 4.0–10.5)

## 2018-11-20 LAB — LIPID PANEL
Cholesterol: 210 mg/dL — ABNORMAL HIGH (ref 0–200)
HDL: 76 mg/dL (ref >39.00)
LDL Cholesterol: 120 mg/dL — ABNORMAL HIGH (ref 0–99)
NonHDL: 133.56
Total CHOL/HDL Ratio: 3
Triglycerides: 70 mg/dL (ref 0.0–149.0)
VLDL: 14 mg/dL (ref 0.0–40.0)

## 2018-11-21 ENCOUNTER — Telehealth: Payer: Self-pay | Admitting: *Deleted

## 2018-11-21 NOTE — Telephone Encounter (Signed)
Copied from [place] [id number]. Topic: Appointment Scheduling - Scheduling Inquiry for Clinic >> Nov 21, 2018  2:07 PM Rayann Heman wrote: Reason for CRM: pt called to do travel screening for appointment.

## 2018-11-22 ENCOUNTER — Other Ambulatory Visit: Payer: Self-pay

## 2018-11-23 ENCOUNTER — Encounter: Payer: Self-pay | Admitting: Internal Medicine

## 2019-01-26 DIAGNOSIS — H401131 Primary open-angle glaucoma, bilateral, mild stage: Secondary | ICD-10-CM | POA: Diagnosis not present

## 2019-01-26 DIAGNOSIS — H2513 Age-related nuclear cataract, bilateral: Secondary | ICD-10-CM | POA: Diagnosis not present

## 2019-02-02 ENCOUNTER — Ambulatory Visit (INDEPENDENT_AMBULATORY_CARE_PROVIDER_SITE_OTHER): Payer: BC Managed Care – PPO | Admitting: Internal Medicine

## 2019-02-02 ENCOUNTER — Encounter: Payer: Self-pay | Admitting: Internal Medicine

## 2019-02-02 ENCOUNTER — Other Ambulatory Visit: Payer: Self-pay

## 2019-02-02 ENCOUNTER — Other Ambulatory Visit (HOSPITAL_COMMUNITY)
Admission: RE | Admit: 2019-02-02 | Discharge: 2019-02-02 | Disposition: A | Discharge status: Home / Self Care | Payer: BC Managed Care – PPO | Source: Ambulatory Visit | Attending: Internal Medicine | Admitting: Internal Medicine

## 2019-02-02 ENCOUNTER — Ambulatory Visit (INDEPENDENT_AMBULATORY_CARE_PROVIDER_SITE_OTHER): Payer: BC Managed Care – PPO

## 2019-02-02 VITALS — BP 140/90 | HR 64 | Temp 97.3°F | Resp 16 | Ht 62.0 in | Wt 115.8 lb

## 2019-02-02 DIAGNOSIS — K760 Fatty (change of) liver, not elsewhere classified: Secondary | ICD-10-CM

## 2019-02-02 DIAGNOSIS — D72820 Lymphocytosis (symptomatic): Secondary | ICD-10-CM

## 2019-02-02 DIAGNOSIS — K219 Gastro-esophageal reflux disease without esophagitis: Secondary | ICD-10-CM

## 2019-02-02 DIAGNOSIS — Z0000 Encounter for general adult medical examination without abnormal findings: Secondary | ICD-10-CM

## 2019-02-02 DIAGNOSIS — M542 Cervicalgia: Secondary | ICD-10-CM

## 2019-02-02 DIAGNOSIS — Z124 Encounter for screening for malignant neoplasm of cervix: Secondary | ICD-10-CM

## 2019-02-02 DIAGNOSIS — M5030 Other cervical disc degeneration, unspecified cervical region: Secondary | ICD-10-CM | POA: Diagnosis not present

## 2019-02-02 DIAGNOSIS — F439 Reaction to severe stress, unspecified: Secondary | ICD-10-CM

## 2019-02-02 DIAGNOSIS — R030 Elevated blood-pressure reading, without diagnosis of hypertension: Secondary | ICD-10-CM

## 2019-02-02 LAB — CBC WITH DIFFERENTIAL/PLATELET
Basophils Absolute: 0.1 10*3/uL (ref 0.0–0.1)
Basophils Relative: 1.4 % (ref 0.0–3.0)
Eosinophils Absolute: 0.2 10*3/uL (ref 0.0–0.7)
Eosinophils Relative: 4.3 % (ref 0.0–5.0)
HCT: 40.9 % (ref 36.0–46.0)
Hemoglobin: 13.3 g/dL (ref 12.0–15.0)
Lymphocytes Relative: 56.6 % — ABNORMAL HIGH (ref 12.0–46.0)
Lymphs Abs: 2.6 10*3/uL (ref 0.7–4.0)
MCHC: 32.5 g/dL (ref 30.0–36.0)
MCV: 94.3 fl (ref 78.0–100.0)
Monocytes Absolute: 0.4 10*3/uL (ref 0.1–1.0)
Monocytes Relative: 8.8 % (ref 3.0–12.0)
Neutro Abs: 1.3 10*3/uL — ABNORMAL LOW (ref 1.4–7.7)
Neutrophils Relative %: 28.9 % — ABNORMAL LOW (ref 43.0–77.0)
Platelets: 271 10*3/uL (ref 150.0–400.0)
RBC: 4.34 Mil/uL (ref 3.87–5.11)
RDW: 13 % (ref 11.5–15.5)
WBC: 4.5 10*3/uL (ref 4.0–10.5)

## 2019-02-02 NOTE — Progress Notes (Signed)
Patient ID: Angel Taylor, female   DOB: May 21, 1955, 63 y.o.   MRN: [id number]   Subjective:    Patient ID: Angel Taylor, female    DOB: [birth year], 63 y.o.   MRN: [id number]  HPI  Patient here for her physical exam.  She reports she is doing relatively well.  Is having some neck pain.  Discomfort when turning her head.  No headache.  No sinus congestion.  No chest pain.  No sob.  No acid reflux.  No abdominal pain. Some issues previously with constipation.  Stable.  Just had colonoscopy.   Discussed last labs.  Recheck cbc today.  Increased stress.  Discussed with her today.  Does not feel needs any further intervention.      Past Medical History:  Diagnosis Date  . Anemia   . Chronic headaches   . Frequent UTI   . GERD (gastroesophageal reflux disease)   . Pre-eclampsia    Past Surgical History:  Procedure Laterality Date  . ABDOMINAL HYSTERECTOMY     total  . CESAREAN SECTION  1987  . TUBAL LIGATION  1987   Family History  Problem Relation Age of Onset  . Diabetes Mother   . Alzheimer's disease Mother   . Seizures Mother   . Heart disease Father   . Breast cancer Neg Hx    Social History   Socioeconomic History  . Marital status: Married    Spouse name: Not on file  . Number of children: 2  . Years of education: Not on file  . Highest education level: Not on file  Occupational History  . Not on file  Social Needs  . Financial resource strain: Not on file  . Food insecurity    Worry: Not on file    Inability: Not on file  . Transportation needs    Medical: Not on file    Non-medical: Not on file  Tobacco Use  . Smoking status: Never Smoker  . Smokeless tobacco: Never Used  Substance and Sexual Activity  . Alcohol use: No    Alcohol/week: 0.0 standard drinks  . Drug use: No  . Sexual activity: Not on file  Lifestyle  . Physical activity    Days per week: Not on file    Minutes per session: Not on file  . Stress: Not on file  Relationships  . Social  Herbalist on phone: Not on file    Gets together: Not on file    Attends religious service: Not on file    Active member of club or organization: Not on file    Attends meetings of clubs or organizations: Not on file    Relationship status: Not on file  Other Topics Concern  . Not on file  Social History Narrative  . Not on file    Outpatient Encounter Medications as of 02/02/2019  Medication Sig  . latanoprost (XALATAN) 0.005 % ophthalmic solution 1 drop at bedtime.  . timolol (BETIMOL) 0.25 % ophthalmic solution Place 1 drop into the left eye daily.   No facility-administered encounter medications on file as of 02/02/2019.    Review of Systems  Constitutional: Negative for appetite change and unexpected weight change.  HENT: Negative for congestion and sinus pressure.   Eyes: Negative for pain and visual disturbance.  Respiratory: Negative for cough, chest tightness and shortness of breath.   Cardiovascular: Negative for chest pain, palpitations and leg swelling.  Gastrointestinal: Negative for abdominal pain, diarrhea, nausea  and vomiting.  Genitourinary: Negative for difficulty urinating and dysuria.  Musculoskeletal: Positive for neck pain. Negative for joint swelling.  Skin: Negative for color change and rash.  Neurological: Negative for dizziness, light-headedness and headaches.  Hematological: Negative for adenopathy. Does not bruise/bleed easily.  Psychiatric/Behavioral: Negative for agitation and dysphoric mood.       Increased stress as outlined.         Objective:    Physical Exam Constitutional:      General: She is not in acute distress.    Appearance: Normal appearance. She is well-developed.  HENT:     Head: Normocephalic and atraumatic.     Right Ear: External ear normal.     Left Ear: External ear normal.  Eyes:     General: No scleral icterus.       Right eye: No discharge.        Left eye: No discharge.     Conjunctiva/sclera:  Conjunctivae normal.  Neck:     Musculoskeletal: Neck supple. No muscular tenderness.     Thyroid: No thyromegaly.  Cardiovascular:     Rate and Rhythm: Normal rate and regular rhythm.  Pulmonary:     Effort: No tachypnea, accessory muscle usage or respiratory distress.     Breath sounds: Normal breath sounds. No decreased breath sounds or wheezing.  Chest:     Breasts:        Right: No inverted nipple, mass, nipple discharge or tenderness (no axillary adenopathy).        Left: No inverted nipple, mass, nipple discharge or tenderness (no axilarry adenopathy).  Abdominal:     General: Bowel sounds are normal.     Palpations: Abdomen is soft.     Tenderness: There is no abdominal tenderness.  Genitourinary:    Comments: Normal external genitalia.  Vaginal vault without lesions.  S/p hysterectomy. Pap smear performed.  Could not appreciate any adnexal masses or tenderness.   Musculoskeletal:        General: No swelling or tenderness.     Comments: Some increased discomfort - neck - with rotation of her head.    Lymphadenopathy:     Cervical: No cervical adenopathy.  Skin:    Findings: No erythema or rash.  Neurological:     General: No focal deficit present.     Mental Status: She is alert and oriented to person, place, and time.  Psychiatric:        Mood and Affect: Mood normal.        Behavior: Behavior normal.     BP 140/90   Pulse 64   Temp (!) 97.3 F (36.3 C)   Resp 16   Ht 5\' 2"  (1.575 m)   Wt 115 lb 12.8 oz (52.5 kg)   SpO2 99%   BMI 21.18 kg/m  Wt Readings from Last 3 Encounters:  02/02/19 115 lb 12.8 oz (52.5 kg)  05/13/16 116 lb 6.4 oz (52.8 kg)  02/23/16 117 lb 3.2 oz (53.2 kg)     Lab Results  Component Value Date   WBC 4.5 02/02/2019   HGB 13.3 02/02/2019   HCT 40.9 02/02/2019   PLT 271.0 02/02/2019   GLUCOSE 76 11/20/2018   CHOL 210 (H) 11/20/2018   TRIG 70.0 11/20/2018   HDL 76.00 11/20/2018   LDLDIRECT 119.7 05/30/2012   LDLCALC 120 (H)  11/20/2018   ALT 14 11/20/2018   AST 17 11/20/2018   NA 138 11/20/2018   K 3.8 11/20/2018   CL  102 11/20/2018   CREATININE 0.89 11/20/2018   BUN 14 11/20/2018   CO2 27 11/20/2018   TSH 1.44 11/20/2018    Mm 3d Screen Breast Bilateral  Result Date: 05/11/2018 CLINICAL DATA:  Screening. EXAM: DIGITAL SCREENING BILATERAL MAMMOGRAM WITH TOMO AND CAD COMPARISON:  Previous exam(s). ACR Breast Density Category b: There are scattered areas of fibroglandular density. FINDINGS: There are no findings suspicious for malignancy. Images were processed with CAD. IMPRESSION: No mammographic evidence of malignancy. A result letter of this screening mammogram will be mailed directly to the patient. RECOMMENDATION: Screening mammogram in one year. (Code:SM-B-01Y) BI-RADS CATEGORY  1: Negative. Electronically Signed   By: Kristopher Oppenheim M.D.   On: 05/11/2018 13:46       Assessment & Plan:   Problem List Items Addressed This Visit    Degenerative disc disease, cervical    Has had issues in the past with her neck.  Increased pain as outlined.  She was questioning therapy.  Check c-spine xray.        Elevated blood pressure reading    Elevated.  Have her spot check her pressure.  Get her back in soon to reassess.  If persistent elevation, will require medication.        Fatty liver    Previous ultrasound revealed fatty liver.  Follow liver function tests.        GERD (gastroesophageal reflux disease)    Doing well on protonix.       Health care maintenance    Physical today 02/02/19.  PAP 02/02/19.  Mammogram 05/11/18 - Briads I.  Colonoscopy - per note, just had colonoscopy 06/2018.  Need results.        Neck pain - Primary   Relevant Orders   DG Cervical Spine 2 or 3 views (Completed)   Stress    Discussed with her today. Does not feel she needs any further treatment at this time.  Follow.         Other Visit Diagnoses    Lymphocytosis       Relevant Orders   CBC with Differential/Platelet  (Completed)   Cervical cancer screening       Relevant Orders   Cytology - PAP( [facility]) (Completed)   Routine general medical examination at a health care facility           Einar Pheasant, MD

## 2019-02-07 ENCOUNTER — Telehealth: Payer: Self-pay | Admitting: Internal Medicine

## 2019-02-07 ENCOUNTER — Encounter: Payer: Self-pay | Admitting: Internal Medicine

## 2019-02-07 LAB — CYTOLOGY - PAP
Comment: NEGATIVE
Diagnosis: NEGATIVE
High risk HPV: NEGATIVE

## 2019-02-07 NOTE — Telephone Encounter (Signed)
-----   Message from Cammie Sickle, MD sent at 02/06/2019  7:31 AM EST ----- Regarding: RE: question I'm doing well; hope you are too.  Review of blood counts show-slight increase in lymphocyte percentage; but still normal absolute lymphocyte count.  If patient has persistent elevation of absolute lymphocyte count-we will be happy to evaluate the patient for CLL/other lymphoproliferative disorders.   Thanks, GB  ----- Message ----- From: Einar Pheasant, MD Sent: 02/06/2019   5:39 AM EST To: Cammie Sickle, MD Subject: question                                       Ms Angel Taylor has had a persistent elevation in her lymphocytes.  Do you recommend any further evaluation or work up?  Thank you for your help.  I hope you are doing well.    Thanks.  Einar Pheasant

## 2019-02-08 ENCOUNTER — Encounter: Payer: Self-pay | Admitting: Internal Medicine

## 2019-02-08 DIAGNOSIS — H401131 Primary open-angle glaucoma, bilateral, mild stage: Secondary | ICD-10-CM | POA: Diagnosis not present

## 2019-02-08 DIAGNOSIS — H2513 Age-related nuclear cataract, bilateral: Secondary | ICD-10-CM | POA: Diagnosis not present

## 2019-02-10 ENCOUNTER — Encounter: Payer: Self-pay | Admitting: Internal Medicine

## 2019-02-10 NOTE — Assessment & Plan Note (Signed)
Has had issues in the past with her neck.  Increased pain as outlined.  She was questioning therapy.  Check c-spine xray.

## 2019-02-10 NOTE — Assessment & Plan Note (Signed)
Physical today 02/02/19.  PAP 02/02/19.  Mammogram 05/11/18 - Briads I.  Colonoscopy - per note, just had colonoscopy 06/2018.  Need results.

## 2019-02-10 NOTE — Assessment & Plan Note (Signed)
Previous ultrasound revealed fatty liver.  Follow liver function tests.   

## 2019-02-10 NOTE — Assessment & Plan Note (Addendum)
Discussed with her today. Does not feel she needs any further treatment at this time.  Follow.

## 2019-02-10 NOTE — Assessment & Plan Note (Signed)
Elevated.  Have her spot check her pressure.  Get her back in soon to reassess.  If persistent elevation, will require medication.

## 2019-02-10 NOTE — Assessment & Plan Note (Signed)
Doing well on protonix. 

## 2019-02-14 ENCOUNTER — Encounter: Payer: Self-pay | Admitting: Internal Medicine

## 2019-02-23 DIAGNOSIS — H401131 Primary open-angle glaucoma, bilateral, mild stage: Secondary | ICD-10-CM | POA: Diagnosis not present

## 2019-02-23 DIAGNOSIS — H2513 Age-related nuclear cataract, bilateral: Secondary | ICD-10-CM | POA: Diagnosis not present

## 2019-02-26 ENCOUNTER — Other Ambulatory Visit: Payer: Self-pay | Admitting: Internal Medicine

## 2019-02-26 DIAGNOSIS — M542 Cervicalgia: Secondary | ICD-10-CM

## 2019-02-26 NOTE — Progress Notes (Signed)
Order placed for ortho referral.   

## 2019-03-08 DIAGNOSIS — H2513 Age-related nuclear cataract, bilateral: Secondary | ICD-10-CM | POA: Diagnosis not present

## 2019-03-08 DIAGNOSIS — H401131 Primary open-angle glaucoma, bilateral, mild stage: Secondary | ICD-10-CM | POA: Diagnosis not present

## 2019-03-21 DIAGNOSIS — Z03818 Encounter for observation for suspected exposure to other biological agents ruled out: Secondary | ICD-10-CM | POA: Diagnosis not present

## 2019-03-21 DIAGNOSIS — Z20828 Contact with and (suspected) exposure to other viral communicable diseases: Secondary | ICD-10-CM | POA: Diagnosis not present

## 2019-04-03 ENCOUNTER — Ambulatory Visit: Payer: BC Managed Care – PPO | Admitting: Internal Medicine

## 2019-04-16 ENCOUNTER — Other Ambulatory Visit: Payer: Self-pay | Admitting: Internal Medicine

## 2019-04-16 ENCOUNTER — Encounter: Payer: Self-pay | Admitting: Internal Medicine

## 2019-04-16 DIAGNOSIS — Z1231 Encounter for screening mammogram for malignant neoplasm of breast: Secondary | ICD-10-CM

## 2019-05-25 ENCOUNTER — Ambulatory Visit
Admission: RE | Admit: 2019-05-25 | Discharge: 2019-05-25 | Disposition: A | Discharge status: Home / Self Care | Payer: BC Managed Care – PPO | Source: Ambulatory Visit | Attending: Internal Medicine | Admitting: Internal Medicine

## 2019-05-25 DIAGNOSIS — Z1231 Encounter for screening mammogram for malignant neoplasm of breast: Secondary | ICD-10-CM | POA: Insufficient documentation

## 2019-06-02 ENCOUNTER — Ambulatory Visit: Payer: BC Managed Care – PPO

## 2019-06-02 ENCOUNTER — Ambulatory Visit: Payer: BC Managed Care – PPO | Attending: Internal Medicine

## 2019-06-02 DIAGNOSIS — Z23 Encounter for immunization: Secondary | ICD-10-CM | POA: Insufficient documentation

## 2019-06-02 NOTE — Progress Notes (Addendum)
   Covid-19 Vaccination Clinic  Name:  Angel Taylor    MRN: [id number] DOB: 1955/04/21  06/02/2019  [name] was observed post Covid-19 immunization for 30 minutes based on pre-vaccination screening without incidence. She was provided with Vaccine Information Sheet and instruction to access the V-Safe system.   [name] was instructed to call 911 with any severe reactions post vaccine: Marland Kitchen Difficulty breathing  . Swelling of your face and throat  . A fast heartbeat  . A bad rash all over your body  . Dizziness and weakness   Pt was in observation for more than 30 mins due to feeling lightheaded and weak. She was given water and a pack of cracker because she didn't eat this morning. 165/87 and pulse was 67. Staff walked her to her car and her husband was driving.  Immunizations Administered    Name Date Dose VIS Date Route   Moderna COVID-19 Vaccine 06/02/2019  3:03 PM 0.5 mL 03/06/2019 Intramuscular   Manufacturer: Moderna   Lot: [id number]   KerseyDW:[id number]

## 2019-06-30 ENCOUNTER — Ambulatory Visit: Payer: Self-pay | Attending: Internal Medicine

## 2019-06-30 DIAGNOSIS — Z23 Encounter for immunization: Secondary | ICD-10-CM

## 2019-06-30 NOTE — Progress Notes (Signed)
   Covid-19 Vaccination Clinic  Name:  Fanita Myklebust    MRN: [id number] DOB: Oct 07, 1955  06/30/2019  Ms. Hommel was observed post Covid-19 immunization for 15 minutes without incident. She was provided with Vaccine Information Sheet and instruction to access the V-Safe system.   Ms. Cassone was instructed to call 911 with any severe reactions post vaccine: Marland Kitchen Difficulty breathing  . Swelling of face and throat  . A fast heartbeat  . A bad rash all over body  . Dizziness and weakness   Immunizations Administered    Name Date Dose VIS Date Route   Moderna COVID-19 Vaccine 06/30/2019 12:25 PM 0.5 mL 03/06/2019 Intramuscular   Manufacturer: Levan Hurst   Lot: [id number]   [place]: [id number]

## 2020-01-31 ENCOUNTER — Ambulatory Visit
Admission: EM | Admit: 2020-01-31 | Discharge: 2020-01-31 | Disposition: A | Discharge status: Home / Self Care | Payer: 59 | Attending: Emergency Medicine | Admitting: Emergency Medicine

## 2020-01-31 ENCOUNTER — Other Ambulatory Visit: Payer: Self-pay

## 2020-01-31 ENCOUNTER — Telehealth: Payer: Self-pay | Admitting: Internal Medicine

## 2020-01-31 ENCOUNTER — Emergency Department: Payer: 59

## 2020-01-31 ENCOUNTER — Observation Stay
Admission: EM | Admit: 2020-01-31 | Discharge: 2020-02-02 | Disposition: A | Discharge status: Home / Self Care | Payer: 59 | Attending: Internal Medicine | Admitting: Internal Medicine

## 2020-01-31 DIAGNOSIS — R030 Elevated blood-pressure reading, without diagnosis of hypertension: Secondary | ICD-10-CM | POA: Diagnosis not present

## 2020-01-31 DIAGNOSIS — R9431 Abnormal electrocardiogram [ECG] [EKG]: Secondary | ICD-10-CM | POA: Diagnosis present

## 2020-01-31 DIAGNOSIS — R778 Other specified abnormalities of plasma proteins: Secondary | ICD-10-CM | POA: Diagnosis present

## 2020-01-31 DIAGNOSIS — R079 Chest pain, unspecified: Secondary | ICD-10-CM | POA: Diagnosis present

## 2020-01-31 DIAGNOSIS — K219 Gastro-esophageal reflux disease without esophagitis: Secondary | ICD-10-CM | POA: Diagnosis not present

## 2020-01-31 DIAGNOSIS — Z20822 Contact with and (suspected) exposure to covid-19: Secondary | ICD-10-CM | POA: Diagnosis not present

## 2020-01-31 DIAGNOSIS — I10 Essential (primary) hypertension: Secondary | ICD-10-CM | POA: Diagnosis not present

## 2020-01-31 DIAGNOSIS — R0789 Other chest pain: Principal | ICD-10-CM | POA: Insufficient documentation

## 2020-01-31 DIAGNOSIS — R7989 Other specified abnormal findings of blood chemistry: Secondary | ICD-10-CM | POA: Diagnosis not present

## 2020-01-31 LAB — COMPREHENSIVE METABOLIC PANEL
ALT: 15 U/L (ref 0–44)
AST: 25 U/L (ref 15–41)
Albumin: 4.6 g/dL (ref 3.5–5.0)
Alkaline Phosphatase: 56 U/L (ref 38–126)
Anion gap: 11 (ref 5–15)
BUN: 11 mg/dL (ref 8–23)
CO2: 25 mmol/L (ref 22–32)
Calcium: 9.7 mg/dL (ref 8.9–10.3)
Chloride: 101 mmol/L (ref 98–111)
Creatinine, Ser: 0.82 mg/dL (ref 0.44–1.00)
GFR, Estimated: >60 mL/min (ref >60)
Glucose, Bld: 129 mg/dL — ABNORMAL HIGH (ref 70–99)
Potassium: 3.8 mmol/L (ref 3.5–5.1)
Sodium: 137 mmol/L (ref 135–145)
Total Bilirubin: 1 mg/dL (ref 0.3–1.2)
Total Protein: 8 g/dL (ref 6.5–8.1)

## 2020-01-31 LAB — CBC
HCT: 42.4 % (ref 36.0–46.0)
Hemoglobin: 13.7 g/dL (ref 12.0–15.0)
MCH: 30.6 pg (ref 26.0–34.0)
MCHC: 32.3 g/dL (ref 30.0–36.0)
MCV: 94.6 fL (ref 80.0–100.0)
Platelets: 289 10*3/uL (ref 150–400)
RBC: 4.48 MIL/uL (ref 3.87–5.11)
RDW: 12.9 % (ref 11.5–15.5)
WBC: 4.9 10*3/uL (ref 4.0–10.5)
nRBC: 0 % (ref 0.0–0.2)

## 2020-01-31 LAB — MAGNESIUM: Magnesium: 2.2 mg/dL (ref 1.7–2.4)

## 2020-01-31 LAB — RESPIRATORY PANEL BY RT PCR (FLU A&B, COVID)
Influenza A by PCR: NEGATIVE
Influenza B by PCR: NEGATIVE
SARS Coronavirus 2 by RT PCR: NEGATIVE

## 2020-01-31 LAB — TROPONIN I (HIGH SENSITIVITY)
Troponin I (High Sensitivity): 45 ng/L — ABNORMAL HIGH (ref <18)
Troponin I (High Sensitivity): 112 ng/L (ref <18)

## 2020-01-31 MED ORDER — ACETAMINOPHEN 325 MG PO TABS
650.0000 mg | ORAL_TABLET | Freq: Four times a day (QID) | ORAL | Status: DC | PRN
  Administered 2020-02-01: 650 mg via ORAL
  Filled 2020-01-31: qty 2

## 2020-01-31 MED ORDER — ACETAMINOPHEN 650 MG RE SUPP: 650.0000 mg | Freq: Four times a day (QID) | RECTAL | Status: DC | PRN

## 2020-01-31 MED ORDER — TIMOLOL MALEATE 0.25 % OP SOLN
1.0000 [drp] | Freq: Every day | OPHTHALMIC | Status: DC
  Administered 2020-02-01 – 2020-02-02 (×2): 1 [drp] via OPHTHALMIC
  Filled 2020-01-31: qty 5

## 2020-01-31 MED ORDER — LORAZEPAM 0.5 MG PO TABS
0.5000 mg | ORAL_TABLET | Freq: Four times a day (QID) | ORAL | Status: DC | PRN
  Administered 2020-02-01: 0.5 mg via ORAL
  Filled 2020-01-31: qty 1

## 2020-01-31 MED ORDER — LATANOPROST 0.005 % OP SOLN
1.0000 [drp] | Freq: Every day | OPHTHALMIC | Status: DC
  Filled 2020-01-31: qty 2.5

## 2020-01-31 MED ORDER — ENOXAPARIN SODIUM 40 MG/0.4ML ~~LOC~~ SOLN
40.0000 mg | SUBCUTANEOUS | Status: DC
  Administered 2020-02-01 – 2020-02-02 (×2): 40 mg via SUBCUTANEOUS
  Filled 2020-01-31 (×2): qty 0.4

## 2020-01-31 MED ORDER — ONDANSETRON HCL 4 MG PO TABS: 4.0000 mg | ORAL_TABLET | Freq: Four times a day (QID) | ORAL | Status: DC | PRN

## 2020-01-31 MED ORDER — ONDANSETRON HCL 4 MG/2ML IJ SOLN: 4.0000 mg | Freq: Four times a day (QID) | INTRAMUSCULAR | Status: DC | PRN

## 2020-01-31 MED ORDER — NITROGLYCERIN 0.4 MG SL SUBL: 0.4000 mg | SUBLINGUAL_TABLET | SUBLINGUAL | Status: DC | PRN

## 2020-01-31 MED ORDER — ASPIRIN 81 MG PO CHEW
324.0000 mg | CHEWABLE_TABLET | Freq: Once | ORAL | Status: AC
  Administered 2020-01-31: 324 mg via ORAL
  Filled 2020-01-31: qty 4

## 2020-01-31 MED ORDER — FAMOTIDINE 20 MG PO TABS
20.0000 mg | ORAL_TABLET | Freq: Every day | ORAL | Status: DC
  Administered 2020-01-31 – 2020-02-02 (×3): 20 mg via ORAL
  Filled 2020-01-31 (×3): qty 1

## 2020-01-31 NOTE — H&P (Signed)
History and Physical    PLEASE NOTE THAT DRAGON DICTATION SOFTWARE WAS USED IN THE CONSTRUCTION OF THIS NOTE.   Angel Taylor [id number] DOB: July 28, 1955 DOA: 01/31/2020  PCP: Einar Pheasant, MD Patient coming from: home   I have personally briefly reviewed patient's old medical records in [place]  Chief Complaint: Chest pain  HPI: Angel Taylor is a 64 y.o. female with medical history significant for GERD, recurrent urinary tract infections, who is admitted to [facility] on 01/31/2020 for further evaluation and management of chest pain after presenting to [facility] emergency c/o chest discomfort.   The patient reports 4-5 episodes of nonradiating substernal burning over the course of the last week.  Reports that the discomfort is nonexertional, nonpleuritic, and nonpositional.  She has not taken any nitroglycerin to be able to evaluate response to this medication.  She also notes that the chest discomfort is nonreproducible with direct palpation of the anterior chest wall.  She denies any recent trauma.  She specifically denies any radiation of the chest discomfort into the back or between the shoulder blades.  Denies any associated shortness of breath, palpitations, diaphoresis, nausea, vomiting, or dizziness.  She reports that the chest discomfort is similar to that which she has experienced previously with acid reflux, and notes that multiple of these episodes of discomfort have occurred in the morning, but have not woken her from sleep. She has not noticed any significant exacerbation with exertion, and is not brought on any episode of chest discomfort.   Denies any recent travel, trauma, or periods of diminished ambulatory status.  Denies any associated hemoptysis.  Has not noticed any recent new onset peripheral edema, edema, or calf tenderness.  Denies any personal or family history of DVT/PE.  Denies any recent subjective fever,  chills, rigors, or generalized myalgias.  Denies any associated shortness of breath or cough. No known COVID-19 exposures.  She reports progressive stressors at her work over the last month, and has recently noticed elevation of her systolic blood pressures into the 150s in the context of no established history of hypertension.  She reports that she would prefer to engage in lifestyle modifications to reduce her blood pressure as opposed to being started on blood pressure medication if avoidable.  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   In terms of past medical history CAD risk factors, the patient denies any formally established diagnosis of hypertension, hyperlipidemia, type 2 diabetes mellitus, and reports that she is a lifelong non-smoker.  It appears that her most recent ischemic evaluation occurred in December 2012, at which time she underwent exercise stress test that did not show any evidence of reversible ischemia at that time.     ED Course:  Vital signs in the ED were notable for the following: Temperature max 98; blood pressure 72-84; BP 141/81-150 7/92; respiratory rate 14-20, and oxygen saturation 99 to 100% on RA.   Labs were notable for the following: CMP notable for the following: Sodium 137, potassium 3.8, bicarbonate 25, BUN 11, creatinine 0.85.  Initial high-sensitivity troponin I found to be 45.  CBC notable for white cell count of 4900 with hemoglobin of 13.7.  Nasopharyngeal COVID-19 PCR swab PCR performed in the ED today, and found to be negative.  Chest x-ray showed no evidence of acute cardiopulmonary process, including no evidence of  infiltrate, edema, effusion, or pneumothorax.  EKG showed sinus rhythm with heart rate 65, nonspecific less than 1 mm ST depression in V5/V6, which appears new relative to most recent prior EKG in  2012, but no evidence of ST elevation. no evidence of T wave changes.  While in the ED, the following were administered: Full dose aspirin x1.  Subsequently, the patient was admitted for overnight observation to the med telemetry floor for further evaluation presenting chest discomfort in the context of the depression in lateral leads.     Review of Systems: As per HPI otherwise 10 point review of systems negative.   Past Medical History:  Diagnosis Date   Anemia    Chronic headaches    Frequent UTI    GERD (gastroesophageal reflux disease)    Pre-eclampsia     Past Surgical History:  Procedure Laterality Date   ABDOMINAL HYSTERECTOMY     total   CESAREAN SECTION  1987   TUBAL LIGATION  1987    Social History:  reports that she has never smoked. She has never used smokeless tobacco. She reports that she does not drink alcohol and does not use drugs.   Allergies  Allergen Reactions   Bactrim [Sulfamethoxazole-Trimethoprim]    Macrobid [Nitrofurantoin Macrocrystal]    Sulfa Antibiotics     Family History  Problem Relation Age of Onset   Diabetes Mother    Alzheimer's disease Mother    Seizures Mother    Heart disease Father    Breast cancer Neg Hx      Prior to Admission medications   Medication Sig Start Date End Date Taking? Authorizing Provider  latanoprost (XALATAN) 0.005 % ophthalmic solution 1 drop at bedtime.    [provider]  timolol (BETIMOL) 0.25 % ophthalmic solution Place 1 drop into the left eye daily.    [provider]     Objective    Physical Exam: Vitals:   01/31/20 1256 01/31/20 1304  BP:  (!) 153/92  Pulse:  77  Resp:  18  Temp:  98 F (36.7 C)  TempSrc:  Oral  SpO2:  99%  Weight: 51.7 kg   Height: 5\' 2"  (1.575 m)     General: appears to be stated age; alert, oriented Skin: warm, dry, no rash Head:  AT/[place] Mouth:  Oral mucosa membranes appear moist, normal dentition Neck: supple; trachea  midline Heart:  RRR; did not appreciate any M/R/G Lungs: CTAB, did not appreciate any wheezes, rales, or rhonchi Abdomen: + BS; soft, ND, NT Vascular: 2+ pedal pulses b/l; 2+ radial pulses b/l Extremities: no peripheral edema, no muscle wasting Neuro: strength and sensation intact in upper and lower extremities b/l    Labs on Admission: I have personally reviewed following labs and imaging studies  CBC: Recent Labs  Lab 01/31/20 1315  WBC 4.9  HGB 13.7  HCT 42.4  MCV 94.6  PLT 756   Basic Metabolic Panel: Recent Labs  Lab 01/31/20 1315  NA 137  K 3.8  CL 101  CO2 25  GLUCOSE 129*  BUN 11  CREATININE 0.82  CALCIUM 9.7   GFR: Estimated Creatinine Clearance: 56.3 mL/min (by C-G formula based on SCr of 0.82 mg/dL). Liver Function Tests: Recent Labs  Lab 01/31/20 1315  AST 25  ALT 15  ALKPHOS 56  BILITOT 1.0  PROT 8.0  ALBUMIN 4.6   No results for input(s): LIPASE, AMYLASE in the last 168 hours. No results for input(s): AMMONIA in the last 168  hours. Coagulation Profile: No results for input(s): INR, PROTIME in the last 168 hours. Cardiac Enzymes: No results for input(s): CKTOTAL, CKMB, CKMBINDEX, TROPONINI in the last 168 hours. BNP (last 3 results) No results for input(s): PROBNP in the last 8760 hours. HbA1C: No results for input(s): HGBA1C in the last 72 hours. CBG: No results for input(s): GLUCAP in the last 168 hours. Lipid Profile: No results for input(s): CHOL, HDL, LDLCALC, TRIG, CHOLHDL, LDLDIRECT in the last 72 hours. Thyroid Function Tests: No results for input(s): TSH, T4TOTAL, FREET4, T3FREE, THYROIDAB in the last 72 hours. Anemia Panel: No results for input(s): VITAMINB12, FOLATE, FERRITIN, TIBC, IRON, RETICCTPCT in the last 72 hours. Urine analysis:    Component Value Date/Time   COLORURINE YELLOW 02/23/2016 [place] 02/23/2016 1119   LABSPEC <=1.005 (A) 02/23/2016 1119   PHURINE 6.0 02/23/2016 1119   GLUCOSEU  NEGATIVE 02/23/2016 1119   HGBUR NEGATIVE 02/23/2016 1119   BILIRUBINUR NEGATIVE 02/23/2016 1119   BILIRUBINUR neg 10/15/2013 0914   KETONESUR NEGATIVE 02/23/2016 1119   PROTEINUR neg 10/15/2013 0914   UROBILINOGEN 0.2 02/23/2016 1119   NITRITE NEGATIVE 02/23/2016 1119   LEUKOCYTESUR NEGATIVE 02/23/2016 1119    Radiological Exams on Admission: No results found.   EKG: Independently reviewed, with result as described above.    Assessment/Plan   Angel Taylor is a 64 y.o. female with medical history significant for GERD, recurrent urinary tract infections, who is admitted to [facility] on 01/31/2020 for further evaluation and management of chest pain after presenting to [facility] emergency c/o chest discomfort.   Active Problems:   GERD (gastroesophageal reflux disease)   Elevated blood pressure reading   Atypical chest pain   Elevated troponin    #) Atypical chest pain: 4-5 episodes over the last week of burning chest discomfort over the sternum that is nonexertional in nature.  No known personal history of coronary disease, with most recent ischemic evaluation occurring in 2012 at which time exercise stress test showed no evidence of reversible ischemia.  While presenting chest discomfort appears atypical in nature in this patient with limited CAD risk factors, there was mild elevation in her initial troponin value along with nonspecific less than 1 mm ST depression found in V5/V6 on the initial EKG in the absence of any associated ST elevation.  The significance of these findings is currently unclear, and the patient has been completely chest pain-free while in the hospital.  We will check echocardiogram in the morning to evaluate the presence of focal wall motion abnormalities if present, attempt to correlate the distribution of these findings with the nonspecific ST depression V5/V6 noted on initial EKG. in terms of noncardiac etiologies  that could contribute to the patient's chest discomfort in the context of her report of recent segment of high blood pressures, the differential also includes aortic dissection.  While she denies any classic radiation of her chest pain or between the shoulder blades, will check a D-dimer in order to try to take advantage of its high negative predictive value as it relates to probability of both aortic dissection as well as acute pulmonary embolism.  Should D-dimer be found to be elevated, would anticipate proceeding with a CTA of the chest at that point.  The patient received a full dose aspirin upon arrival in the ED. differential for the patient's presenting atypical chest pain also includes GERD vs anxiety vs musculoskeletal etiologies, including costochondritis.  Plan: Continue to trend  serial troponin, with next value to be checked in the morning.  As needed sublingual nitroglycerin.  Monitor on telemetry.  Echocardiogram in the morning.  Check D-dimer, with consideration for proceeding with CTA of the chest to rule out aortic dissection versus acute pulmonary embolism if this value is found to be elevated.  Repeat CBC in the morning.  As needed EKG should the patient develop any subsequent chest discomfort.  Given the patient's report of some similarities in her presenting chest discomfort to discomfort that she has experienced with previous episodes of GERD, I have ordered famotidine.  Given her chest discomforts association with anxiety, I also ordered as needed Ativan for any subsequent episodes of chest discomfort that are not improved with sublingual nitroglycerin.     #) Troponemia: Mildly elevated initial troponin in the context of presenting atypical chest pain.  Suspect that this is more likely to be on the basis of a currently unclear source of supply demand mismatch as opposed to representing a type I process due to acute plaque rupture.  Of note, the patient does not experienced any chest  discomfort since arriving in the ED today. should she develop subsequent episodes of chest discomfort or should her serial troponin values subsequently increased, would consider initiation of heparin drip, given the nonspecific ST depression findings noted on initial EKG. Will repeat EKG now to evaluate any interval change in the less than 1 mm ST depression noted in V5/V6 on presenting EKG. as stated above, the patient received a full dose aspirin upon arrival in the ED.  Plan: Repeat high-sensitivity troponin I in the morning.  Monitor on telemetry.  Echocardiogram in the morning to evaluate for any focal wall motion abnormalities, with ensuing attempt this distribution with the ST depression found in V5/V6 upon presentation.  Repeat EKG in the morning.  As needed EKG for any additional chest discomfort.  Additional work-up and management of atypical chest pain, cleaning checking D-dimer, with plan to pursue CT of the chest should this value proved to be elevated.     #) Elevated blood pressure: in the absence of formal diagnosis of hypertension.  Specifically, the patient has been noting systolic blood pressures into the 150s over the last few days, which she states is new for.  Systolic blood pressures since presenting to the emergency department have been noted to be in the 140s to 150s mmHg, without evidence of hypotensive values.  It does not appear that sufficient time has occurred between her values in order to establish a true diagnosis of hypertension.  Of note, per JNC 8 hypertensive guidelines, for this patient who is 64 years old in the absence of any history of goal blood pressure would be less than 809 mmHg systolic and less than 90 diastolic mmHg. while the patient is hesitant regarding the initiation of antihypertensive medications, would consider initiation of inpatient beta-blocker in an acute sense if troponin continues to rise or if the patient should develop subsequent chest discomfort.   The patient's blood pressure, while elevated, does not appear to have a been associated with much higher systolic values at which time I would be slightly more concerned about the possibility of an aortic dissection given presentation of chest pain concomitant with increased blood pressure.  Should subsequent clinical suspicion that possibility, could consider obtaining a D-dimer for its high negative predictive value in the setting of evaluating for aortic dissection.  There may also be an element of patient's elevated blood pressure values.  Close monitoring of ensuing pressures via routine vital signs.  Check TSH.  As needed Ativan ordered for anxiety.  Additional work-up and management of mildly elevated initial troponin, as further described above, including the morning.  Monitor on telemetry.     #) GERD: The patient acknowledges a history of GERD for which she is not currently taking any H2 blocker or PPI.  Given the patient's report that her chest discomfort sometimes is associated with a burning sensation that is more likely to occur early in the morning, there is the possibility of patient's presenting chest discomfort.  While proton pump inhibitors are the more efficacious for longer-term GERD management, the H2 blocker will begin to work sooner.  Therefore, I will start famotidine now, and monitor for subsequent chest discomfort.   Plan: Start famotidine, based up above rationale.     DVT prophylaxis: Lovenox 4 mg subcu daily Code Status: Full code Family Communication: none Disposition Plan: Per Rounding Team Consults called: none  Admission status: Observation; med telemetry.     PLEASE NOTE THAT DRAGON DICTATION SOFTWARE WAS USED IN THE CONSTRUCTION OF THIS NOTE.   [facility] Pager [phone] From 12PM- 12AM  Otherwise, please contact night-coverage  [email or web address] Password [email or web address]  01/31/2020, 3:04 PM

## 2020-01-31 NOTE — Discharge Instructions (Addendum)
Go to the Emergency Department for evaluation of your chest discomfort, headache, elevated blood pressure, and EKG abnormality.

## 2020-01-31 NOTE — ED Provider Notes (Signed)
[facility] Emergency Department Provider Note   ____________________________________________    I have reviewed the triage vital signs and the nursing notes.   HISTORY  Chief Complaint Abnormal ECG     HPI Angel Taylor is a 64 y.o. female who presents with complaints of elevated blood pressure and chest discomfort.  Patient reports stress at work is caused her to feel out of sorts recently and she has had much higher blood pressure than she is used to.  She does not take anything for blood pressure.  Additionally she has had burning in her chest which seemed worse this morning than typical.  Denies fevers or chills.  No cough.  Has been vaccinated gets COVID-19.  No shortness of breath or pleurisy.  No history of heart disease  Past Medical History:  Diagnosis Date  . Anemia   . Chronic headaches   . Frequent UTI   . GERD (gastroesophageal reflux disease)   . Pre-eclampsia     Patient Active Problem List   Diagnosis Date Noted  . Fatty liver 02/29/2016  . Elevated blood pressure reading 02/29/2016  . Increased pressure in the eye 02/29/2016  . Neck nodule 02/29/2016  . Neck pain 02/23/2016  . Constipation 06/22/2014  . Stress 06/22/2014  . Health care maintenance 06/22/2014  . Rash 05/20/2014  . Cough 05/20/2014  . Fullness of breast 05/20/2014  . Costochondritis 05/11/2013  . URI (upper respiratory infection) 04/29/2013  . Degenerative disc disease, cervical 06/11/2012  . History of colonic polyps 06/11/2012  . GERD (gastroesophageal reflux disease) 06/11/2012    Past Surgical History:  Procedure Laterality Date  . ABDOMINAL HYSTERECTOMY     total  . CESAREAN SECTION  1987  . TUBAL LIGATION  1987    Prior to Admission medications   Medication Sig Start Date End Date Taking? Authorizing Provider  latanoprost (XALATAN) 0.005 % ophthalmic solution 1 drop at bedtime.    [provider]  timolol (BETIMOL) 0.25 %  ophthalmic solution Place 1 drop into the left eye daily.    [provider]     Allergies Bactrim [sulfamethoxazole-trimethoprim], Macrobid [nitrofurantoin macrocrystal], and Sulfa antibiotics  Family History  Problem Relation Age of Onset  . Diabetes Mother   . Alzheimer's disease Mother   . Seizures Mother   . Heart disease Father   . Breast cancer Neg Hx     Social History Social History   Tobacco Use  . Smoking status: Never Smoker  . Smokeless tobacco: Never Used  Substance Use Topics  . Alcohol use: No    Alcohol/week: 0.0 standard drinks  . Drug use: No    Review of Systems  Constitutional: No fever/chills Eyes: No visual changes.  ENT: No sore throat. Cardiovascular: As above Respiratory: Denies shortness of breath. Gastrointestinal: No abdominal pain.  No nausea, no vomiting.   Genitourinary: Negative for dysuria. Musculoskeletal: Negative for back pain. Skin: No diaphoresis Neurological: Negative for headaches or weakness   ____________________________________________   PHYSICAL EXAM:  VITAL SIGNS: ED Triage Vitals  Enc Vitals Group     BP 01/31/20 1304 (!) 153/92     Pulse Rate 01/31/20 1304 77     Resp 01/31/20 1304 18     Temp 01/31/20 1304 98 F (36.7 C)     Temp Source 01/31/20 1304 Oral     SpO2 01/31/20 1304 99 %     Weight 01/31/20 1256 51.7 kg (113 lb 15.7 oz)  Height 01/31/20 1256 1.575 m (5\' 2" )     Head Circumference --      Peak Flow --      Pain Score 01/31/20 1256 0     Pain Loc --      Pain Edu? --      Excl. in [place]? --     Constitutional: Alert and oriented.   Nose: No congestion/rhinnorhea. Mouth/Throat: Mucous membranes are moist.   Neck:  Painless ROM Cardiovascular: Normal rate, regular rhythm. Grossly normal heart sounds.  Good peripheral circulation. Respiratory: Normal respiratory effort.  No retractions. Lungs CTAB. Gastrointestinal: Soft and nontender. No distention.  No CVA  tenderness.  Musculoskeletal: No lower extremity tenderness nor edema.  Warm and well perfused Neurologic:  Normal speech and language. No gross focal neurologic deficits are appreciated.  Skin:  Skin is warm, dry and intact. No rash noted. Psychiatric: Mood and affect are normal. Speech and behavior are normal.  ____________________________________________   LABS (all labs ordered are listed, but only abnormal results are displayed)  Labs Reviewed  COMPREHENSIVE METABOLIC PANEL - Abnormal; Notable for the following components:      Result Value   Glucose, Bld 129 (*)    All other components within normal limits  TROPONIN I (HIGH SENSITIVITY) - Abnormal; Notable for the following components:   Troponin I (High Sensitivity) 45 (*)    All other components within normal limits  CBC   ____________________________________________  EKG  ED ECG REPORT I, Lavonia Drafts, the attending physician, personally viewed and interpreted this ECG.  Date: 01/31/2020  Rhythm: normal sinus rhythm QRS Axis: normal Intervals: normal ST/T Wave abnormalities: Possible ST depression laterally   ____________________________________________  RADIOLOGY  Chest x-ray viewed by me, no infiltrate or effusion ____________________________________________   PROCEDURES  Procedure(s) performed: No  .1-3 Lead EKG Interpretation Performed by: Lavonia Drafts, MD Authorized by: Lavonia Drafts, MD     Interpretation: normal     ECG rate assessment: normal     Rhythm: sinus rhythm     Ectopy: none     Conduction: normal       Critical Care performed: No ____________________________________________   INITIAL IMPRESSION / ASSESSMENT AND PLAN / ED COURSE  Pertinent labs & imaging results that were available during my care of the patient were reviewed by me and considered in my medical decision making (see chart for details).  Patient presents with high blood pressure and chest discomfort as  above.  EKG demonstrates possible depression laterally although her presentation is overall reassuring with a normal exam.  Lab work is notable however for an elevated troponin of 45, given her concerns about elevated blood pressure and chest discomfort this will need to be evaluated further.  No old values to compare this to, presumably it is new.  I have consulted the hospitalist service for admission.  Will give p.o. aspirin  No active chest pain at this time    ____________________________________________   FINAL CLINICAL IMPRESSION(S) / ED DIAGNOSES  Final diagnoses:  Chest pain, unspecified type  Elevated troponin  Hypertension, unspecified type        Note:  This document was prepared using Dragon voice recognition software and may include unintentional dictation errors.   Lavonia Drafts, MD 01/31/20 1504

## 2020-01-31 NOTE — Telephone Encounter (Signed)
Patient is currently in ED. Patient was sent from UC to ed because of abnormal ECG.

## 2020-01-31 NOTE — ED Triage Notes (Signed)
Pt reports having elevated BP over the last week. sts she has had a headache. Pt currently not taking BP meds. Pt was sent here by PCP for further evaluation.   Pt has new stressors at work.

## 2020-01-31 NOTE — Telephone Encounter (Addendum)
Patient called in about her blood pressure [facility] triage her advise her to go Urgent care if she was having any chest pain or shortness of breath she was not having any of these she checked her blood pressure  on Friday 140/90, Mon 175/100, wed 152/96, Thursday 160/91 first thing this morning and later 152/98, last checked 143/98 she stated that she would go to the urgent care just to be check out. She is not on medication she is scheduled for a virtual visit on 02-18-20 at 4pm

## 2020-01-31 NOTE — Telephone Encounter (Signed)
Patient currently in [facility].

## 2020-01-31 NOTE — ED Notes (Signed)
Pt ambulatory to desk c/o abnormal EKG from Urgent Care.

## 2020-01-31 NOTE — ED Triage Notes (Addendum)
Pt was sent here after she was seen at urgent care and was told her EKG had some abnormalities- pt also states she was having some "burning" in her chest

## 2020-01-31 NOTE — ED Provider Notes (Signed)
Angel Taylor    CSN: [id number] Arrival date & time: 01/31/20  1137      History   Chief Complaint Chief Complaint  Patient presents with  . Hypertension    HPI Angel Taylor is a 64 y.o. female.   Patient presents with headache and elevated blood pressure x1 week.  She also reports mild "burning" sensation in her chest after eating which she attributes to reflux.  She denies focal weakness, shortness of breath, lower extremity edema, or other symptoms.  No treatments attempted at home.  Her medical history includes elevated blood pressure, anemia, DDD, GERD, chronic headaches.  The history is provided by the patient and medical records.    Past Medical History:  Diagnosis Date  . Anemia   . Chronic headaches   . Frequent UTI   . GERD (gastroesophageal reflux disease)   . Pre-eclampsia     Patient Active Problem List   Diagnosis Date Noted  . Fatty liver 02/29/2016  . Elevated blood pressure reading 02/29/2016  . Increased pressure in the eye 02/29/2016  . Neck nodule 02/29/2016  . Neck pain 02/23/2016  . Constipation 06/22/2014  . Stress 06/22/2014  . Health care maintenance 06/22/2014  . Rash 05/20/2014  . Cough 05/20/2014  . Fullness of breast 05/20/2014  . Costochondritis 05/11/2013  . URI (upper respiratory infection) 04/29/2013  . Degenerative disc disease, cervical 06/11/2012  . History of colonic polyps 06/11/2012  . GERD (gastroesophageal reflux disease) 06/11/2012    Past Surgical History:  Procedure Laterality Date  . ABDOMINAL HYSTERECTOMY     total  . CESAREAN SECTION  1987  . TUBAL LIGATION  1987    OB History   No obstetric history on file.      Home Medications    Prior to Admission medications   Medication Sig Start Date End Date Taking? Authorizing Provider  latanoprost (XALATAN) 0.005 % ophthalmic solution 1 drop at bedtime.    [provider]  timolol (BETIMOL) 0.25 % ophthalmic solution Place 1 drop into the  left eye daily.    [provider]    Family History Family History  Problem Relation Age of Onset  . Diabetes Mother   . Alzheimer's disease Mother   . Seizures Mother   . Heart disease Father   . Breast cancer Neg Hx     Social History Social History   Tobacco Use  . Smoking status: Never Smoker  . Smokeless tobacco: Never Used  Substance Use Topics  . Alcohol use: No    Alcohol/week: 0.0 standard drinks  . Drug use: No     Allergies   Bactrim [sulfamethoxazole-trimethoprim], Macrobid [nitrofurantoin macrocrystal], and Sulfa antibiotics   Review of Systems Review of Systems  Constitutional: Negative for chills and fever.  HENT: Negative for ear pain and sore throat.   Eyes: Negative for pain and visual disturbance.  Respiratory: Negative for cough and shortness of breath.   Cardiovascular: Positive for chest pain. Negative for palpitations and leg swelling.  Gastrointestinal: Negative for abdominal pain and vomiting.  Genitourinary: Negative for dysuria and hematuria.  Musculoskeletal: Negative for arthralgias and back pain.  Skin: Negative for color change and rash.  Neurological: Positive for headaches. Negative for dizziness, tremors, seizures, syncope, facial asymmetry, speech difficulty, weakness, light-headedness and numbness.  All other systems reviewed and are negative.    Physical Exam Triage Vital Signs ED Triage Vitals  Enc Vitals Group     BP 01/31/20 1158 (!) 162/92  Pulse Rate 01/31/20 1158 77     Resp 01/31/20 1158 16     Temp 01/31/20 1158 98.2 F (36.8 C)     Temp src --      SpO2 --      Weight 01/31/20 1200 114 lb (51.7 kg)     Height 01/31/20 1200 5\' 2"  (1.575 m)     Head Circumference --      Peak Flow --      Pain Score 01/31/20 1159 0     Pain Loc --      Pain Edu? --      Excl. in [place]? --    No data found.  Updated Vital Signs BP (!) 162/92   Pulse 77   Temp 98.2 F (36.8 C)   Resp 16   Ht 5\' 2"  (1.575 m)    Wt 114 lb (51.7 kg)   BMI 20.85 kg/m   Visual Acuity Right Eye Distance:   Left Eye Distance:   Bilateral Distance:    Right Eye Near:   Left Eye Near:    Bilateral Near:     Physical Exam Vitals and nursing note reviewed.  Constitutional:      General: She is not in acute distress.    Appearance: She is well-developed. She is not ill-appearing.  HENT:     Head: Normocephalic and atraumatic.     Mouth/Throat:     Mouth: Mucous membranes are moist.  Eyes:     Conjunctiva/sclera: Conjunctivae normal.  Cardiovascular:     Rate and Rhythm: Normal rate and regular rhythm.     Heart sounds: Normal heart sounds.  Pulmonary:     Effort: Pulmonary effort is normal. No respiratory distress.     Breath sounds: Normal breath sounds. No wheezing, rhonchi or rales.  Abdominal:     Palpations: Abdomen is soft.     Tenderness: There is no abdominal tenderness. There is no guarding or rebound.  Musculoskeletal:     Cervical back: Neck supple.     Right lower leg: No edema.     Left lower leg: No edema.  Skin:    General: Skin is warm and dry.     Findings: No rash.  Neurological:     General: No focal deficit present.     Mental Status: She is alert and oriented to person, place, and time.     Cranial Nerves: No cranial nerve deficit.     Sensory: No sensory deficit.     Motor: No weakness.     Coordination: Coordination normal.     Gait: Gait normal.  Psychiatric:        Mood and Affect: Mood normal.        Behavior: Behavior normal.      UC Treatments / Results  Labs (all labs ordered are listed, but only abnormal results are displayed) Labs Reviewed  CBC  COMPREHENSIVE METABOLIC PANEL    EKG   Radiology No results found.  Procedures Procedures (including critical care time)  Medications Ordered in UC Medications - No data to display  Initial Impression / Assessment and Plan / UC Course  I have reviewed the triage vital signs and the nursing  notes.  Pertinent labs & imaging results that were available during my care of the patient were reviewed by me and considered in my medical decision making (see chart for details).   Elevated blood pressure reading, abnormal EKG, chest discomfort.  EKG shows sinus rhythm, rate 65,  slight ST depression, compared to previous from 2012. ED physician [facility] notified.  Patient declines EMS and states she feels safe to drive herself.   Final Clinical Impressions(s) / UC Diagnoses   Final diagnoses:  Elevated blood pressure reading  Abnormal EKG  Chest pain, unspecified type     Discharge Instructions     Go to the Emergency Department for evaluation of your chest discomfort, headache, elevated blood pressure, and EKG abnormality.       ED Prescriptions    None     PDMP not reviewed this encounter.   Sharion Balloon, NP 01/31/20 [phone]

## 2020-02-01 ENCOUNTER — Other Ambulatory Visit: Payer: Self-pay

## 2020-02-01 ENCOUNTER — Observation Stay
Admit: 2020-02-01 | Discharge: 2020-02-01 | Disposition: A | Discharge status: Home / Self Care | Payer: 59 | Attending: Internal Medicine | Admitting: Internal Medicine

## 2020-02-01 DIAGNOSIS — K219 Gastro-esophageal reflux disease without esophagitis: Secondary | ICD-10-CM | POA: Diagnosis not present

## 2020-02-01 DIAGNOSIS — R778 Other specified abnormalities of plasma proteins: Secondary | ICD-10-CM | POA: Diagnosis not present

## 2020-02-01 DIAGNOSIS — R0789 Other chest pain: Secondary | ICD-10-CM | POA: Diagnosis not present

## 2020-02-01 DIAGNOSIS — R7989 Other specified abnormal findings of blood chemistry: Secondary | ICD-10-CM | POA: Diagnosis present

## 2020-02-01 DIAGNOSIS — R030 Elevated blood-pressure reading, without diagnosis of hypertension: Secondary | ICD-10-CM | POA: Diagnosis not present

## 2020-02-01 LAB — COMPREHENSIVE METABOLIC PANEL
ALT: 12 IU/L (ref 0–32)
AST: 21 IU/L (ref 0–40)
Albumin/Globulin Ratio: 1.7 (ref 1.2–2.2)
Albumin: 4.8 g/dL (ref 3.8–4.8)
Alkaline Phosphatase: 65 IU/L (ref 44–121)
BUN/Creatinine Ratio: 11 — ABNORMAL LOW (ref 12–28)
BUN: 10 mg/dL (ref 8–27)
Bilirubin Total: 0.6 mg/dL (ref 0.0–1.2)
CO2: 26 mmol/L (ref 20–29)
Calcium: 10 mg/dL (ref 8.7–10.3)
Chloride: 102 mmol/L (ref 96–106)
Creatinine, Ser: 0.9 mg/dL (ref 0.57–1.00)
GFR calc Af Amer: 79 mL/min/{1.73_m2} (ref >59)
GFR calc non Af Amer: 69 mL/min/{1.73_m2} (ref >59)
Globulin, Total: 2.8 g/dL (ref 1.5–4.5)
Glucose: 110 mg/dL — ABNORMAL HIGH (ref 65–99)
Potassium: 5 mmol/L (ref 3.5–5.2)
Sodium: 140 mmol/L (ref 134–144)
Total Protein: 7.6 g/dL (ref 6.0–8.5)

## 2020-02-01 LAB — TROPONIN I (HIGH SENSITIVITY)
Troponin I (High Sensitivity): 54 ng/L — ABNORMAL HIGH (ref <18)
Troponin I (High Sensitivity): 105 ng/L (ref <18)
Troponin I (High Sensitivity): 118 ng/L

## 2020-02-01 LAB — CBC
HCT: 38.7 % (ref 36.0–46.0)
Hematocrit: 42 % (ref 34.0–46.6)
Hemoglobin: 14.1 g/dL (ref 11.1–15.9)
Hemoglobin: 12.7 g/dL (ref 12.0–15.0)
MCH: 30.9 pg (ref 26.6–33.0)
MCH: 30.8 pg (ref 26.0–34.0)
MCHC: 33.6 g/dL (ref 31.5–35.7)
MCHC: 32.8 g/dL (ref 30.0–36.0)
MCV: 92 fL (ref 79–97)
MCV: 93.9 fL (ref 80.0–100.0)
Platelets: 287 10*3/uL (ref 150–450)
Platelets: 240 10*3/uL (ref 150–400)
RBC: 4.57 x10E6/uL (ref 3.77–5.28)
RBC: 4.12 MIL/uL (ref 3.87–5.11)
RDW: 12.5 % (ref 11.7–15.4)
RDW: 13.1 % (ref 11.5–15.5)
WBC: 4.7 10*3/uL (ref 3.4–10.8)
WBC: 4.8 10*3/uL (ref 4.0–10.5)
nRBC: 0 % (ref 0.0–0.2)

## 2020-02-01 LAB — BASIC METABOLIC PANEL WITH GFR
Anion gap: 8 (ref 5–15)
BUN: 9 mg/dL (ref 8–23)
CO2: 25 mmol/L (ref 22–32)
Calcium: 9.1 mg/dL (ref 8.9–10.3)
Chloride: 107 mmol/L (ref 98–111)
Creatinine, Ser: 0.91 mg/dL (ref 0.44–1.00)
GFR, Estimated: >60 mL/min
Glucose, Bld: 95 mg/dL (ref 70–99)
Potassium: 3.7 mmol/L (ref 3.5–5.1)
Sodium: 140 mmol/L (ref 135–145)

## 2020-02-01 LAB — ECHOCARDIOGRAM COMPLETE
Height: 62 in
S' Lateral: 2.09 cm
Weight: 1823.65 [oz_av]

## 2020-02-01 LAB — MAGNESIUM: Magnesium: 2.3 mg/dL (ref 1.7–2.4)

## 2020-02-01 LAB — FIBRIN DERIVATIVES D-DIMER (ARMC ONLY): Fibrin derivatives D-dimer (ARMC): 284.14 ng/mL (FEU) (ref 0.00–499.00)

## 2020-02-01 LAB — TSH: TSH: 2.797 u[IU]/mL (ref 0.350–4.500)

## 2020-02-01 LAB — HIV ANTIBODY (ROUTINE TESTING W REFLEX): HIV Screen 4th Generation wRfx: NONREACTIVE

## 2020-02-01 MED ORDER — PANTOPRAZOLE SODIUM 40 MG PO TBEC
40.0000 mg | DELAYED_RELEASE_TABLET | Freq: Every day | ORAL | Status: DC
Start: 2020-02-01 — End: 2020-02-01

## 2020-02-01 MED ORDER — METOPROLOL SUCCINATE ER 25 MG PO TB24
12.5000 mg | ORAL_TABLET | Freq: Every day | ORAL | Status: DC
  Administered 2020-02-01 – 2020-02-02 (×2): 12.5 mg via ORAL
  Filled 2020-02-01 (×2): qty 1
  Filled 2020-02-01: qty 0.5

## 2020-02-01 NOTE — ED Notes (Signed)
Pt awake and ambulatory to bedside toilet independently and without difficulty.

## 2020-02-01 NOTE — ED Notes (Signed)
RN to bedside to assist pt.

## 2020-02-01 NOTE — Consult Note (Signed)
CARDIOLOGY CONSULT NOTE               Patient ID: Angel Taylor MRN: [id number] DOB/AGE: 09/19/1955 64 y.o.  Admit date: 01/31/2020 Referring Physician Dr. Nolberto Hanlon hospitalist Primary Physician Einar Pheasant Primary Cardiologist: [facility]  Reason for Consultation chest pain and possible angina  HPI: Patient presented with chest pain symptoms fluctuating high blood pressure denies any previous cardiac history but complains of midsternal low-level chest discomfort she came to emergency room EKG had subtle nonspecific ST depression inferiorly no old EKG to compare patient was pain-free during the interview pain was not related to activity or exertion she does not smoke or drink no previous cardiac history reportedly had a stress test done years ago which was reportedly unremarkable patient now here with chest pain symptoms.  Patient was referred after being seen in urgent care clinic  Review of systems complete and found to be negative unless listed above     Past Medical History:  Diagnosis Date  . Anemia   . Chronic headaches   . Frequent UTI   . GERD (gastroesophageal reflux disease)   . Pre-eclampsia     Past Surgical History:  Procedure Laterality Date  . ABDOMINAL HYSTERECTOMY     total  . CESAREAN SECTION  1987  . TUBAL LIGATION  1987    (Not in a hospital admission)  Social History   Socioeconomic History  . Marital status: Married    Spouse name: Not on file  . Number of children: 2  . Years of education: Not on file  . Highest education level: Not on file  Occupational History  . Not on file  Tobacco Use  . Smoking status: Never Smoker  . Smokeless tobacco: Never Used  Substance and Sexual Activity  . Alcohol use: No    Alcohol/week: 0.0 standard drinks  . Drug use: No  . Sexual activity: Not on file  Other Topics Concern  . Not on file  Social History Narrative  . Not on file   Social Determinants of Health   Financial Resource  Strain:   . Difficulty of Paying Living Expenses: Not on file  Food Insecurity:   . Worried About Charity fundraiser in the Last Year: Not on file  . Ran Out of Food in the Last Year: Not on file  Transportation Needs:   . Lack of Transportation (Medical): Not on file  . Lack of Transportation (Non-Medical): Not on file  Physical Activity:   . Days of Exercise per Week: Not on file  . Minutes of Exercise per Session: Not on file  Stress:   . Feeling of Stress : Not on file  Social Connections:   . Frequency of Communication with Friends and Family: Not on file  . Frequency of Social Gatherings with Friends and Family: Not on file  . Attends Religious Services: Not on file  . Active Member of Clubs or Organizations: Not on file  . Attends Archivist Meetings: Not on file  . Marital Status: Not on file  Intimate Partner Violence:   . Fear of Current or Ex-Partner: Not on file  . Emotionally Abused: Not on file  . Physically Abused: Not on file  . Sexually Abused: Not on file    Family History  Problem Relation Age of Onset  . Diabetes Mother   . Alzheimer's disease Mother   . Seizures Mother   . Heart disease Father   . Breast cancer  Neg Hx       Review of systems complete and found to be negative unless listed above      PHYSICAL EXAM  General: Well developed, well nourished, in no acute distress HEENT:  Normocephalic and atramatic Neck:  No JVD.  Lungs: Clear bilaterally to auscultation and percussion. Heart: HRRR . Normal S1 and S2 without gallops or murmurs.  Abdomen: Bowel sounds are positive, abdomen soft and non-tender  Msk:  Back normal, normal gait. Normal strength and tone for age. Extremities: No clubbing, cyanosis or edema.   Neuro: Alert and oriented X 3. Psych:  Good affect, responds appropriately  Labs:   Lab Results  Component Value Date   WBC 4.8 02/01/2020   HGB 12.7 02/01/2020   HCT 38.7 02/01/2020   MCV 93.9 02/01/2020   PLT  240 02/01/2020    Recent Labs  Lab 01/31/20 1315 01/31/20 1315 02/01/20 0513  NA 137   < > 140  K 3.8   < > 3.7  CL 101   < > 107  CO2 25   < > 25  BUN 11   < > 9  CREATININE 0.82   < > 0.91  CALCIUM 9.7   < > 9.1  PROT 8.0  --   --   BILITOT 1.0  --   --   ALKPHOS 56  --   --   ALT 15  --   --   AST 25  --   --   GLUCOSE 129*   < > 95   < > = values in this interval not displayed.   No results found for: CKTOTAL, CKMB, CKMBINDEX, TROPONINI  Lab Results  Component Value Date   CHOL 210 (H) 11/20/2018   CHOL 228 (H) 03/12/2016   CHOL 224 (H) 07/03/2014   Lab Results  Component Value Date   HDL 76.00 11/20/2018   HDL 79.90 03/12/2016   HDL 79.00 07/03/2014   Lab Results  Component Value Date   LDLCALC 120 (H) 11/20/2018   LDLCALC 138 (H) 03/12/2016   LDLCALC 132 (H) 07/03/2014   Lab Results  Component Value Date   TRIG 70.0 11/20/2018   TRIG 53.0 03/12/2016   TRIG 67.0 07/03/2014   Lab Results  Component Value Date   CHOLHDL 3 11/20/2018   CHOLHDL 3 03/12/2016   CHOLHDL 3 07/03/2014   Lab Results  Component Value Date   LDLDIRECT 119.7 05/30/2012      Radiology: [facility] Chest Port 1 View  Result Date: 01/31/2020 CLINICAL DATA:  Chest pain. EXAM: PORTABLE CHEST 1 VIEW COMPARISON:  April 27, 2013. FINDINGS: The heart size and mediastinal contours are within normal limits. Both lungs are clear. No pneumothorax or pleural effusion is noted. The visualized skeletal structures are unremarkable. IMPRESSION: No active disease. Electronically Signed   By: Marijo Conception M.D.   On: 01/31/2020 15:04   ECHOCARDIOGRAM COMPLETE  Result Date: 02/01/2020    ECHOCARDIOGRAM REPORT   Patient Name:   Angel Taylor Date of Exam: 02/01/2020 Medical Rec #:  [id number]     Height:       62.0 in Accession #:    [id number]    Weight:       114.0 lb Date of Birth:  Jun 30, 1955     BSA:          1.505 m Patient Age:    33 years      BP:  111/78 mmHg Patient Gender: F              HR:           77 bpm. Exam Location:  [facility] Procedure: 2D Echo, Cardiac Doppler and Color Doppler Indications:     Chest pain 786.50  History:         Patient has no prior history of Echocardiogram examinations.                  Frequent chronic headaches.  Sonographer:     Sherrie Sport RDCS (AE) Referring Phys:  [id number] Rhetta Mura Diagnosing Phys: Yolonda Kida MD  Sonographer Comments: No apical window. IMPRESSIONS  1. TDS.  2. Left ventricular ejection fraction, by estimation, is 65 to 70%. The left ventricle has normal function. The left ventricle has no regional wall motion abnormalities. Left ventricular diastolic parameters were normal.  3. Right ventricular systolic function is normal. The right ventricular size is normal.  4. The mitral valve is normal in structure. No evidence of mitral valve regurgitation.  5. The aortic valve is normal in structure. Aortic valve regurgitation is not visualized. Conclusion(s)/Recommendation(s): Poor windows for evaluation of left ventricular function by transthoracic echocardiography. Would recommend an alternative means of evaluation. FINDINGS  Left Ventricle: Left ventricular ejection fraction, by estimation, is 65 to 70%. The left ventricle has normal function. The left ventricle has no regional wall motion abnormalities. The left ventricular internal cavity size was normal in size. There is  no left ventricular hypertrophy. Left ventricular diastolic parameters were normal. Right Ventricle: The right ventricular size is normal. No increase in right ventricular wall thickness. Right ventricular systolic function is normal. Left Atrium: Left atrial size was normal in size. Right Atrium: Right atrial size was normal in size. Pericardium: There is no evidence of pericardial effusion. Mitral Valve: The mitral valve is normal in structure. No evidence of mitral valve regurgitation. Tricuspid Valve: The tricuspid valve is normal in structure. Tricuspid valve  regurgitation is not demonstrated. Aortic Valve: The aortic valve is normal in structure. Aortic valve regurgitation is not visualized. Pulmonic Valve: The pulmonic valve was normal in structure. Pulmonic valve regurgitation is not visualized. Aorta: The ascending aorta was not well visualized. IAS/Shunts: No atrial level shunt detected by color flow Doppler. Additional Comments: TDS.  LEFT VENTRICLE PLAX 2D LVIDd:         3.62 cm LVIDs:         2.09 cm LV PW:         0.87 cm LV IVS:        0.94 cm LVOT diam:     2.00 cm LVOT Area:     3.14 cm  LEFT ATRIUM         Index LA diam:    2.10 cm 1.40 cm/m                        PULMONIC VALVE AORTA                 PV Vmax:        0.78 m/s Ao Root diam: 2.50 cm PV Peak grad:   2.4 mmHg                       RVOT Peak grad: 3 mmHg   SHUNTS Systemic Diam: 2.00 cm Yolonda Kida MD Electronically signed by Yolonda Kida MD Signature Date/Time: 02/01/2020/9:59:57 AM  Final     EKG: Normal sinus rhythm with subtle diffuse inferior depression inferiorly  ASSESSMENT AND PLAN:  Chest pain possible angina GERD Abnormal EKG Intermittent hypotension Hyperlipidemia . Plan Agree with admit to rule out myocardial infarction Follow-up troponins and EKGs Recommend medical therapy for now with aspirin 81 mg a day Would also start Imdur 30 mg a day with metoprolol succinate 25 mg daily Echocardiogram was reasonably unremarkable Consider functional study which can be done as an outpatient once the patient was started on medical therapy Recommend reflux therapy with omeprazole or Protonix  Signed: Yolonda Kida MD 02/01/2020, 10:49 AM

## 2020-02-01 NOTE — ED Notes (Signed)
Pt on personal phone with someone. Bed locked low. Rail up. Call bell within reach.

## 2020-02-01 NOTE — Progress Notes (Signed)
*  PRELIMINARY RESULTS* Echocardiogram 2D Echocardiogram has been performed.  Angel Taylor 02/01/2020, 9:31 AM

## 2020-02-01 NOTE — Progress Notes (Signed)
PROGRESS NOTE    Angel Taylor  YOV:[id number] DOB: 09/02/1955 DOA: 01/31/2020 PCP: Einar Pheasant, MD    Brief Narrative:  Angel Taylor is a 64 y.o. female with medical history significant for GERD, recurrent urinary tract infections, who is admitted to [facility] on 01/31/2020 for further evaluation and management of chest pain after presenting to [facility] emergency c/o chest discomfort.   10/29-troponin trending down.     Consultants:     Procedures:   Antimicrobials:       Subjective: Cp resolved currently. She thinks she is under a lot of stress at work causing her bp to rise. No sob  Objective: Vitals:   02/01/20 1500 02/01/20 1530 02/01/20 1600 02/01/20 1607  BP: 127/85   (!) 158/97  Pulse: 64 62 74 88  Resp: 18 15 13    Temp:      TempSrc:      SpO2: 100% 100% 100% 100%  Weight:      Height:       No intake or output data in the 24 hours ending 02/01/20 1722 Filed Weights   01/31/20 1256  Weight: 51.7 kg    Examination:  General exam: Appears calm and comfortable  Respiratory system: Clear to auscultation. Respiratory effort normal. Cardiovascular system: S1 & S2 heard, RRR. No JVD, murmurs, rubs, gallops or clicks. No pedal edema. Gastrointestinal system: Abdomen is nondistended, soft and nontender. No organomegaly or masses felt. Normal bowel sounds heard. Central nervous system: Alert and oriented. No focal neurological deficits. Extremities: No edema Skin: Warm dry Psychiatry: Judgement and insight appear normal. Mood & affect appropriate.     Data Reviewed: I have personally reviewed following labs and imaging studies  CBC: Recent Labs  Lab 01/31/20 1234 01/31/20 1315 02/01/20 0513  WBC 4.7 4.9 4.8  HGB 14.1 13.7 12.7  HCT 42.0 42.4 38.7  MCV 92 94.6 93.9  PLT 287 289 741   Basic Metabolic Panel: Recent Labs  Lab 01/31/20 1234 01/31/20 1315 02/01/20 0513  NA 140 137 140  K 5.0  3.8 3.7  CL 102 101 107  CO2 26 25 25   GLUCOSE 110* 129* 95  BUN 10 11 9   CREATININE 0.90 0.82 0.91  CALCIUM 10.0 9.7 9.1  MG  --  2.2 2.3   GFR: Estimated Creatinine Clearance: 50.7 mL/min (by C-G formula based on SCr of 0.91 mg/dL). Liver Function Tests: Recent Labs  Lab 01/31/20 1234 01/31/20 1315  AST 21 25  ALT 12 15  ALKPHOS 65 56  BILITOT 0.6 1.0  PROT 7.6 8.0  ALBUMIN 4.8 4.6   No results for input(s): LIPASE, AMYLASE in the last 168 hours. No results for input(s): AMMONIA in the last 168 hours. Coagulation Profile: No results for input(s): INR, PROTIME in the last 168 hours. Cardiac Enzymes: No results for input(s): CKTOTAL, CKMB, CKMBINDEX, TROPONINI in the last 168 hours. BNP (last 3 results) No results for input(s): PROBNP in the last 8760 hours. HbA1C: No results for input(s): HGBA1C in the last 72 hours. CBG: No results for input(s): GLUCAP in the last 168 hours. Lipid Profile: No results for input(s): CHOL, HDL, LDLCALC, TRIG, CHOLHDL, LDLDIRECT in the last 72 hours. Thyroid Function Tests: Recent Labs    02/01/20 0513  TSH 2.797   Anemia Panel: No results for input(s): VITAMINB12, FOLATE, FERRITIN, TIBC, IRON, RETICCTPCT in the last 72 hours. Sepsis Labs: No results for input(s): PROCALCITON, LATICACIDVEN in the last 168 hours.  Recent Results (from the past 240 hour(s))  Respiratory Panel by RT PCR (Flu A&B, Covid) - Nasopharyngeal Swab     Status: None   Collection Time: 01/31/20  8:42 PM   Specimen: Nasopharyngeal Swab  Result Value Ref Range Status   SARS Coronavirus 2 by RT PCR NEGATIVE NEGATIVE Final    Comment: (NOTE) SARS-CoV-2 target nucleic acids are NOT DETECTED.  The SARS-CoV-2 RNA is generally detectable in upper respiratoy specimens during the acute phase of infection. The lowest concentration of SARS-CoV-2 viral copies this assay can detect is 131 copies/mL. A negative result does not preclude SARS-Cov-2 infection and should  not be used as the sole basis for treatment or other patient management decisions. A negative result may occur with  improper specimen collection/handling, submission of specimen other than nasopharyngeal swab, presence of viral mutation(s) within the areas targeted by this assay, and inadequate number of viral copies (<131 copies/mL). A negative result must be combined with clinical observations, patient history, and epidemiological information. The expected result is Negative.  Fact Sheet for Patients:  PinkCheek.be  Fact Sheet for Healthcare Providers:  GravelBags.it  This test is no t yet approved or cleared by the [place] FDA and  has been authorized for detection and/or diagnosis of SARS-CoV-2 by FDA under an Emergency Use Authorization (EUA). This EUA will remain  in effect (meaning this test can be used) for the duration of the COVID-19 declaration under Section 564(b)(1) of the Act, 21 U.S.C. section 360bbb-3(b)(1), unless the authorization is terminated or revoked sooner.     Influenza A by PCR NEGATIVE NEGATIVE Final   Influenza B by PCR NEGATIVE NEGATIVE Final    Comment: (NOTE) The Xpert Xpress SARS-CoV-2/FLU/RSV assay is intended as an aid in  the diagnosis of influenza from Nasopharyngeal swab specimens and  should not be used as a sole basis for treatment. Nasal washings and  aspirates are unacceptable for Xpert Xpress SARS-CoV-2/FLU/RSV  testing.  Fact Sheet for Patients: PinkCheek.be  Fact Sheet for Healthcare Providers: GravelBags.it  This test is not yet approved or cleared by the [place] FDA and  has been authorized for detection and/or diagnosis of SARS-CoV-2 by  FDA under an Emergency Use Authorization (EUA). This EUA will remain  in effect (meaning this test can be used) for the duration of the  Covid-19 declaration under  Section 564(b)(1) of the Act, 21  U.S.C. section 360bbb-3(b)(1), unless the authorization is  terminated or revoked. Performed at [facility], [address] [place], [place] [address]          Radiology Studies: [facility] Chest [place] 1 View  Result Date: 01/31/2020 CLINICAL DATA:  Chest pain. EXAM: PORTABLE CHEST 1 VIEW COMPARISON:  April 27, 2013. FINDINGS: The heart size and mediastinal contours are within normal limits. Both lungs are clear. No pneumothorax or pleural effusion is noted. The visualized skeletal structures are unremarkable. IMPRESSION: No active disease. Electronically Signed   By: Marijo Conception M.D.   On: 01/31/2020 15:04   ECHOCARDIOGRAM COMPLETE  Result Date: 02/01/2020    ECHOCARDIOGRAM REPORT   Patient Name:   RANELLE AUKER Date of Exam: 02/01/2020 Medical Rec #:  [id number]     Height:       62.0 in Accession #:    [id number]    Weight:       114.0 lb Date of Birth:  [birth year]     BSA:          1.505 m  Patient Age:    14 years      BP:           111/78 mmHg Patient Gender: F             HR:           77 bpm. Exam Location:  [facility] Procedure: 2D Echo, Cardiac Doppler and Color Doppler Indications:     Chest pain 786.50  History:         Patient has no prior history of Echocardiogram examinations.                  Frequent chronic headaches.  Sonographer:     Sherrie Sport RDCS (AE) Referring Phys:  [id number] Rhetta Mura Diagnosing Phys: Yolonda Kida MD  Sonographer Comments: No apical window. IMPRESSIONS  1. TDS.  2. Left ventricular ejection fraction, by estimation, is 65 to 70%. The left ventricle has normal function. The left ventricle has no regional wall motion abnormalities. Left ventricular diastolic parameters were normal.  3. Right ventricular systolic function is normal. The right ventricular size is normal.  4. The mitral valve is normal in structure. No evidence of mitral valve regurgitation.  5. The aortic valve is normal in structure. Aortic  valve regurgitation is not visualized. Conclusion(s)/Recommendation(s): Poor windows for evaluation of left ventricular function by transthoracic echocardiography. Would recommend an alternative means of evaluation. FINDINGS  Left Ventricle: Left ventricular ejection fraction, by estimation, is 65 to 70%. The left ventricle has normal function. The left ventricle has no regional wall motion abnormalities. The left ventricular internal cavity size was normal in size. There is  no left ventricular hypertrophy. Left ventricular diastolic parameters were normal. Right Ventricle: The right ventricular size is normal. No increase in right ventricular wall thickness. Right ventricular systolic function is normal. Left Atrium: Left atrial size was normal in size. Right Atrium: Right atrial size was normal in size. Pericardium: There is no evidence of pericardial effusion. Mitral Valve: The mitral valve is normal in structure. No evidence of mitral valve regurgitation. Tricuspid Valve: The tricuspid valve is normal in structure. Tricuspid valve regurgitation is not demonstrated. Aortic Valve: The aortic valve is normal in structure. Aortic valve regurgitation is not visualized. Pulmonic Valve: The pulmonic valve was normal in structure. Pulmonic valve regurgitation is not visualized. Aorta: The ascending aorta was not well visualized. IAS/Shunts: No atrial level shunt detected by color flow Doppler. Additional Comments: TDS.  LEFT VENTRICLE PLAX 2D LVIDd:         3.62 cm LVIDs:         2.09 cm LV PW:         0.87 cm LV IVS:        0.94 cm LVOT diam:     2.00 cm LVOT Area:     3.14 cm  LEFT ATRIUM         Index LA diam:    2.10 cm 1.40 cm/m                        PULMONIC VALVE AORTA                 PV Vmax:        0.78 m/s Ao Root diam: 2.50 cm PV Peak grad:   2.4 mmHg                       RVOT Peak grad: 3 mmHg  SHUNTS Systemic Diam: 2.00 cm Yolonda Kida MD Electronically signed by Yolonda Kida MD Signature  Date/Time: 02/01/2020/9:59:57 AM    Final         Scheduled Meds: . enoxaparin (LOVENOX) injection  40 mg Subcutaneous Q24H  . famotidine  20 mg Oral Daily  . latanoprost  1 drop Left Eye QHS  . metoprolol succinate  12.5 mg Oral Daily  . timolol  1 drop Left Eye Daily   Continuous Infusions:  Assessment & Plan:   Active Problems:   GERD (gastroesophageal reflux disease)   Elevated blood pressure reading   Atypical chest pain   Elevated troponin   #) Atypical chest pain:with elevated troponins, and slight EKG changes. troponins trending down Cards consulted input was appreciated Start asa  81mg  qd Started on famotidine for possible GERD Echo with nml EF.  Without any wall motion abnormalities We will start Toprol-XL 12.5 daily If BP allows after initiation of Toprol we will add Imdur Will need stress testing as outpatient with follow-up with cardiology   #Glaucoma-  On timolol and Xalatan    #)?HTN- pt reports when she is stress notices rise in bp only. Here has been elevated at times.  Will continue monitor especially with initiation of beta blk low dose Ativan prn for anxiety     DVT prophylaxis: Lovenox Code Status: Full Family Communication: None at bedside  Status is: Observation  The patient remains OBS appropriate and will d/c before 2 midnights.  Dispo: The patient is from: Home              Anticipated d/c is to: Home              Anticipated d/c date is: 1 day              Patient currently is not medically stable to d/c. Need to start medical management to make sure patient is stable            LOS: 0 days   Time spent: 35 min with >50% on coc   Nolberto Hanlon, MD [facility] Pager [phone]  If 7PM-7AM, please contact night-coverage [email or web address] Password [email or web address] 02/01/2020, 5:22 PM

## 2020-02-01 NOTE — Progress Notes (Signed)
Report called to [place] on 2A

## 2020-02-01 NOTE — ED Notes (Signed)
Visitor remains with pt.

## 2020-02-01 NOTE — ED Notes (Signed)
EVS staff at bedside. Pt alert and sitting calmly in bed. Bed locked low. Rail up. Call bell within reach.

## 2020-02-01 NOTE — ED Notes (Signed)
Pt resting in bed in NAD. Lights dimmed and bed locked in lowest position. Call bell in reach.

## 2020-02-02 ENCOUNTER — Observation Stay: Payer: 59

## 2020-02-02 DIAGNOSIS — K219 Gastro-esophageal reflux disease without esophagitis: Secondary | ICD-10-CM | POA: Diagnosis not present

## 2020-02-02 DIAGNOSIS — R0789 Other chest pain: Secondary | ICD-10-CM | POA: Diagnosis not present

## 2020-02-02 DIAGNOSIS — R030 Elevated blood-pressure reading, without diagnosis of hypertension: Secondary | ICD-10-CM | POA: Diagnosis not present

## 2020-02-02 LAB — NM MYOCAR MULTI W/SPECT W/WALL MOTION / EF
Estimated workload: 5.8 METS
Exercise duration (min): 4 min
Exercise duration (sec): 1 s
LV dias vol: 16 mL (ref 46–106)
LV sys vol: 1 mL
MPHR: 158 {beats}/min
Peak HR: 153 {beats}/min
Percent HR: 96 %
Rest HR: 81 {beats}/min
SDS: 1
SRS: 2
SSS: 1
TID: 0.81

## 2020-02-02 MED ORDER — FAMOTIDINE 20 MG PO TABS: 20.0000 mg | ORAL_TABLET | Freq: Every day | ORAL | 0 refills | Status: DC

## 2020-02-02 MED ORDER — TECHNETIUM TC 99M TETROFOSMIN IV KIT: 30.0000 | PACK | Freq: Once | INTRAVENOUS | Status: DC | PRN

## 2020-02-02 MED ORDER — TECHNETIUM TC 99M TETROFOSMIN IV KIT
10.0000 | PACK | Freq: Once | INTRAVENOUS | Status: AC | PRN
  Administered 2020-02-02: 10.8 via INTRAVENOUS

## 2020-02-02 MED ORDER — ASPIRIN EC 81 MG PO TBEC
81.0000 mg | DELAYED_RELEASE_TABLET | Freq: Every day | ORAL | Status: DC
  Administered 2020-02-02: 81 mg via ORAL
  Filled 2020-02-02: qty 1

## 2020-02-02 MED ORDER — TECHNETIUM TC 99M TETROFOSMIN IV KIT
30.0000 | PACK | Freq: Once | INTRAVENOUS | Status: AC | PRN
  Administered 2020-02-02: 32.094 via INTRAVENOUS

## 2020-02-02 MED ORDER — METOPROLOL SUCCINATE ER 25 MG PO TB24: 12.5000 mg | ORAL_TABLET | Freq: Every day | ORAL | 0 refills | Status: DC

## 2020-02-02 MED ORDER — ASPIRIN 81 MG PO TBEC
81.0000 mg | DELAYED_RELEASE_TABLET | Freq: Every day | ORAL | 11 refills | Status: DC
Start: 2020-02-03 — End: 2022-02-17

## 2020-02-02 NOTE — Progress Notes (Signed)
[facility] Cardiology    SUBJECTIVE: Denies any chest pain no shortness of breath feels reasonably well should be stable for discharge.     Vitals:   02/01/20 1800 02/01/20 1834 02/02/20 0327 02/02/20 0814  BP: (!) 139/97 (!) 153/89 (!) 143/83 (!) 144/88  Pulse: 69 72 68 83  Resp:  18 16 19   Temp:  99.5 F (37.5 C) 97.7 F (36.5 C) 98.4 F (36.9 C)  TempSrc:  Oral Oral Oral  SpO2: 99% 100% 98% 100%  Weight:  49.1 kg    Height:  5\' 2"  (1.575 m)       Intake/Output Summary (Last 24 hours) at 02/02/2020 1018 Last data filed at 02/02/2020 0400 Gross per 24 hour  Intake 440 ml  Output --  Net 440 ml      PHYSICAL EXAM  General: Well developed, well nourished, in no acute distress HEENT:  Normocephalic and atramatic Neck:  No JVD.  Lungs: Clear bilaterally to auscultation and percussion. Heart: HRRR . Normal S1 and S2 without gallops or murmurs.  Abdomen: Bowel sounds are positive, abdomen soft and non-tender  Msk:  Back normal, normal gait. Normal strength and tone for age. Extremities: No clubbing, cyanosis or edema.   Neuro: Alert and oriented X 3. Psych:  Good affect, responds appropriately   LABS: Basic Metabolic Panel: Recent Labs    01/31/20 1315 02/01/20 0513  NA 137 140  K 3.8 3.7  CL 101 107  CO2 25 25  GLUCOSE 129* 95  BUN 11 9  CREATININE 0.82 0.91  CALCIUM 9.7 9.1  MG 2.2 2.3   Liver Function Tests: Recent Labs    01/31/20 1234 01/31/20 1315  AST 21 25  ALT 12 15  ALKPHOS 65 56  BILITOT 0.6 1.0  PROT 7.6 8.0  ALBUMIN 4.8 4.6   No results for input(s): LIPASE, AMYLASE in the last 72 hours. CBC: Recent Labs    01/31/20 1315 02/01/20 0513  WBC 4.9 4.8  HGB 13.7 12.7  HCT 42.4 38.7  MCV 94.6 93.9  PLT 289 240   Cardiac Enzymes: No results for input(s): CKTOTAL, CKMB, CKMBINDEX, TROPONINI in the last 72 hours. BNP: Invalid input(s): POCBNP D-Dimer: No results for input(s): DDIMER in the last 72 hours. Hemoglobin A1C: No results  for input(s): HGBA1C in the last 72 hours. Fasting Lipid Panel: No results for input(s): CHOL, HDL, LDLCALC, TRIG, CHOLHDL, LDLDIRECT in the last 72 hours. Thyroid Function Tests: Recent Labs    02/01/20 0513  TSH 2.797   Anemia Panel: No results for input(s): VITAMINB12, FOLATE, FERRITIN, TIBC, IRON, RETICCTPCT in the last 72 hours.  DG Chest Port 1 View  Result Date: 01/31/2020 CLINICAL DATA:  Chest pain. EXAM: PORTABLE CHEST 1 VIEW COMPARISON:  April 27, 2013. FINDINGS: The heart size and mediastinal contours are within normal limits. Both lungs are clear. No pneumothorax or pleural effusion is noted. The visualized skeletal structures are unremarkable. IMPRESSION: No active disease. Electronically Signed   By: Marijo Conception M.D.   On: 01/31/2020 15:04   ECHOCARDIOGRAM COMPLETE  Result Date: 02/01/2020    ECHOCARDIOGRAM REPORT   Patient Name:   Angel Taylor Date of Exam: 02/01/2020 Medical Rec #:  [id number]     Height:       62.0 in Accession #:    [id number]    Weight:       114.0 lb Date of Birth:  Feb 08, 1956     BSA:  1.505 m Patient Age:    64 years      BP:           111/78 mmHg Patient Gender: F             HR:           77 bpm. Exam Location:  [facility] Procedure: 2D Echo, Cardiac Doppler and Color Doppler Indications:     Chest pain 786.50  History:         Patient has no prior history of Echocardiogram examinations.                  Frequent chronic headaches.  Sonographer:     Sherrie Sport RDCS (AE) Referring Phys:  [id number] Rhetta Mura Diagnosing Phys: Yolonda Kida MD  Sonographer Comments: No apical window. IMPRESSIONS  1. TDS.  2. Left ventricular ejection fraction, by estimation, is 65 to 70%. The left ventricle has normal function. The left ventricle has no regional wall motion abnormalities. Left ventricular diastolic parameters were normal.  3. Right ventricular systolic function is normal. The right ventricular size is normal.  4. The mitral valve is normal in  structure. No evidence of mitral valve regurgitation.  5. The aortic valve is normal in structure. Aortic valve regurgitation is not visualized. Conclusion(s)/Recommendation(s): Poor windows for evaluation of left ventricular function by transthoracic echocardiography. Would recommend an alternative means of evaluation. FINDINGS  Left Ventricle: Left ventricular ejection fraction, by estimation, is 65 to 70%. The left ventricle has normal function. The left ventricle has no regional wall motion abnormalities. The left ventricular internal cavity size was normal in size. There is  no left ventricular hypertrophy. Left ventricular diastolic parameters were normal. Right Ventricle: The right ventricular size is normal. No increase in right ventricular wall thickness. Right ventricular systolic function is normal. Left Atrium: Left atrial size was normal in size. Right Atrium: Right atrial size was normal in size. Pericardium: There is no evidence of pericardial effusion. Mitral Valve: The mitral valve is normal in structure. No evidence of mitral valve regurgitation. Tricuspid Valve: The tricuspid valve is normal in structure. Tricuspid valve regurgitation is not demonstrated. Aortic Valve: The aortic valve is normal in structure. Aortic valve regurgitation is not visualized. Pulmonic Valve: The pulmonic valve was normal in structure. Pulmonic valve regurgitation is not visualized. Aorta: The ascending aorta was not well visualized. IAS/Shunts: No atrial level shunt detected by color flow Doppler. Additional Comments: TDS.  LEFT VENTRICLE PLAX 2D LVIDd:         3.62 cm LVIDs:         2.09 cm LV PW:         0.87 cm LV IVS:        0.94 cm LVOT diam:     2.00 cm LVOT Area:     3.14 cm  LEFT ATRIUM         Index LA diam:    2.10 cm 1.40 cm/m                        PULMONIC VALVE AORTA                 PV Vmax:        0.78 m/s Ao Root diam: 2.50 cm PV Peak grad:   2.4 mmHg                       RVOT Peak grad:  3 mmHg    SHUNTS Systemic Diam: 2.00 cm Yolonda Kida MD Electronically signed by Yolonda Kida MD Signature Date/Time: 02/01/2020/9:59:57 AM    Final      Echo preserved left ventricular function ejection fraction of 60%  TELEMETRY: Sinus rhythm nonspecific ST-T wave changes  ASSESSMENT AND PLAN:  Active Problems:   GERD (gastroesophageal reflux disease)   Elevated blood pressure reading   Atypical chest pain   Elevated troponin Status post Myoview  Plan Status post Myoview atypical chest pain which is unremarkable patient stable well enough to be discharged home with follow-up as an outpatient Recommend GI therapy for possible reflux as an etiology of her chest pain Must also consider anxiety as a other possibility Recommend conservative medical therapy Stable for discharge home after patient follow-up as necessary   Yolonda Kida, MD 02/02/2020 10:18 AM

## 2020-02-02 NOTE — Discharge Summary (Signed)
Angel Taylor MOQ:[id number] DOB: [birth year] DOA: 01/31/2020  PCP: Einar Pheasant, MD  Admit date: 01/31/2020 Discharge date: 02/02/2020  Admitted From: Home Disposition: Home  Recommendations for Outpatient Follow-up:  1. Follow up with PCP in 1 week 2. Please obtain BMP/CBC in one week 3. Follow-up cardiology in 1 week-Dr. [facility]     Discharge Condition:Stable CODE STATUS: Full Diet recommendation: Heart Healthy  Brief/Interim Summary: Angel Taylor is a 64 y.o. female with medical history significant for GERD, recurrent urinary tract infections, who is admitted to [facility] on 01/31/2020 for further evaluation and management of chest pain after presenting to [facility] emergency c/o chest discomfort.    #) Atypical chest pain:with elevated troponins, and slight EKG changes. Audiology was consulted.  She was started on 81 mg of aspirin.  Also started on famotidine for possible GERD Since her blood pressure at times were elevated she was started on Toprol-XL 12.5 mg daily per cardiology's recommendation. Echo with nml EF.  Without any wall motion abnormaon Nuclear stress was obtained which was normal study with low risk.. Patient was cleared by cardiology to be discharged home with follow-up with cardiology in 1 week   #Glaucoma-  On timolol and Xalatan    #)HTN-we will discharge on Toprol-XL.  I did tell patient to monitor her blood pressure with her home blood pressure cuff and follow-up with primary care for further assessment.  Or with cardiology   Discharge Diagnoses:  Active Problems:   GERD (gastroesophageal reflux disease)   Elevated blood pressure reading   Atypical chest pain   Elevated troponin    Discharge Instructions  Discharge Instructions    Call MD for:  difficulty breathing, headache or visual disturbances   Complete by: As directed    Diet - low sodium heart healthy   Complete by: As directed     Discharge instructions   Complete by: As directed    Follow up with Dr. Clayborn Bigness in one week F/u with pcp in one week   Increase activity slowly   Complete by: As directed      Allergies as of 02/02/2020      Reactions   Bactrim [sulfamethoxazole-trimethoprim]    Macrobid [nitrofurantoin Macrocrystal]    Sulfa Antibiotics       Medication List    TAKE these medications   aspirin 81 MG EC tablet Take 1 tablet (81 mg total) by mouth daily. Swallow whole. Start taking on: February 03, 2020   famotidine 20 MG tablet Commonly known as: PEPCID Take 1 tablet (20 mg total) by mouth daily. Start taking on: February 03, 2020   metoprolol succinate 25 MG 24 hr tablet Commonly known as: TOPROL-XL Take 0.5 tablets (12.5 mg total) by mouth daily. Start taking on: February 03, 2020   multivitamin-iron-minerals-folic acid chewable tablet Chew 1 tablet by mouth daily.   timolol 0.25 % ophthalmic solution Commonly known as: BETIMOL Place 1 drop into both eyes daily.   Travoprost (BAK Free) 0.004 % Soln ophthalmic solution Commonly known as: TRAVATAN Place 1 drop into both eyes at bedtime.       Allergies  Allergen Reactions  . Bactrim [Sulfamethoxazole-Trimethoprim]   . Macrobid [Nitrofurantoin Macrocrystal]   . Sulfa Antibiotics     Consultations:  Cardiology   Procedures/Studies: NM Myocar Multi W/Spect W/Wall Motion / EF  Result Date: 02/02/2020  Blood pressure demonstrated a normal response to exercise.  There was no ST segment deviation noted during stress.  Normal overall left ventricular function of 80%  Exercise 4 minutes  The study is normal.  This is a low risk study.  The left ventricular ejection fraction is hyperdynamic (>65%).  Nuclear stress EF: 80%.  Await stress images   DG Chest Port 1 View  Result Date: 01/31/2020 CLINICAL DATA:  Chest pain. EXAM: PORTABLE CHEST 1 VIEW COMPARISON:  April 27, 2013. FINDINGS: The heart size and mediastinal  contours are within normal limits. Both lungs are clear. No pneumothorax or pleural effusion is noted. The visualized skeletal structures are unremarkable. IMPRESSION: No active disease. Electronically Signed   By: Marijo Conception M.D.   On: 01/31/2020 15:04   ECHOCARDIOGRAM COMPLETE  Result Date: 02/01/2020    ECHOCARDIOGRAM REPORT   Patient Name:   Angel Taylor Date of Exam: 02/01/2020 Medical Rec #:  [id number]     Height:       62.0 in Accession #:    [id number]    Weight:       114.0 lb Date of Birth:  Feb 16, 1956     BSA:          1.505 m Patient Age:    63 years      BP:           111/78 mmHg Patient Gender: F             HR:           77 bpm. Exam Location:  [facility] Procedure: 2D Echo, Cardiac Doppler and Color Doppler Indications:     Chest pain 786.50  History:         Patient has no prior history of Echocardiogram examinations.                  Frequent chronic headaches.  Sonographer:     Sherrie Sport RDCS (AE) Referring Phys:  [id number] Rhetta Mura Diagnosing Phys: Yolonda Kida MD  Sonographer Comments: No apical window. IMPRESSIONS  1. TDS.  2. Left ventricular ejection fraction, by estimation, is 65 to 70%. The left ventricle has normal function. The left ventricle has no regional wall motion abnormalities. Left ventricular diastolic parameters were normal.  3. Right ventricular systolic function is normal. The right ventricular size is normal.  4. The mitral valve is normal in structure. No evidence of mitral valve regurgitation.  5. The aortic valve is normal in structure. Aortic valve regurgitation is not visualized. Conclusion(s)/Recommendation(s): Poor windows for evaluation of left ventricular function by transthoracic echocardiography. Would recommend an alternative means of evaluation. FINDINGS  Left Ventricle: Left ventricular ejection fraction, by estimation, is 65 to 70%. The left ventricle has normal function. The left ventricle has no regional wall motion abnormalities. The left  ventricular internal cavity size was normal in size. There is  no left ventricular hypertrophy. Left ventricular diastolic parameters were normal. Right Ventricle: The right ventricular size is normal. No increase in right ventricular wall thickness. Right ventricular systolic function is normal. Left Atrium: Left atrial size was normal in size. Right Atrium: Right atrial size was normal in size. Pericardium: There is no evidence of pericardial effusion. Mitral Valve: The mitral valve is normal in structure. No evidence of mitral valve regurgitation. Tricuspid Valve: The tricuspid valve is normal in structure. Tricuspid valve regurgitation is not demonstrated. Aortic Valve: The aortic valve is normal in structure. Aortic valve regurgitation is not visualized. Pulmonic Valve: The pulmonic valve was normal in structure. Pulmonic valve regurgitation is not visualized. Aorta: The  ascending aorta was not well visualized. IAS/Shunts: No atrial level shunt detected by color flow Doppler. Additional Comments: TDS.  LEFT VENTRICLE PLAX 2D LVIDd:         3.62 cm LVIDs:         2.09 cm LV PW:         0.87 cm LV IVS:        0.94 cm LVOT diam:     2.00 cm LVOT Area:     3.14 cm  LEFT ATRIUM         Index LA diam:    2.10 cm 1.40 cm/m                        PULMONIC VALVE AORTA                 PV Vmax:        0.78 m/s Ao Root diam: 2.50 cm PV Peak grad:   2.4 mmHg                       RVOT Peak grad: 3 mmHg   SHUNTS Systemic Diam: 2.00 cm Yolonda Kida MD Electronically signed by Yolonda Kida MD Signature Date/Time: 02/01/2020/9:59:57 AM    Final        Subjective: No complaints.  Discharge Exam: Vitals:   02/02/20 0814 02/02/20 1214  BP: (!) 144/88 128/83  Pulse: 83 90  Resp: 19 17  Temp: 98.4 F (36.9 C) 98.6 F (37 C)  SpO2: 100% 100%   Vitals:   02/01/20 1834 02/02/20 0327 02/02/20 0814 02/02/20 1214  BP: (!) 153/89 (!) 143/83 (!) 144/88 128/83  Pulse: 72 68 83 90  Resp: 18 16 19 17   Temp:  99.5 F (37.5 C) 97.7 F (36.5 C) 98.4 F (36.9 C) 98.6 F (37 C)  TempSrc: Oral Oral Oral Oral  SpO2: 100% 98% 100% 100%  Weight: 49.1 kg     Height: 5\' 2"  (1.575 m)       General: Pt is alert, awake, not in acute distress Cardiovascular: RRR, S1/S2 +, no rubs, no gallops Respiratory: CTA bilaterally, no wheezing, no rhonchi Abdominal: Soft, NT, ND, bowel sounds + Extremities: no edema, no cyanosis    The results of significant diagnostics from this hospitalization (including imaging, microbiology, ancillary and laboratory) are listed below for reference.     Microbiology: Recent Results (from the past 240 hour(s))  Respiratory Panel by RT PCR (Flu A&B, Covid) - Nasopharyngeal Swab     Status: None   Collection Time: 01/31/20  8:42 PM   Specimen: Nasopharyngeal Swab  Result Value Ref Range Status   SARS Coronavirus 2 by RT PCR NEGATIVE NEGATIVE Final    Comment: (NOTE) SARS-CoV-2 target nucleic acids are NOT DETECTED.  The SARS-CoV-2 RNA is generally detectable in upper respiratoy specimens during the acute phase of infection. The lowest concentration of SARS-CoV-2 viral copies this assay can detect is 131 copies/mL. A negative result does not preclude SARS-Cov-2 infection and should not be used as the sole basis for treatment or other patient management decisions. A negative result may occur with  improper specimen collection/handling, submission of specimen other than nasopharyngeal swab, presence of viral mutation(s) within the areas targeted by this assay, and inadequate number of viral copies (<131 copies/mL). A negative result must be combined with clinical observations, patient history, and epidemiological information. The expected result is Negative.  Fact Sheet for Patients:  PinkCheek.be  Fact Sheet for Healthcare Providers:  GravelBags.it  This test is no t yet approved or cleared by the [place] FDA and  has been authorized for detection and/or diagnosis of SARS-CoV-2 by FDA under an Emergency Use Authorization (EUA). This EUA will remain  in effect (meaning this test can be used) for the duration of the COVID-19 declaration under Section 564(b)(1) of the Act, 21 U.S.C. section 360bbb-3(b)(1), unless the authorization is terminated or revoked sooner.     Influenza A by PCR NEGATIVE NEGATIVE Final   Influenza B by PCR NEGATIVE NEGATIVE Final    Comment: (NOTE) The Xpert Xpress SARS-CoV-2/FLU/RSV assay is intended as an aid in  the diagnosis of influenza from Nasopharyngeal swab specimens and  should not be used as a sole basis for treatment. Nasal washings and  aspirates are unacceptable for Xpert Xpress SARS-CoV-2/FLU/RSV  testing.  Fact Sheet for Patients: PinkCheek.be  Fact Sheet for Healthcare Providers: GravelBags.it  This test is not yet approved or cleared by the [place] FDA and  has been authorized for detection and/or diagnosis of SARS-CoV-2 by  FDA under an Emergency Use Authorization (EUA). This EUA will remain  in effect (meaning this test can be used) for the duration of the  Covid-19 declaration under Section 564(b)(1) of the Act, 21  U.S.C. section 360bbb-3(b)(1), unless the authorization is  terminated or revoked. Performed at [facility], [address] [place], [place] [address]      Labs: BNP (last 3 results) No results for input(s): BNP in the last 8760 hours. Basic Metabolic Panel: Recent Labs  Lab 01/31/20 1234 01/31/20 1315 02/01/20 0513  NA 140 137 140  K 5.0 3.8 3.7  CL 102 101 107  CO2 26 25 25   GLUCOSE 110* 129* 95  BUN 10 11 9   CREATININE 0.90 0.82 0.91  CALCIUM 10.0 9.7 9.1  MG  --  2.2 2.3   Liver Function Tests: Recent Labs  Lab 01/31/20 1234 01/31/20 1315  AST 21 25  ALT 12 15  ALKPHOS 65 56  BILITOT 0.6 1.0  PROT 7.6 8.0  ALBUMIN  4.8 4.6   No results for input(s): LIPASE, AMYLASE in the last 168 hours. No results for input(s): AMMONIA in the last 168 hours. CBC: Recent Labs  Lab 01/31/20 1234 01/31/20 1315 02/01/20 0513  WBC 4.7 4.9 4.8  HGB 14.1 13.7 12.7  HCT 42.0 42.4 38.7  MCV 92 94.6 93.9  PLT 287 289 240   Cardiac Enzymes: No results for input(s): CKTOTAL, CKMB, CKMBINDEX, TROPONINI in the last 168 hours. BNP: Invalid input(s): POCBNP CBG: No results for input(s): GLUCAP in the last 168 hours. D-Dimer No results for input(s): DDIMER in the last 72 hours. Hgb A1c No results for input(s): HGBA1C in the last 72 hours. Lipid Profile No results for input(s): CHOL, HDL, LDLCALC, TRIG, CHOLHDL, LDLDIRECT in the last 72 hours. Thyroid function studies Recent Labs    02/01/20 0513  TSH 2.797   Anemia work up No results for input(s): VITAMINB12, FOLATE, FERRITIN, TIBC, IRON, RETICCTPCT in the last 72 hours. Urinalysis    Component Value Date/Time   COLORURINE YELLOW 02/23/2016 1119   APPEARANCEUR CLEAR 02/23/2016 1119   LABSPEC <=1.005 (A) 02/23/2016 1119   PHURINE 6.0 02/23/2016 1119   GLUCOSEU NEGATIVE 02/23/2016 1119   HGBUR NEGATIVE 02/23/2016 [place] 02/23/2016 1119   BILIRUBINUR neg 10/15/2013 0914   KETONESUR NEGATIVE 02/23/2016 1119   PROTEINUR neg 10/15/2013  9528   UROBILINOGEN 0.2 02/23/2016 1119   NITRITE NEGATIVE 02/23/2016 1119   LEUKOCYTESUR NEGATIVE 02/23/2016 1119   Sepsis Labs Invalid input(s): PROCALCITONIN,  WBC,  LACTICIDVEN Microbiology Recent Results (from the past 240 hour(s))  Respiratory Panel by RT PCR (Flu A&B, Covid) - Nasopharyngeal Swab     Status: None   Collection Time: 01/31/20  8:42 PM   Specimen: Nasopharyngeal Swab  Result Value Ref Range Status   SARS Coronavirus 2 by RT PCR NEGATIVE NEGATIVE Final    Comment: (NOTE) SARS-CoV-2 target nucleic acids are NOT DETECTED.  The SARS-CoV-2 RNA is generally detectable in upper  respiratoy specimens during the acute phase of infection. The lowest concentration of SARS-CoV-2 viral copies this assay can detect is 131 copies/mL. A negative result does not preclude SARS-Cov-2 infection and should not be used as the sole basis for treatment or other patient management decisions. A negative result may occur with  improper specimen collection/handling, submission of specimen other than nasopharyngeal swab, presence of viral mutation(s) within the areas targeted by this assay, and inadequate number of viral copies (<131 copies/mL). A negative result must be combined with clinical observations, patient history, and epidemiological information. The expected result is Negative.  Fact Sheet for Patients:  PinkCheek.be  Fact Sheet for Healthcare Providers:  GravelBags.it  This test is no t yet approved or cleared by the [place] FDA and  has been authorized for detection and/or diagnosis of SARS-CoV-2 by FDA under an Emergency Use Authorization (EUA). This EUA will remain  in effect (meaning this test can be used) for the duration of the COVID-19 declaration under Section 564(b)(1) of the Act, 21 U.S.C. section 360bbb-3(b)(1), unless the authorization is terminated or revoked sooner.     Influenza A by PCR NEGATIVE NEGATIVE Final   Influenza B by PCR NEGATIVE NEGATIVE Final    Comment: (NOTE) The Xpert Xpress SARS-CoV-2/FLU/RSV assay is intended as an aid in  the diagnosis of influenza from Nasopharyngeal swab specimens and  should not be used as a sole basis for treatment. Nasal washings and  aspirates are unacceptable for Xpert Xpress SARS-CoV-2/FLU/RSV  testing.  Fact Sheet for Patients: PinkCheek.be  Fact Sheet for Healthcare Providers: GravelBags.it  This test is not yet approved or cleared by the [place] FDA and  has been  authorized for detection and/or diagnosis of SARS-CoV-2 by  FDA under an Emergency Use Authorization (EUA). This EUA will remain  in effect (meaning this test can be used) for the duration of the  Covid-19 declaration under Section 564(b)(1) of the Act, 21  U.S.C. section 360bbb-3(b)(1), unless the authorization is  terminated or revoked. Performed at [facility], [address] [place], [place] [address]      Time coordinating discharge: Over 30 minutes  SIGNED:   Nolberto Hanlon, MD  [facility] 02/02/2020, 3:10 PM Pager   If 7PM-7AM, please contact night-coverage [email or web address] Password [email or web address]

## 2020-02-05 ENCOUNTER — Telehealth: Payer: Self-pay

## 2020-02-05 NOTE — Telephone Encounter (Signed)
Pt was in the hospital for elevated blood pressure and slightly abnormal EKG. Pt is scheduled for 02/18/20 but is wanting to be seen sooner. Pt would like a call back asap

## 2020-02-05 NOTE — Telephone Encounter (Signed)
Patient has appt cardiology 11/10. Wanted sooner appt with Dr Nicki Reaper. Advised that we could see her that same day at 84. Pt agreed to appt. She is going to monitor her BP and bring readings to appt. Cancelled appt for 11/15. This appt was scheduled prior to admission.

## 2020-02-13 ENCOUNTER — Ambulatory Visit: Payer: 59 | Admitting: Internal Medicine

## 2020-02-13 ENCOUNTER — Other Ambulatory Visit: Payer: Self-pay

## 2020-02-13 DIAGNOSIS — I10 Essential (primary) hypertension: Secondary | ICD-10-CM | POA: Diagnosis not present

## 2020-02-13 DIAGNOSIS — F439 Reaction to severe stress, unspecified: Secondary | ICD-10-CM

## 2020-02-13 DIAGNOSIS — K760 Fatty (change of) liver, not elsewhere classified: Secondary | ICD-10-CM

## 2020-02-13 DIAGNOSIS — K219 Gastro-esophageal reflux disease without esophagitis: Secondary | ICD-10-CM

## 2020-02-13 DIAGNOSIS — R0789 Other chest pain: Secondary | ICD-10-CM | POA: Diagnosis not present

## 2020-02-13 NOTE — Progress Notes (Signed)
Patient ID: Angel Taylor, female   DOB: [birth year], 64 y.o.   MRN: [id number]   Subjective:    Patient ID: Angel Taylor, female    DOB: 1955/09/18, 64 y.o.   MRN: [id number]  HPI This visit occurred during the SARS-CoV-2 public health emergency.  Safety protocols were in place, including screening questions prior to the visit, additional usage of staff PPE, and extensive cleaning of exam room while observing appropriate contact time as indicated for disinfecting solutions.  Patient here for hospital follow up.  Admitted 01/31/20 - 02/02/20 with chest pain.  Diagnosed with atypical chest pain with elevated troponins and slight EKG changes.  Started on 81mg  of aspirin.  Blood pressure was elevated and she was also started on toprol.  ECHO - normal EF with normal wall motion abnormality. Nuclear stress test - low risk. Started on pepcid.  She has been following her blood pressures since discharge.  Recent blood pressures ranging 120-140/60-70s.  No chest pain since discharge. Job is stressful.  No abdominal pain.  Bowels moving.  Saw Dr Clayborn Bigness this am.  Overall feels things are stable currently.    Past Medical History:  Diagnosis Date  . Anemia   . Chronic headaches   . Frequent UTI   . GERD (gastroesophageal reflux disease)   . Pre-eclampsia    Past Surgical History:  Procedure Laterality Date  . ABDOMINAL HYSTERECTOMY     total  . CESAREAN SECTION  1987  . TUBAL LIGATION  1987   Family History  Problem Relation Age of Onset  . Diabetes Mother   . Alzheimer's disease Mother   . Seizures Mother   . Heart disease Father   . Breast cancer Neg Hx    Social History   Socioeconomic History  . Marital status: Married    Spouse name: Not on file  . Number of children: 2  . Years of education: Not on file  . Highest education level: Not on file  Occupational History  . Not on file  Tobacco Use  . Smoking status: Never Smoker  . Smokeless tobacco: Never Used  Substance and Sexual  Activity  . Alcohol use: No    Alcohol/week: 0.0 standard drinks  . Drug use: No  . Sexual activity: Not on file  Other Topics Concern  . Not on file  Social History Narrative  . Not on file   Social Determinants of Health   Financial Resource Strain:   . Difficulty of Paying Living Expenses: Not on file  Food Insecurity:   . Worried About Charity fundraiser in the Last Year: Not on file  . Ran Out of Food in the Last Year: Not on file  Transportation Needs:   . Lack of Transportation (Medical): Not on file  . Lack of Transportation (Non-Medical): Not on file  Physical Activity:   . Days of Exercise per Week: Not on file  . Minutes of Exercise per Session: Not on file  Stress:   . Feeling of Stress : Not on file  Social Connections:   . Frequency of Communication with Friends and Family: Not on file  . Frequency of Social Gatherings with Friends and Family: Not on file  . Attends Religious Services: Not on file  . Active Member of Clubs or Organizations: Not on file  . Attends Archivist Meetings: Not on file  . Marital Status: Not on file    Outpatient Encounter Medications as of 02/13/2020  Medication Sig  .  aspirin EC 81 MG EC tablet Take 1 tablet (81 mg total) by mouth daily. Swallow whole.  . famotidine (PEPCID) 20 MG tablet Take 1 tablet (20 mg total) by mouth daily.  . metoprolol succinate (TOPROL-XL) 25 MG 24 hr tablet Take 0.5 tablets (12.5 mg total) by mouth daily.  . multivitamin-iron-minerals-folic acid (CENTRUM) chewable tablet Chew 1 tablet by mouth daily.  . timolol (BETIMOL) 0.25 % ophthalmic solution Place 1 drop into both eyes daily.   . Travoprost, BAK Free, (TRAVATAN) 0.004 % SOLN ophthalmic solution Place 1 drop into both eyes at bedtime.   No facility-administered encounter medications on file as of 02/13/2020.    Review of Systems  Constitutional: Negative for appetite change and unexpected weight change.  HENT: Negative for congestion  and sinus pressure.   Respiratory: Negative for cough, chest tightness and shortness of breath.   Cardiovascular: Negative for chest pain, palpitations and leg swelling.  Gastrointestinal: Negative for abdominal pain, diarrhea, nausea and vomiting.  Genitourinary: Negative for difficulty urinating and dysuria.  Musculoskeletal: Negative for joint swelling and myalgias.  Skin: Negative for color change and rash.  Neurological: Negative for dizziness, light-headedness and headaches.  Psychiatric/Behavioral: Negative for agitation and dysphoric mood.       Objective:    Physical Exam Vitals reviewed.  Constitutional:      General: She is not in acute distress.    Appearance: Normal appearance.  HENT:     Head: Normocephalic and atraumatic.     Right Ear: External ear normal.     Left Ear: External ear normal.  Eyes:     General:        Right eye: No discharge.        Left eye: No discharge.     Conjunctiva/sclera: Conjunctivae normal.  Neck:     Thyroid: No thyromegaly.  Cardiovascular:     Rate and Rhythm: Normal rate and regular rhythm.  Pulmonary:     Effort: No respiratory distress.     Breath sounds: Normal breath sounds. No wheezing.  Abdominal:     General: Bowel sounds are normal.     Palpations: Abdomen is soft.     Tenderness: There is no abdominal tenderness.  Musculoskeletal:        General: No swelling or tenderness.     Cervical back: Neck supple. No tenderness.  Lymphadenopathy:     Cervical: No cervical adenopathy.  Skin:    Findings: No erythema or rash.  Neurological:     Mental Status: She is alert.  Psychiatric:        Mood and Affect: Mood normal.        Behavior: Behavior normal.     BP 132/78   Pulse (!) 59   Temp (!) 97.5 F (36.4 C) (Oral)   Resp 16   Ht 5\' 2"  (1.575 m)   Wt 112 lb (50.8 kg)   SpO2 99%   BMI 20.49 kg/m  Wt Readings from Last 3 Encounters:  02/13/20 112 lb (50.8 kg)  02/01/20 108 lb 3.2 oz (49.1 kg)  01/31/20 114  lb (51.7 kg)     Lab Results  Component Value Date   WBC 4.8 02/01/2020   HGB 12.7 02/01/2020   HCT 38.7 02/01/2020   PLT 240 02/01/2020   GLUCOSE 95 02/01/2020   CHOL 210 (H) 11/20/2018   TRIG 70.0 11/20/2018   HDL 76.00 11/20/2018   LDLDIRECT 119.7 05/30/2012   LDLCALC 120 (H) 11/20/2018   ALT 15  01/31/2020   AST 25 01/31/2020   NA 140 02/01/2020   K 3.7 02/01/2020   CL 107 02/01/2020   CREATININE 0.91 02/01/2020   BUN 9 02/01/2020   CO2 25 02/01/2020   TSH 2.797 02/01/2020    NM Myocar Multi W/Spect W/Wall Motion / EF  Result Date: 02/02/2020  Blood pressure demonstrated a normal response to exercise.  There was no ST segment deviation noted during stress.  Normal overall left ventricular function of 80%  Exercise 4 minutes  The study is normal.  This is a low risk study.  The left ventricular ejection fraction is hyperdynamic (>65%).  Nuclear stress EF: 80%.  Await stress images   ECHOCARDIOGRAM COMPLETE  Result Date: 02/01/2020    ECHOCARDIOGRAM REPORT   Patient Name:   ALIZAYA OSHEA Date of Exam: 02/01/2020 Medical Rec #:  [id number]     Height:       62.0 in Accession #:    [id number]    Weight:       114.0 lb Date of Birth:  01/11/1956     BSA:          1.505 m Patient Age:    65 years      BP:           111/78 mmHg Patient Gender: F             HR:           77 bpm. Exam Location:  [facility] Procedure: 2D Echo, Cardiac Doppler and Color Doppler Indications:     Chest pain 786.50  History:         Patient has no prior history of Echocardiogram examinations.                  Frequent chronic headaches.  Sonographer:     Sherrie Sport RDCS (AE) Referring Phys:  [id number] Rhetta Mura Diagnosing Phys: Yolonda Kida MD  Sonographer Comments: No apical window. IMPRESSIONS  1. TDS.  2. Left ventricular ejection fraction, by estimation, is 65 to 70%. The left ventricle has normal function. The left ventricle has no regional wall motion abnormalities. Left ventricular  diastolic parameters were normal.  3. Right ventricular systolic function is normal. The right ventricular size is normal.  4. The mitral valve is normal in structure. No evidence of mitral valve regurgitation.  5. The aortic valve is normal in structure. Aortic valve regurgitation is not visualized. Conclusion(s)/Recommendation(s): Poor windows for evaluation of left ventricular function by transthoracic echocardiography. Would recommend an alternative means of evaluation. FINDINGS  Left Ventricle: Left ventricular ejection fraction, by estimation, is 65 to 70%. The left ventricle has normal function. The left ventricle has no regional wall motion abnormalities. The left ventricular internal cavity size was normal in size. There is  no left ventricular hypertrophy. Left ventricular diastolic parameters were normal. Right Ventricle: The right ventricular size is normal. No increase in right ventricular wall thickness. Right ventricular systolic function is normal. Left Atrium: Left atrial size was normal in size. Right Atrium: Right atrial size was normal in size. Pericardium: There is no evidence of pericardial effusion. Mitral Valve: The mitral valve is normal in structure. No evidence of mitral valve regurgitation. Tricuspid Valve: The tricuspid valve is normal in structure. Tricuspid valve regurgitation is not demonstrated. Aortic Valve: The aortic valve is normal in structure. Aortic valve regurgitation is not visualized. Pulmonic Valve: The pulmonic valve was normal in structure. Pulmonic valve regurgitation is not visualized.  Aorta: The ascending aorta was not well visualized. IAS/Shunts: No atrial level shunt detected by color flow Doppler. Additional Comments: TDS.  LEFT VENTRICLE PLAX 2D LVIDd:         3.62 cm LVIDs:         2.09 cm LV PW:         0.87 cm LV IVS:        0.94 cm LVOT diam:     2.00 cm LVOT Area:     3.14 cm  LEFT ATRIUM         Index LA diam:    2.10 cm 1.40 cm/m                         PULMONIC VALVE AORTA                 PV Vmax:        0.78 m/s Ao Root diam: 2.50 cm PV Peak grad:   2.4 mmHg                       RVOT Peak grad: 3 mmHg   SHUNTS Systemic Diam: 2.00 cm Yolonda Kida MD Electronically signed by Yolonda Kida MD Signature Date/Time: 02/01/2020/9:59:57 AM    Final        Assessment & Plan:   Problem List Items Addressed This Visit    Stress    Increased stress with work.  Discussed stress.  Stable.  Follow.        Hypertension, essential    Blood pressure elevated recently.  Started on toprol.  Blood pressure as outlined.  Have her spot check her pressure.  Continue toprol.  Follow pressures.  Follow metabolic panel.        GERD (gastroesophageal reflux disease)    strated on pepcid in hospital.  Stable.  Follow.        Fatty liver    Previous ultrasound revealed fatty liver.  Follow liver panel.       Atypical chest pain    Admitted recently with chest pain.  W/up as outlined.  No pain now.  Elevated blood pressure.  Outside checks as outlined.  Continue toprol.  Schedule f/u - to follow up regarding pressures.  Follow metabolic panel.           Einar Pheasant, MD

## 2020-02-16 ENCOUNTER — Encounter: Payer: Self-pay | Admitting: Internal Medicine

## 2020-02-16 NOTE — Assessment & Plan Note (Signed)
Admitted recently with chest pain.  W/up as outlined.  No pain now.  Elevated blood pressure.  Outside checks as outlined.  Continue toprol.  Schedule f/u - to follow up regarding pressures.  Follow metabolic panel.

## 2020-02-16 NOTE — Assessment & Plan Note (Signed)
Previous ultrasound revealed fatty liver.  Follow liver panel.

## 2020-02-16 NOTE — Assessment & Plan Note (Signed)
strated on pepcid in hospital.  Stable.  Follow.

## 2020-02-16 NOTE — Assessment & Plan Note (Signed)
Increased stress with work.  Discussed stress.  Stable.  Follow.

## 2020-02-16 NOTE — Assessment & Plan Note (Signed)
Blood pressure elevated recently.  Started on toprol.  Blood pressure as outlined.  Have her spot check her pressure.  Continue toprol.  Follow pressures.  Follow metabolic panel.

## 2020-02-18 ENCOUNTER — Telehealth: Payer: Self-pay | Admitting: Internal Medicine

## 2020-03-14 ENCOUNTER — Ambulatory Visit: Payer: 59 | Admitting: Internal Medicine

## 2020-03-14 ENCOUNTER — Other Ambulatory Visit: Payer: Self-pay

## 2020-03-14 ENCOUNTER — Encounter: Payer: Self-pay | Admitting: Internal Medicine

## 2020-03-14 DIAGNOSIS — F439 Reaction to severe stress, unspecified: Secondary | ICD-10-CM | POA: Diagnosis not present

## 2020-03-14 DIAGNOSIS — K219 Gastro-esophageal reflux disease without esophagitis: Secondary | ICD-10-CM

## 2020-03-14 DIAGNOSIS — I10 Essential (primary) hypertension: Secondary | ICD-10-CM | POA: Diagnosis not present

## 2020-03-14 NOTE — Progress Notes (Signed)
Patient ID: Angel Taylor, female   DOB: 1955/07/10, 64 y.o.   MRN: [id number]   Subjective:    Patient ID: Angel Taylor, female    DOB: 1956-03-30, 64 y.o.   MRN: [id number]  HPI This visit occurred during the SARS-CoV-2 public health emergency.  Safety protocols were in place, including screening questions prior to the visit, additional usage of staff PPE, and extensive cleaning of exam room while observing appropriate contact time as indicated for disinfecting solutions.  Patient here for a scheduled follow up.  Here to follow up regarding her blood pressure.  Recent admission for chest pain.  Started on metoprolol and aspirin.  Nuclear stress test - low risk.  Has continued on metoprolol.  Blood pressures recently averaging 113-128/60s.  No chest pain or sob reported.  No abdominal pain or bowel change reported.  Handling stress.  Help at work.    Past Medical History:  Diagnosis Date  . Anemia   . Chronic headaches   . Frequent UTI   . GERD (gastroesophageal reflux disease)   . Pre-eclampsia    Past Surgical History:  Procedure Laterality Date  . ABDOMINAL HYSTERECTOMY     total  . CESAREAN SECTION  1987  . TUBAL LIGATION  1987   Family History  Problem Relation Age of Onset  . Diabetes Mother   . Alzheimer's disease Mother   . Seizures Mother   . Heart disease Father   . Breast cancer Neg Hx    Social History   Socioeconomic History  . Marital status: Married    Spouse name: Not on file  . Number of children: 2  . Years of education: Not on file  . Highest education level: Not on file  Occupational History  . Not on file  Tobacco Use  . Smoking status: Never Smoker  . Smokeless tobacco: Never Used  Substance and Sexual Activity  . Alcohol use: No    Alcohol/week: 0.0 standard drinks  . Drug use: No  . Sexual activity: Not on file  Other Topics Concern  . Not on file  Social History Narrative  . Not on file   Social Determinants of Health   Financial  Resource Strain: Not on file  Food Insecurity: Not on file  Transportation Needs: Not on file  Physical Activity: Not on file  Stress: Not on file  Social Connections: Not on file    Outpatient Encounter Medications as of 03/14/2020  Medication Sig  . aspirin EC 81 MG EC tablet Take 1 tablet (81 mg total) by mouth daily. Swallow whole.  . multivitamin-iron-minerals-folic acid (CENTRUM) chewable tablet Chew 1 tablet by mouth daily.  . timolol (BETIMOL) 0.25 % ophthalmic solution Place 1 drop into both eyes daily.   . Travoprost, BAK Free, (TRAVATAN) 0.004 % SOLN ophthalmic solution Place 1 drop into both eyes at bedtime.  . famotidine (PEPCID) 20 MG tablet Take 1 tablet (20 mg total) by mouth daily.  . metoprolol succinate (TOPROL-XL) 25 MG 24 hr tablet Take 0.5 tablets (12.5 mg total) by mouth daily.   No facility-administered encounter medications on file as of 03/14/2020.    Review of Systems  Constitutional: Negative for appetite change and unexpected weight change.  HENT: Negative for congestion and sinus pressure.   Respiratory: Negative for cough, chest tightness and shortness of breath.   Cardiovascular: Negative for chest pain, palpitations and leg swelling.  Gastrointestinal: Negative for abdominal pain, diarrhea, nausea and vomiting.  Genitourinary: Negative for difficulty  urinating and dysuria.  Musculoskeletal: Negative for joint swelling and myalgias.  Skin: Negative for color change and rash.  Neurological: Negative for dizziness, light-headedness and headaches.  Psychiatric/Behavioral: Negative for agitation and dysphoric mood.       Objective:    Physical Exam Vitals reviewed.  Constitutional:      General: She is not in acute distress.    Appearance: Normal appearance.  HENT:     Head: Normocephalic and atraumatic.     Right Ear: External ear normal.     Left Ear: External ear normal.     Mouth/Throat:     Mouth: Oropharynx is clear and moist.  Eyes:      General: No scleral icterus.       Right eye: No discharge.        Left eye: No discharge.     Conjunctiva/sclera: Conjunctivae normal.  Neck:     Thyroid: No thyromegaly.  Cardiovascular:     Rate and Rhythm: Normal rate and regular rhythm.  Pulmonary:     Effort: No respiratory distress.     Breath sounds: Normal breath sounds. No wheezing.  Abdominal:     General: Bowel sounds are normal.     Palpations: Abdomen is soft.     Tenderness: There is no abdominal tenderness.  Musculoskeletal:        General: No swelling, tenderness or edema.     Cervical back: Neck supple. No tenderness.  Lymphadenopathy:     Cervical: No cervical adenopathy.  Skin:    Findings: No erythema or rash.  Neurological:     Mental Status: She is alert.  Psychiatric:        Mood and Affect: Mood normal.        Behavior: Behavior normal.     BP 110/70 (BP Location: Left Arm, Patient Position: Sitting)   Pulse (!) 57   Temp (!) 97.4 F (36.3 C)   Ht 5\' 2"  (1.575 m)   Wt 112 lb 1.9 oz (50.9 kg)   SpO2 99%   BMI 20.51 kg/m  Wt Readings from Last 3 Encounters:  03/14/20 112 lb 1.9 oz (50.9 kg)  02/13/20 112 lb (50.8 kg)  02/01/20 108 lb 3.2 oz (49.1 kg)     Lab Results  Component Value Date   WBC 4.8 02/01/2020   HGB 12.7 02/01/2020   HCT 38.7 02/01/2020   PLT 240 02/01/2020   GLUCOSE 95 02/01/2020   CHOL 210 (H) 11/20/2018   TRIG 70.0 11/20/2018   HDL 76.00 11/20/2018   LDLDIRECT 119.7 05/30/2012   LDLCALC 120 (H) 11/20/2018   ALT 15 01/31/2020   AST 25 01/31/2020   NA 140 02/01/2020   K 3.7 02/01/2020   CL 107 02/01/2020   CREATININE 0.91 02/01/2020   BUN 9 02/01/2020   CO2 25 02/01/2020   TSH 2.797 02/01/2020    NM Myocar Multi W/Spect W/Wall Motion / EF  Result Date: 02/02/2020  Blood pressure demonstrated a normal response to exercise.  There was no ST segment deviation noted during stress.  Normal overall left ventricular function of 80%  Exercise 4 minutes  The  study is normal.  This is a low risk study.  The left ventricular ejection fraction is hyperdynamic (>65%).  Nuclear stress EF: 80%.  Await stress images   ECHOCARDIOGRAM COMPLETE  Result Date: 02/01/2020    ECHOCARDIOGRAM REPORT   Patient Name:   Angel Taylor Date of Exam: 02/01/2020 Medical Rec #:  [id number]  Height:       62.0 in Accession #:    [id number]    Weight:       114.0 lb Date of Birth:  [birth year]     BSA:          1.505 m Patient Age:    64 years      BP:           111/78 mmHg Patient Gender: F             HR:           77 bpm. Exam Location:  [facility] Procedure: 2D Echo, Cardiac Doppler and Color Doppler Indications:     Chest pain 786.50  History:         Patient has no prior history of Echocardiogram examinations.                  Frequent chronic headaches.  Sonographer:     Sherrie Sport RDCS (AE) Referring Phys:  [id number] Rhetta Mura Diagnosing Phys: Yolonda Kida MD  Sonographer Comments: No apical window. IMPRESSIONS  1. TDS.  2. Left ventricular ejection fraction, by estimation, is 65 to 70%. The left ventricle has normal function. The left ventricle has no regional wall motion abnormalities. Left ventricular diastolic parameters were normal.  3. Right ventricular systolic function is normal. The right ventricular size is normal.  4. The mitral valve is normal in structure. No evidence of mitral valve regurgitation.  5. The aortic valve is normal in structure. Aortic valve regurgitation is not visualized. Conclusion(s)/Recommendation(s): Poor windows for evaluation of left ventricular function by transthoracic echocardiography. Would recommend an alternative means of evaluation. FINDINGS  Left Ventricle: Left ventricular ejection fraction, by estimation, is 65 to 70%. The left ventricle has normal function. The left ventricle has no regional wall motion abnormalities. The left ventricular internal cavity size was normal in size. There is  no left ventricular hypertrophy. Left  ventricular diastolic parameters were normal. Right Ventricle: The right ventricular size is normal. No increase in right ventricular wall thickness. Right ventricular systolic function is normal. Left Atrium: Left atrial size was normal in size. Right Atrium: Right atrial size was normal in size. Pericardium: There is no evidence of pericardial effusion. Mitral Valve: The mitral valve is normal in structure. No evidence of mitral valve regurgitation. Tricuspid Valve: The tricuspid valve is normal in structure. Tricuspid valve regurgitation is not demonstrated. Aortic Valve: The aortic valve is normal in structure. Aortic valve regurgitation is not visualized. Pulmonic Valve: The pulmonic valve was normal in structure. Pulmonic valve regurgitation is not visualized. Aorta: The ascending aorta was not well visualized. IAS/Shunts: No atrial level shunt detected by color flow Doppler. Additional Comments: TDS.  LEFT VENTRICLE PLAX 2D LVIDd:         3.62 cm LVIDs:         2.09 cm LV PW:         0.87 cm LV IVS:        0.94 cm LVOT diam:     2.00 cm LVOT Area:     3.14 cm  LEFT ATRIUM         Index LA diam:    2.10 cm 1.40 cm/m                        PULMONIC VALVE AORTA                 PV Vmax:  0.78 m/s Ao Root diam: 2.50 cm PV Peak grad:   2.4 mmHg                       RVOT Peak grad: 3 mmHg   SHUNTS Systemic Diam: 2.00 cm Yolonda Kida MD Electronically signed by Yolonda Kida MD Signature Date/Time: 02/01/2020/9:59:57 AM    Final        Assessment & Plan:   Problem List Items Addressed This Visit    Stress    Increased stress at work.  Discussed.  Handling things relatively well.  Follow.       Hypertension, essential    Blood pressure as outlined.  On toprol.  Blood pressure doing better.  Follow pressures.  Follow metabolic panel.       GERD (gastroesophageal reflux disease)    No upper symptoms reported.  Started on pepcid in hospital.           Einar Pheasant, MD

## 2020-03-15 ENCOUNTER — Encounter: Payer: Self-pay | Admitting: Internal Medicine

## 2020-03-15 NOTE — Assessment & Plan Note (Signed)
Increased stress at work.  Discussed.  Handling things relatively well.  Follow.

## 2020-03-15 NOTE — Assessment & Plan Note (Signed)
No upper symptoms reported.  Started on pepcid in hospital.

## 2020-03-15 NOTE — Assessment & Plan Note (Signed)
Blood pressure as outlined.  On toprol.  Blood pressure doing better.  Follow pressures.  Follow metabolic panel.

## 2020-04-14 ENCOUNTER — Other Ambulatory Visit: Payer: Self-pay

## 2020-04-14 ENCOUNTER — Ambulatory Visit
Admission: EM | Admit: 2020-04-14 | Discharge: 2020-04-14 | Disposition: A | Discharge status: Home / Self Care | Payer: 59 | Attending: Family Medicine | Admitting: Family Medicine

## 2020-04-14 ENCOUNTER — Telehealth: Payer: Self-pay

## 2020-04-14 ENCOUNTER — Encounter: Payer: Self-pay | Admitting: Family Medicine

## 2020-04-14 DIAGNOSIS — R3915 Urgency of urination: Secondary | ICD-10-CM | POA: Diagnosis not present

## 2020-04-14 LAB — POCT URINALYSIS DIP (MANUAL ENTRY)
Bilirubin, UA: NEGATIVE
Blood, UA: NEGATIVE
Glucose, UA: NEGATIVE mg/dL
Ketones, POC UA: NEGATIVE mg/dL
Leukocytes, UA: NEGATIVE
Nitrite, UA: NEGATIVE
Protein Ur, POC: NEGATIVE mg/dL
Spec Grav, UA: <=1.005 — AB (ref 1.010–1.025)
Urobilinogen, UA: 0.2 E.U./dL
pH, UA: 5.5 (ref 5.0–8.0)

## 2020-04-14 NOTE — Discharge Instructions (Addendum)
Your urine did not show any infection today.  We are culturing it.  Recommend follow-up with your doctor as needed Push fluids Treat for constipation in case this is the cause.

## 2020-04-14 NOTE — ED Triage Notes (Signed)
Pt c/o pressure/discomfort with urination, low back pain, urinary urgency, and hematuria onset Friday with chills last night.  Denies n/v/d. No OTC for symptoms.

## 2020-04-14 NOTE — Telephone Encounter (Signed)
Please call and triage.  

## 2020-04-14 NOTE — Telephone Encounter (Signed)
Sent Mychart.  Spoken to patient, she went to UC today and her BP was elevated.  She wanted Dr Nicki Reaper to look at her BP and the other UC provider was going to send her a message. Patient was following up on message.

## 2020-04-14 NOTE — Telephone Encounter (Signed)
Pt has questions about blood pressure medication along with aspirin. She states that her bp is high. She had a death in the family this morning. She just would like a call back for advice.

## 2020-04-14 NOTE — Telephone Encounter (Signed)
Confirm no acute issues.  If no, then we have one reading elevated in the setting of urgent visit.  Please have her recheck this pm and am - call with readings in am.  If any acute issues, needs to be seen.  We can schedule appt also.

## 2020-04-14 NOTE — Telephone Encounter (Signed)
Spoken to patients husband she is currently at work and is heading home. He stated I can send a mychart to contact patient.

## 2020-04-14 NOTE — Telephone Encounter (Signed)
Patient stated no acute issues. She stated she will comply with directions.

## 2020-04-14 NOTE — ED Provider Notes (Signed)
Angel Taylor    CSN: [id number] Arrival date & time: 04/14/20  0831      History   Chief Complaint Chief Complaint  Patient presents with  . Dysuria    HPI Angel Taylor is a 65 y.o. female.   Patient is a 65 year old female who presents today for pressure, discomfort with urination.  She has had some mild low back pain, urinary urgency and hematuria since Friday.  Some chills last night.  No nausea, vomiting or diarrhea.  No flank pain.  History of chronic UTIs     Past Medical History:  Diagnosis Date  . Anemia   . Chronic headaches   . Frequent UTI   . GERD (gastroesophageal reflux disease)   . Pre-eclampsia     Patient Active Problem List   Diagnosis Date Noted  . Elevated troponin 02/01/2020  . Atypical chest pain 01/31/2020  . Fatty liver 02/29/2016  . Hypertension, essential 02/29/2016  . Increased pressure in the eye 02/29/2016  . Neck nodule 02/29/2016  . Neck pain 02/23/2016  . Constipation 06/22/2014  . Stress 06/22/2014  . Health care maintenance 06/22/2014  . Rash 05/20/2014  . Cough 05/20/2014  . Fullness of breast 05/20/2014  . Costochondritis 05/11/2013  . URI (upper respiratory infection) 04/29/2013  . Degenerative disc disease, cervical 06/11/2012  . History of colonic polyps 06/11/2012  . GERD (gastroesophageal reflux disease) 06/11/2012    Past Surgical History:  Procedure Laterality Date  . ABDOMINAL HYSTERECTOMY     total  . CESAREAN SECTION  1987  . TUBAL LIGATION  1987    OB History   No obstetric history on file.      Home Medications    Prior to Admission medications   Medication Sig Start Date End Date Taking? Authorizing Provider  aspirin EC 81 MG EC tablet Take 1 tablet (81 mg total) by mouth daily. Swallow whole. 02/03/20  Yes Nolberto Hanlon, MD  famotidine (PEPCID) 20 MG tablet Take 1 tablet (20 mg total) by mouth daily. 02/03/20 03/04/20 Yes Nolberto Hanlon, MD  metoprolol succinate (TOPROL-XL) 25 MG 24 hr  tablet Take 0.5 tablets (12.5 mg total) by mouth daily. 02/03/20 03/04/20 Yes Nolberto Hanlon, MD  multivitamin-iron-minerals-folic acid (CENTRUM) chewable tablet Chew 1 tablet by mouth daily.    [provider]  timolol (BETIMOL) 0.25 % ophthalmic solution Place 1 drop into both eyes daily.     [provider]  Travoprost, BAK Free, (TRAVATAN) 0.004 % SOLN ophthalmic solution Place 1 drop into both eyes at bedtime. 01/12/20   [provider]    Family History Family History  Problem Relation Age of Onset  . Diabetes Mother   . Alzheimer's disease Mother   . Seizures Mother   . Heart disease Father   . Breast cancer Neg Hx     Social History Social History   Tobacco Use  . Smoking status: Never Smoker  . Smokeless tobacco: Never Used  Substance Use Topics  . Alcohol use: No    Alcohol/week: 0.0 standard drinks  . Drug use: No     Allergies   Bactrim [sulfamethoxazole-trimethoprim], Macrobid [nitrofurantoin macrocrystal], Nitrofurantoin, and Sulfa antibiotics   Review of Systems Review of Systems   Physical Exam Triage Vital Signs ED Triage Vitals  Enc Vitals Group     BP 04/14/20 0909 (!) 172/72     Pulse Rate 04/14/20 0909 62     Resp 04/14/20 0909 16     Temp 04/14/20 0909 Marland Kitchen)  97.3 F (36.3 C)     Temp Source 04/14/20 0909 Tympanic     SpO2 04/14/20 0909 97 %     Weight --      Height --      Head Circumference --      Peak Flow --      Pain Score 04/14/20 0919 0     Pain Loc --      Pain Edu? --      Excl. in [place]? --    No data found.  Updated Vital Signs BP (!) 172/72 (BP Location: Left Arm)   Pulse 62   Temp (!) 97.3 F (36.3 C) (Tympanic)   Resp 16   SpO2 97%   Visual Acuity Right Eye Distance:   Left Eye Distance:   Bilateral Distance:    Right Eye Near:   Left Eye Near:    Bilateral Near:     Physical Exam Vitals and nursing note reviewed.  Constitutional:      General: She is not in acute distress.     Appearance: Normal appearance. She is not ill-appearing, toxic-appearing or diaphoretic.  HENT:     Head: Normocephalic.     Nose: Nose normal.  Eyes:     Conjunctiva/sclera: Conjunctivae normal.  Pulmonary:     Effort: Pulmonary effort is normal.  Musculoskeletal:        General: Normal range of motion.     Cervical back: Normal range of motion.  Skin:    General: Skin is warm and dry.     Findings: No rash.  Neurological:     Mental Status: She is alert.  Psychiatric:        Mood and Affect: Mood normal.      UC Treatments / Results  Labs (all labs ordered are listed, but only abnormal results are displayed) Labs Reviewed  POCT URINALYSIS DIP (MANUAL ENTRY) - Abnormal; Notable for the following components:      Result Value   Spec Grav, UA <=1.005 (*)    All other components within normal limits  URINE CULTURE    EKG   Radiology No results found.  Procedures Procedures (including critical care time)  Medications Ordered in UC Medications - No data to display  Initial Impression / Assessment and Plan / UC Course  I have reviewed the triage vital signs and the nursing notes.  Pertinent labs & imaging results that were available during my care of the patient were reviewed by me and considered in my medical decision making (see chart for details).     Urinary urgency Urine without any signs of infection today.  Sending for culture. Recommend push fluids. Treat for constipation in case this is the cause of her symptoms See primary care for BP and other concerns  Final Clinical Impressions(s) / UC Diagnoses   Final diagnoses:  Urinary urgency     Discharge Instructions     Your urine did not show any infection today.  We are culturing it.  Recommend follow-up with your doctor as needed Push fluids Treat for constipation in case this is the cause.     ED Prescriptions    None     PDMP not reviewed this encounter.   Orvan July, NP 04/14/20  0945

## 2020-04-15 LAB — URINE CULTURE: Culture: NO GROWTH

## 2020-04-15 NOTE — Telephone Encounter (Signed)
If persistent elevation in blood pressure, can schedule f/u appt.  I have sent you a message about the work in appt on Thursday.  I know there are other appt slots available.

## 2020-04-15 NOTE — Telephone Encounter (Signed)
BP last night was 138/81 p80 this morning 154/97 p68 Pt is having a slight headache on and off

## 2020-04-15 NOTE — Telephone Encounter (Signed)
LMTCB

## 2020-04-18 NOTE — Telephone Encounter (Signed)
Pt scheduled for appt with Dr Nicki Reaper. Did not want to schedule beginning of week due to weather so she is scheduled for 1/20 at 8:00

## 2020-04-24 ENCOUNTER — Ambulatory Visit: Payer: 59 | Admitting: Internal Medicine

## 2020-04-24 ENCOUNTER — Encounter: Payer: Self-pay | Admitting: Internal Medicine

## 2020-04-24 ENCOUNTER — Other Ambulatory Visit: Payer: Self-pay

## 2020-04-24 VITALS — BP 130/80 | HR 61 | Temp 97.5°F | Resp 16 | Ht 62.0 in | Wt 110.8 lb

## 2020-04-24 DIAGNOSIS — K5900 Constipation, unspecified: Secondary | ICD-10-CM | POA: Diagnosis not present

## 2020-04-24 DIAGNOSIS — I10 Essential (primary) hypertension: Secondary | ICD-10-CM

## 2020-04-24 DIAGNOSIS — F439 Reaction to severe stress, unspecified: Secondary | ICD-10-CM | POA: Diagnosis not present

## 2020-04-24 MED ORDER — TELMISARTAN 20 MG PO TABS: 20.0000 mg | ORAL_TABLET | Freq: Every day | ORAL | 1 refills | Status: DC

## 2020-04-24 MED ORDER — METOPROLOL SUCCINATE ER 25 MG PO TB24: 12.5000 mg | ORAL_TABLET | Freq: Every day | ORAL | 1 refills | Status: DC

## 2020-04-24 NOTE — Progress Notes (Unsigned)
Patient ID: Angel Taylor, female   DOB: 1956/03/14, 65 y.o.   MRN: [id number]   Subjective:    Patient ID: Angel Taylor, female    DOB: 1955-10-22, 65 y.o.   MRN: [id number]  HPI This visit occurred during the SARS-CoV-2 public health emergency.  Safety protocols were in place, including screening questions prior to the visit, additional usage of staff PPE, and extensive cleaning of exam room while observing appropriate contact time as indicated for disinfecting solutions.  Patient here for work in with concerns regarding elevated blood pressure.  She was seen recently and placed on metoprolol.  Blood pressure has been varying.  Elevated at times.  Outside blood pressure readings reviewed.  Increased stress.  Mother-n-law recently passed away from covid.  Increased stress at work.  Discussed counseling and further intervention.  She has good support.  Does not feel needs anything more at this time.  Eating and drinking.  No nausea or vomiting.  Some constipation.  No chest pain or sob reported.  Did notice pink tinge - when wiped her vaginal area.  Is s/p hysterectomy.  Past Medical History:  Diagnosis Date  . Anemia   . Chronic headaches   . Frequent UTI   . GERD (gastroesophageal reflux disease)   . Pre-eclampsia    Past Surgical History:  Procedure Laterality Date  . ABDOMINAL HYSTERECTOMY     total  . CESAREAN SECTION  1987  . TUBAL LIGATION  1987   Family History  Problem Relation Age of Onset  . Diabetes Mother   . Alzheimer's disease Mother   . Seizures Mother   . Heart disease Father   . Breast cancer Neg Hx    Social History   Socioeconomic History  . Marital status: Married    Spouse name: Not on file  . Number of children: 2  . Years of education: Not on file  . Highest education level: Not on file  Occupational History  . Not on file  Tobacco Use  . Smoking status: Never Smoker  . Smokeless tobacco: Never Used  Substance and Sexual Activity  . Alcohol use:  No    Alcohol/week: 0.0 standard drinks  . Drug use: No  . Sexual activity: Not on file  Other Topics Concern  . Not on file  Social History Narrative  . Not on file   Social Determinants of Health   Financial Resource Strain: Not on file  Food Insecurity: Not on file  Transportation Needs: Not on file  Physical Activity: Not on file  Stress: Not on file  Social Connections: Not on file    Outpatient Encounter Medications as of 04/24/2020  Medication Sig  . telmisartan (MICARDIS) 20 MG tablet Take 1 tablet (20 mg total) by mouth daily.  . [DISCONTINUED] metoprolol succinate (TOPROL-XL) 25 MG 24 hr tablet Take 0.5 tablets (12.5 mg total) by mouth daily.  Marland Kitchen aspirin EC 81 MG EC tablet Take 1 tablet (81 mg total) by mouth daily. Swallow whole.  . famotidine (PEPCID) 20 MG tablet Take 1 tablet (20 mg total) by mouth daily.  . metoprolol succinate (TOPROL-XL) 25 MG 24 hr tablet Take 0.5 tablets (12.5 mg total) by mouth daily.  . multivitamin-iron-minerals-folic acid (CENTRUM) chewable tablet Chew 1 tablet by mouth daily.  . timolol (BETIMOL) 0.25 % ophthalmic solution Place 1 drop into both eyes daily.   . Travoprost, BAK Free, (TRAVATAN) 0.004 % SOLN ophthalmic solution Place 1 drop into both eyes at bedtime.  No facility-administered encounter medications on file as of 04/24/2020.    Review of Systems  Constitutional: Negative for appetite change and unexpected weight change.  HENT: Negative for congestion and sinus pressure.   Respiratory: Negative for cough, chest tightness and shortness of breath.   Cardiovascular: Negative for chest pain, palpitations and leg swelling.  Gastrointestinal: Negative for abdominal pain, diarrhea, nausea and vomiting.       Some constipation.   Genitourinary: Negative for difficulty urinating and dysuria.  Musculoskeletal: Negative for joint swelling and myalgias.  Skin: Negative for color change and rash.  Neurological: Negative for dizziness,  light-headedness and headaches.  Psychiatric/Behavioral: Negative for agitation and dysphoric mood.       Increased stress as outlined.        Objective:    Physical Exam Vitals reviewed.  Constitutional:      General: She is not in acute distress.    Appearance: Normal appearance.  HENT:     Head: Normocephalic and atraumatic.     Right Ear: External ear normal.     Left Ear: External ear normal.     Mouth/Throat:     Mouth: Oropharynx is clear and moist.  Eyes:     General: No scleral icterus.       Right eye: No discharge.        Left eye: No discharge.  Neck:     Thyroid: No thyromegaly.  Cardiovascular:     Rate and Rhythm: Normal rate and regular rhythm.  Pulmonary:     Effort: No respiratory distress.     Breath sounds: Normal breath sounds. No wheezing.  Abdominal:     General: Bowel sounds are normal.     Palpations: Abdomen is soft.     Tenderness: There is no abdominal tenderness.  Musculoskeletal:        General: No swelling, tenderness or edema.     Cervical back: Neck supple. No tenderness.  Lymphadenopathy:     Cervical: No cervical adenopathy.  Skin:    Findings: No erythema or rash.  Neurological:     Mental Status: She is alert.  Psychiatric:        Mood and Affect: Mood normal.        Behavior: Behavior normal.     BP 130/80   Pulse 61   Temp (!) 97.5 F (36.4 C) (Oral)   Resp 16   Ht 5\' 2"  (1.575 m)   Wt 110 lb 12.8 oz (50.3 kg)   SpO2 99%   BMI 20.27 kg/m  Wt Readings from Last 3 Encounters:  04/24/20 110 lb 12.8 oz (50.3 kg)  03/14/20 112 lb 1.9 oz (50.9 kg)  02/13/20 112 lb (50.8 kg)     Lab Results  Component Value Date   WBC 4.8 02/01/2020   HGB 12.7 02/01/2020   HCT 38.7 02/01/2020   PLT 240 02/01/2020   GLUCOSE 95 02/01/2020   CHOL 210 (H) 11/20/2018   TRIG 70.0 11/20/2018   HDL 76.00 11/20/2018   LDLDIRECT 119.7 05/30/2012   LDLCALC 120 (H) 11/20/2018   ALT 15 01/31/2020   AST 25 01/31/2020   NA 140 02/01/2020    K 3.7 02/01/2020   CL 107 02/01/2020   CREATININE 0.91 02/01/2020   BUN 9 02/01/2020   CO2 25 02/01/2020   TSH 2.797 02/01/2020       Assessment & Plan:   Problem List Items Addressed This Visit    Constipation - Primary    Discussed  drinking prune juice and taking miralax.  These work for her.  Can do on a regular basis.  Follow.  Colonoscopy 06/2018.  Recommended f/u in 10 years.        Hypertension, essential    Blood pressure remains elevated.  Outside readings reviewed.  Varying. Increased on recheck.  Continue metoprolol.  Add micardis 20mg  q day.  Check metabolic panel in 08-65 days.  Follow pressures.        Relevant Medications   telmisartan (MICARDIS) 20 MG tablet   metoprolol succinate (TOPROL-XL) 25 MG 24 hr tablet   Other Relevant Orders   Comprehensive metabolic panel   Lipid panel   Urinalysis, Routine w reflex microscopic   Stress    Increased stress. Discussed.  Has good support.  Overall handling things relatively well. Follow.            Einar Pheasant, MD

## 2020-04-27 ENCOUNTER — Encounter: Payer: Self-pay | Admitting: Internal Medicine

## 2020-04-27 NOTE — Assessment & Plan Note (Signed)
Increased stress. Discussed.  Has good support.  Overall handling things relatively well. Follow.

## 2020-04-27 NOTE — Assessment & Plan Note (Signed)
Blood pressure remains elevated.  Outside readings reviewed.  Varying. Increased on recheck.  Continue metoprolol.  Add micardis 20mg  q day.  Check metabolic panel in 61-53 days.  Follow pressures.

## 2020-04-27 NOTE — Assessment & Plan Note (Signed)
Discussed drinking prune juice and taking miralax.  These work for her.  Can do on a regular basis.  Follow.  Colonoscopy 06/2018.  Recommended f/u in 10 years.

## 2020-05-05 ENCOUNTER — Telehealth: Payer: Self-pay

## 2020-05-05 ENCOUNTER — Other Ambulatory Visit: Payer: 59

## 2020-05-05 ENCOUNTER — Encounter: Payer: Self-pay | Admitting: Family Medicine

## 2020-05-05 ENCOUNTER — Other Ambulatory Visit: Payer: Self-pay

## 2020-05-05 ENCOUNTER — Other Ambulatory Visit (INDEPENDENT_AMBULATORY_CARE_PROVIDER_SITE_OTHER): Payer: 59

## 2020-05-05 ENCOUNTER — Telehealth (INDEPENDENT_AMBULATORY_CARE_PROVIDER_SITE_OTHER): Payer: 59 | Admitting: Family Medicine

## 2020-05-05 DIAGNOSIS — I10 Essential (primary) hypertension: Secondary | ICD-10-CM | POA: Diagnosis not present

## 2020-05-05 DIAGNOSIS — J029 Acute pharyngitis, unspecified: Secondary | ICD-10-CM

## 2020-05-05 LAB — URINALYSIS, ROUTINE W REFLEX MICROSCOPIC
Bilirubin Urine: NEGATIVE
Hgb urine dipstick: NEGATIVE
Ketones, ur: NEGATIVE
Leukocytes,Ua: NEGATIVE
Nitrite: NEGATIVE
RBC / HPF: NONE SEEN (ref 0–?)
Specific Gravity, Urine: <=1.005 — AB (ref 1.000–1.030)
Total Protein, Urine: NEGATIVE
Urine Glucose: NEGATIVE
Urobilinogen, UA: 0.2 (ref 0.0–1.0)
WBC, UA: NONE SEEN (ref 0–?)
pH: 6.5 (ref 5.0–8.0)

## 2020-05-05 LAB — COMPREHENSIVE METABOLIC PANEL
ALT: 11 U/L (ref 0–35)
AST: 17 U/L (ref 0–37)
Albumin: 4.4 g/dL (ref 3.5–5.2)
Alkaline Phosphatase: 55 U/L (ref 39–117)
BUN: 14 mg/dL (ref 6–23)
CO2: 28 mEq/L (ref 19–32)
Calcium: 9.6 mg/dL (ref 8.4–10.5)
Chloride: 101 mEq/L (ref 96–112)
Creatinine, Ser: 0.79 mg/dL (ref 0.40–1.20)
GFR: 79.76 mL/min (ref >60.00)
Glucose, Bld: 105 mg/dL — ABNORMAL HIGH (ref 70–99)
Potassium: 4.2 mEq/L (ref 3.5–5.1)
Sodium: 136 mEq/L (ref 135–145)
Total Bilirubin: 0.7 mg/dL (ref 0.2–1.2)
Total Protein: 7.1 g/dL (ref 6.0–8.3)

## 2020-05-05 LAB — LIPID PANEL
Cholesterol: 228 mg/dL — ABNORMAL HIGH (ref 0–200)
HDL: 73.1 mg/dL (ref >39.00)
LDL Cholesterol: 141 mg/dL — ABNORMAL HIGH (ref 0–99)
NonHDL: 155.28
Total CHOL/HDL Ratio: 3
Triglycerides: 71 mg/dL (ref 0.0–149.0)
VLDL: 14.2 mg/dL (ref 0.0–40.0)

## 2020-05-05 NOTE — Assessment & Plan Note (Addendum)
Still above goal.  I would suggest increasing her telmisartan to 40 mg once daily though we need to see what her lab results from earlier today revealed.  The patient did note she has been taking a vitamin/herbal supplement for quite a while.  I encouraged her not to take this given the herbal supplements that are in this supplement.  Discussed that we do not know how these interact with medications.

## 2020-05-05 NOTE — Assessment & Plan Note (Signed)
Possibly related to viral illness.  She did have a negative home Covid test so I discussed that those are not necessarily the most accurate test.  I encouraged her to get a PCR test and she was scheduled for 1 of those later today.  Discussed staying quarantined at least until we have a negative test result.  Work note sent to [employer].  Advised to seek medical attention for any worsening symptoms or breathing issues.

## 2020-05-05 NOTE — Progress Notes (Signed)
Virtual Visit via video Note  This visit type was conducted due to national recommendations for restrictions regarding the COVID-19 pandemic (e.g. social distancing).  This format is felt to be most appropriate for this patient at this time.  All issues noted in this document were discussed and addressed.  No physical exam was performed (except for noted visual exam findings with Video Visits).   I connected with Angel Taylor today at  1:45 PM EST by a video enabled telemedicine application and verified that I am speaking with the correct person using two identifiers. Location patient: home Location provider: work  Persons participating in the virtual visit: patient, provider  I discussed the limitations, risks, security and privacy concerns of performing an evaluation and management service by telephone and the availability of in person appointments. I also discussed with the patient that there may be a patient responsible charge related to this service. The patient expressed understanding and agreed to proceed.  Reason for visit: same day visit  HPI: HYPERTENSION  Disease Monitoring  Home BP Monitoring 122-158/75-90 Chest pain- no    Dyspnea- no Medications  Compliance-  taking metoprolol XL 12.5 mg daily, telmisartan 20 mg daily.   Edema- no  Sore throat: Patient notes this started 05/02/2020.  She noted left ear pain and discomfort on the left side of her in her throat.  No significant congestion.  No cough.  She notes it has improved some at this time.  She had an exam at [facility] and they suggested she have a Covid test.  She took a home COVID test that was negative yesterday.  She has been vaccinated and received her booster.     ROS: See pertinent positives and negatives per HPI.  Past Medical History:  Diagnosis Date  . Anemia   . Chronic headaches   . Frequent UTI   . GERD (gastroesophageal reflux disease)   . Pre-eclampsia     Past Surgical History:  Procedure  Laterality Date  . ABDOMINAL HYSTERECTOMY     total  . CESAREAN SECTION  1987  . TUBAL LIGATION  1987    Family History  Problem Relation Age of Onset  . Diabetes Mother   . Alzheimer's disease Mother   . Seizures Mother   . Heart disease Father   . Breast cancer Neg Hx     SOCIAL HX: Non-smoker   Current Outpatient Medications:  .  ALPRAZolam (XANAX) 0.25 MG tablet, , Disp: , Rfl:  .  aspirin EC 81 MG EC tablet, Take 1 tablet (81 mg total) by mouth daily. Swallow whole., Disp: 30 tablet, Rfl: 11 .  metoprolol succinate (TOPROL-XL) 25 MG 24 hr tablet, Take 0.5 tablets (12.5 mg total) by mouth daily., Disp: 45 tablet, Rfl: 1 .  multivitamin-iron-minerals-folic acid (CENTRUM) chewable tablet, Chew 1 tablet by mouth daily., Disp: , Rfl:  .  telmisartan (MICARDIS) 20 MG tablet, Take 1 tablet (20 mg total) by mouth daily., Disp: 30 tablet, Rfl: 1 .  famotidine (PEPCID) 20 MG tablet, Take 1 tablet (20 mg total) by mouth daily., Disp: 30 tablet, Rfl: 0  EXAM:  VITALS per patient if applicable:  GENERAL: alert, oriented, appears well and in no acute distress  HEENT: atraumatic, conjunttiva clear, no obvious abnormalities on inspection of external nose and ears  NECK: normal movements of the head and neck  LUNGS: on inspection no signs of respiratory distress, breathing rate appears normal, no obvious gross SOB, gasping or wheezing  CV:  no obvious cyanosis  MS: moves all visible extremities without noticeable abnormality  PSYCH/NEURO: pleasant and cooperative, no obvious depression or anxiety, speech and thought processing grossly intact  ASSESSMENT AND PLAN:  Discussed the following assessment and plan:  Problem List Items Addressed This Visit    Hypertension, essential    Still above goal.  I would suggest increasing her telmisartan to 40 mg once daily though we need to see what her lab results from earlier today revealed.  The patient did note she has been taking a  vitamin/herbal supplement for quite a while.  I encouraged her not to take this given the herbal supplements that are in this supplement.  Discussed that we do not know how these interact with medications.      Sore throat    Possibly related to viral illness.  She did have a negative home Covid test so I discussed that those are not necessarily the most accurate test.  I encouraged her to get a PCR test and she was scheduled for 1 of those later today.  Discussed staying quarantined at least until we have a negative test result.  Work note sent to [employer].  Advised to seek medical attention for any worsening symptoms or breathing issues.      Relevant Orders   Novel Coronavirus, NAA ([facility])       I discussed the assessment and treatment plan with the patient. The patient was provided an opportunity to ask questions and all were answered. The patient agreed with the plan and demonstrated an understanding of the instructions.   The patient was advised to call back or seek an in-person evaluation if the symptoms worsen or if the condition fails to improve as anticipated.   Tommi Rumps, MD

## 2020-05-05 NOTE — Telephone Encounter (Signed)
Pt came in for lab work and wanted to see someone about bp readings. I called Fransisco Beau and he advised that I could put her in to see Dr Caryl Bis this afternoon. Placed her recent bp readings in folder up front.

## 2020-05-05 NOTE — Telephone Encounter (Signed)
She saw Dr Caryl Bis today but I have placed her BP readings in your results folder for review

## 2020-05-06 ENCOUNTER — Encounter: Payer: Self-pay | Admitting: Internal Medicine

## 2020-05-06 ENCOUNTER — Encounter: Payer: Self-pay | Admitting: Family Medicine

## 2020-05-06 DIAGNOSIS — E7800 Pure hypercholesterolemia, unspecified: Secondary | ICD-10-CM | POA: Insufficient documentation

## 2020-05-06 NOTE — Telephone Encounter (Signed)
Reviewed.  She saw Dr Caryl Bis.  He increased her micardis to 40mg  q day.  He is also testing her for covid.  Please confirm doing ok.

## 2020-05-07 ENCOUNTER — Other Ambulatory Visit: Payer: Self-pay

## 2020-05-07 LAB — SARS-COV-2, NAA 2 DAY TAT

## 2020-05-07 LAB — NOVEL CORONAVIRUS, NAA: SARS-CoV-2, NAA: NOT DETECTED

## 2020-05-07 MED ORDER — TELMISARTAN 40 MG PO TABS
40.0000 mg | ORAL_TABLET | Freq: Every day | ORAL | 1 refills | Status: DC
Start: 2020-05-07 — End: 2020-05-29

## 2020-05-07 NOTE — Telephone Encounter (Signed)
See lab result note.

## 2020-05-12 ENCOUNTER — Other Ambulatory Visit: Payer: Self-pay | Admitting: Family Medicine

## 2020-05-12 DIAGNOSIS — I10 Essential (primary) hypertension: Secondary | ICD-10-CM

## 2020-05-14 NOTE — Telephone Encounter (Signed)
LMTCB

## 2020-05-14 NOTE — Telephone Encounter (Signed)
Please call pt. If she is unable to swallow - food not going down - she does need to be evaluated.

## 2020-05-15 ENCOUNTER — Other Ambulatory Visit: Payer: Self-pay | Admitting: Internal Medicine

## 2020-05-15 DIAGNOSIS — Z1231 Encounter for screening mammogram for malignant neoplasm of breast: Secondary | ICD-10-CM

## 2020-05-16 ENCOUNTER — Other Ambulatory Visit: Payer: Self-pay | Admitting: Internal Medicine

## 2020-05-16 NOTE — Telephone Encounter (Signed)
Called patient. She is feeling better today. She would like to wait through the weekend and see how she feels

## 2020-05-29 ENCOUNTER — Other Ambulatory Visit: Payer: Self-pay | Admitting: Internal Medicine

## 2020-05-30 ENCOUNTER — Other Ambulatory Visit: Payer: Self-pay

## 2020-05-30 ENCOUNTER — Ambulatory Visit
Admission: RE | Admit: 2020-05-30 | Discharge: 2020-05-30 | Disposition: A | Discharge status: Home / Self Care | Payer: 59 | Source: Ambulatory Visit | Attending: Internal Medicine | Admitting: Internal Medicine

## 2020-05-30 ENCOUNTER — Other Ambulatory Visit (INDEPENDENT_AMBULATORY_CARE_PROVIDER_SITE_OTHER): Payer: 59

## 2020-05-30 DIAGNOSIS — Z1231 Encounter for screening mammogram for malignant neoplasm of breast: Secondary | ICD-10-CM

## 2020-05-30 DIAGNOSIS — I10 Essential (primary) hypertension: Secondary | ICD-10-CM | POA: Diagnosis not present

## 2020-05-30 LAB — BASIC METABOLIC PANEL
BUN: 19 mg/dL (ref 6–23)
CO2: 27 mEq/L (ref 19–32)
Calcium: 9.8 mg/dL (ref 8.4–10.5)
Chloride: 104 mEq/L (ref 96–112)
Creatinine, Ser: 1.02 mg/dL (ref 0.40–1.20)
GFR: 58.67 mL/min — ABNORMAL LOW (ref >60.00)
Glucose, Bld: 101 mg/dL — ABNORMAL HIGH (ref 70–99)
Potassium: 4.6 mEq/L (ref 3.5–5.1)
Sodium: 141 mEq/L (ref 135–145)

## 2020-06-22 ENCOUNTER — Other Ambulatory Visit: Payer: Self-pay | Admitting: Internal Medicine

## 2020-06-27 ENCOUNTER — Ambulatory Visit (INDEPENDENT_AMBULATORY_CARE_PROVIDER_SITE_OTHER): Payer: 59 | Admitting: Internal Medicine

## 2020-06-27 ENCOUNTER — Other Ambulatory Visit: Payer: Self-pay

## 2020-06-27 ENCOUNTER — Encounter: Payer: Self-pay | Admitting: Internal Medicine

## 2020-06-27 VITALS — BP 112/80 | HR 67 | Temp 97.8°F | Ht 62.0 in | Wt 106.8 lb

## 2020-06-27 DIAGNOSIS — K219 Gastro-esophageal reflux disease without esophagitis: Secondary | ICD-10-CM | POA: Diagnosis not present

## 2020-06-27 DIAGNOSIS — F439 Reaction to severe stress, unspecified: Secondary | ICD-10-CM | POA: Diagnosis not present

## 2020-06-27 DIAGNOSIS — I10 Essential (primary) hypertension: Secondary | ICD-10-CM

## 2020-06-27 DIAGNOSIS — E7800 Pure hypercholesterolemia, unspecified: Secondary | ICD-10-CM

## 2020-06-27 DIAGNOSIS — K760 Fatty (change of) liver, not elsewhere classified: Secondary | ICD-10-CM

## 2020-06-27 DIAGNOSIS — Z0000 Encounter for general adult medical examination without abnormal findings: Secondary | ICD-10-CM | POA: Diagnosis not present

## 2020-06-27 MED ORDER — SERTRALINE HCL 25 MG PO TABS: 25.0000 mg | ORAL_TABLET | Freq: Every day | ORAL | 1 refills | Status: DC

## 2020-06-27 NOTE — Assessment & Plan Note (Addendum)
Physical today 06/27/20.  PAP 02/02/19 - negative with negative HPV.  Mammogram 06/02/20 - Birads I.  Colonoscopy 06/2018.

## 2020-06-27 NOTE — Progress Notes (Signed)
Patient ID: Angel Taylor, female   DOB: 11-30-1955, 65 y.o.   MRN: [id number]   Subjective:    Patient ID: Angel Taylor, female    DOB: [birth year], 65 y.o.   MRN: [id number]  HPI This visit occurred during the SARS-CoV-2 public health emergency.  Safety protocols were in place, including screening questions prior to the visit, additional usage of staff PPE, and extensive cleaning of exam room while observing appropriate contact time as indicated for disinfecting solutions.  Patient here for her physical exam. She has been having issues with elevated blood pressure.  micardis added and she is not taking 40mg  q day.  On 1/2 metoprolol.  Blood pressures appear to be doing better.  She did hit her head recently - 06/11/20.  Bumped her head.  No residual headache, but does report the top of her head - will feel some tension at times.  She was questioning if this could be related to her neck.  No dizziness or light headedness.  No chest pain or sob.  No acie reflux.  Doing ok on pepcid.  No abdominal pain.  Bowels moving.  Increased stress.  Discussed.  Discussed treatment.  She does feel she needs something to help level things out.  Discussed medication options.     Past Medical History:  Diagnosis Date  . Anemia   . Chronic headaches   . Frequent UTI   . GERD (gastroesophageal reflux disease)   . Pre-eclampsia    Past Surgical History:  Procedure Laterality Date  . ABDOMINAL HYSTERECTOMY     total  . CESAREAN SECTION  1987  . TUBAL LIGATION  1987   Family History  Problem Relation Age of Onset  . Diabetes Mother   . Alzheimer's disease Mother   . Seizures Mother   . Heart disease Father   . Breast cancer Neg Hx    Social History   Socioeconomic History  . Marital status: Married    Spouse name: Not on file  . Number of children: 2  . Years of education: Not on file  . Highest education level: Not on file  Occupational History  . Not on file  Tobacco Use  . Smoking status:  Never Smoker  . Smokeless tobacco: Never Used  Substance and Sexual Activity  . Alcohol use: No    Alcohol/week: 0.0 standard drinks  . Drug use: No  . Sexual activity: Not on file  Other Topics Concern  . Not on file  Social History Narrative  . Not on file   Social Determinants of Health   Financial Resource Strain: Not on file  Food Insecurity: Not on file  Transportation Needs: Not on file  Physical Activity: Not on file  Stress: Not on file  Social Connections: Not on file    Outpatient Encounter Medications as of 06/27/2020  Medication Sig  . ALPRAZolam (XANAX) 0.25 MG tablet   . aspirin EC 81 MG EC tablet Take 1 tablet (81 mg total) by mouth daily. Swallow whole.  . multivitamin-iron-minerals-folic acid (CENTRUM) chewable tablet Chew 1 tablet by mouth daily.  . sertraline (ZOLOFT) 25 MG tablet Take 1 tablet (25 mg total) by mouth daily.  Marland Kitchen telmisartan (MICARDIS) 40 MG tablet TAKE 1 TABLET BY MOUTH EVERY DAY  . famotidine (PEPCID) 20 MG tablet Take 1 tablet (20 mg total) by mouth daily.  . metoprolol succinate (TOPROL-XL) 25 MG 24 hr tablet Take 0.5 tablets (12.5 mg total) by mouth daily.   No  facility-administered encounter medications on file as of 06/27/2020.    Review of Systems  Constitutional: Negative for appetite change and unexpected weight change.  HENT: Negative for congestion, sinus pressure and sore throat.   Eyes: Negative for pain and visual disturbance.  Respiratory: Negative for cough, chest tightness and shortness of breath.   Cardiovascular: Negative for chest pain, palpitations and leg swelling.  Gastrointestinal: Negative for abdominal pain, diarrhea, nausea and vomiting.  Genitourinary: Negative for difficulty urinating and dysuria.  Musculoskeletal: Negative for joint swelling and myalgias.  Skin: Negative for color change and rash.  Neurological: Negative for dizziness and light-headedness.       Tension - head as outlined.    Hematological:  Negative for adenopathy. Does not bruise/bleed easily.  Psychiatric/Behavioral: Negative for agitation and dysphoric mood.       Increased stress as outlined.        Objective:    Physical Exam Vitals reviewed.  Constitutional:      General: She is not in acute distress.    Appearance: Normal appearance.  HENT:     Head: Normocephalic and atraumatic.     Right Ear: External ear normal.     Left Ear: External ear normal.  Eyes:     General: No scleral icterus.       Right eye: No discharge.        Left eye: No discharge.     Conjunctiva/sclera: Conjunctivae normal.  Neck:     Thyroid: No thyromegaly.  Cardiovascular:     Rate and Rhythm: Normal rate and regular rhythm.  Pulmonary:     Effort: No respiratory distress.     Breath sounds: Normal breath sounds. No wheezing.  Abdominal:     General: Bowel sounds are normal.     Palpations: Abdomen is soft.     Tenderness: There is no abdominal tenderness.  Musculoskeletal:        General: No swelling or tenderness.     Cervical back: Neck supple. No tenderness.  Lymphadenopathy:     Cervical: No cervical adenopathy.  Skin:    Findings: No erythema or rash.  Neurological:     Mental Status: She is alert.  Psychiatric:        Mood and Affect: Mood normal.        Behavior: Behavior normal.     BP 112/80   Pulse 67   Temp 97.8 F (36.6 C) (Oral)   Ht 5\' 2"  (1.575 m)   Wt 106 lb 12.8 oz (48.4 kg)   SpO2 99%   BMI 19.53 kg/m  Wt Readings from Last 3 Encounters:  06/27/20 106 lb 12.8 oz (48.4 kg)  05/05/20 110 lb (49.9 kg)  04/24/20 110 lb 12.8 oz (50.3 kg)     Lab Results  Component Value Date   WBC 4.8 02/01/2020   HGB 12.7 02/01/2020   HCT 38.7 02/01/2020   PLT 240 02/01/2020   GLUCOSE 101 (H) 05/30/2020   CHOL 228 (H) 05/05/2020   TRIG 71.0 05/05/2020   HDL 73.10 05/05/2020   LDLDIRECT 119.7 05/30/2012   LDLCALC 141 (H) 05/05/2020   ALT 11 05/05/2020   AST 17 05/05/2020   NA 141 05/30/2020   K 4.6  05/30/2020   CL 104 05/30/2020   CREATININE 1.02 05/30/2020   BUN 19 05/30/2020   CO2 27 05/30/2020   TSH 2.797 02/01/2020    MM 3D SCREEN BREAST BILATERAL  Result Date: 06/02/2020 CLINICAL DATA:  Screening. EXAM: DIGITAL SCREENING  BILATERAL MAMMOGRAM WITH TOMOSYNTHESIS AND CAD TECHNIQUE: Bilateral screening digital craniocaudal and mediolateral oblique mammograms were obtained. Bilateral screening digital breast tomosynthesis was performed. The images were evaluated with computer-aided detection. COMPARISON:  Previous exam(s). ACR Breast Density Category c: The breast tissue is heterogeneously dense, which may obscure small masses. FINDINGS: There are no findings suspicious for malignancy. The images were evaluated with computer-aided detection. IMPRESSION: No mammographic evidence of malignancy. A result letter of this screening mammogram will be mailed directly to the patient. RECOMMENDATION: Screening mammogram in one year. (Code:SM-B-01Y) BI-RADS CATEGORY  1: Negative. Electronically Signed   By: Lovey Newcomer M.D.   On: 06/02/2020 17:14       Assessment & Plan:   Problem List Items Addressed This Visit    Fatty liver    Previous ultrasound fatty liver.  Follow liver function tests.        GERD (gastroesophageal reflux disease)    Controlled on pepcid.       Health care maintenance    Physical today 06/27/20.  PAP 02/02/19 - negative with negative HPV.  Mammogram 06/02/20 - Birads I.  Colonoscopy 06/2018.        Hypercholesterolemia    Follow lipid panel and liver function tests.  The 10-year ASCVD risk score Mikey Bussing DC Brooke Bonito., et al., 2013) is: 5.9%   Values used to calculate the score:     Age: 64 years     Sex: Female     Is Non-Hispanic African American: Yes     Diabetic: No     Tobacco smoker: No     Systolic Blood Pressure: 161 mmHg     Is BP treated: Yes     HDL Cholesterol: 73.1 mg/dL     Total Cholesterol: 228 mg/dL      Relevant Orders   Lipid panel   Hepatic  function panel   Hypertension, essential    Blood pressure doing better.  Continue metoprolol and micardis.  Follow pressures.  Follow metabolic panel.       Relevant Orders   Basic metabolic panel   Stress    Increased stress as outlined.  Discussed.  Discussed treatment options.  She does feel she needs something to help level things out.  Start zoloft 90m q day. Get her back in soon to reassess.         Other Visit Diagnoses    Routine general medical examination at a health care facility    -  Primary       Einar Pheasant, MD

## 2020-06-27 NOTE — Progress Notes (Signed)
Pre visit review using our clinic review tool, if applicable. No additional management support is needed unless otherwise documented below in the visit note. 

## 2020-06-29 ENCOUNTER — Encounter: Payer: Self-pay | Admitting: Internal Medicine

## 2020-06-29 NOTE — Assessment & Plan Note (Signed)
Blood pressure doing better.  Continue metoprolol and micardis.  Follow pressures.  Follow metabolic panel.

## 2020-06-29 NOTE — Assessment & Plan Note (Signed)
Controlled on pepcid.

## 2020-06-29 NOTE — Assessment & Plan Note (Signed)
Follow lipid panel and liver function tests.  The 10-year ASCVD risk score Mikey Bussing DC Brooke Bonito., et al., 2013) is: 5.9%   Values used to calculate the score:     Age: 65 years     Sex: Female     Is Non-Hispanic African American: Yes     Diabetic: No     Tobacco smoker: No     Systolic Blood Pressure: 726 mmHg     Is BP treated: Yes     HDL Cholesterol: 73.1 mg/dL     Total Cholesterol: 228 mg/dL

## 2020-06-29 NOTE — Assessment & Plan Note (Signed)
Increased stress as outlined.  Discussed.  Discussed treatment options.  She does feel she needs something to help level things out.  Start zoloft 34m q day. Get her back in soon to reassess.

## 2020-06-29 NOTE — Assessment & Plan Note (Signed)
Previous ultrasound fatty liver.  Follow liver function tests.

## 2020-07-04 ENCOUNTER — Encounter: Payer: Self-pay | Admitting: Internal Medicine

## 2020-07-07 ENCOUNTER — Other Ambulatory Visit: Payer: Self-pay

## 2020-07-07 MED ORDER — TELMISARTAN 40 MG PO TABS: 40.0000 mg | ORAL_TABLET | Freq: Every day | ORAL | 1 refills | Status: DC

## 2020-08-11 ENCOUNTER — Other Ambulatory Visit (INDEPENDENT_AMBULATORY_CARE_PROVIDER_SITE_OTHER): Payer: 59

## 2020-08-11 ENCOUNTER — Other Ambulatory Visit: Payer: Self-pay

## 2020-08-11 DIAGNOSIS — I10 Essential (primary) hypertension: Secondary | ICD-10-CM | POA: Diagnosis not present

## 2020-08-11 DIAGNOSIS — E7800 Pure hypercholesterolemia, unspecified: Secondary | ICD-10-CM | POA: Diagnosis not present

## 2020-08-11 LAB — LIPID PANEL
Cholesterol: 198 mg/dL (ref 0–200)
HDL: 64.1 mg/dL (ref >39.00)
LDL Cholesterol: 115 mg/dL — ABNORMAL HIGH (ref 0–99)
NonHDL: 133.57
Total CHOL/HDL Ratio: 3
Triglycerides: 91 mg/dL (ref 0.0–149.0)
VLDL: 18.2 mg/dL (ref 0.0–40.0)

## 2020-08-11 LAB — HEPATIC FUNCTION PANEL
ALT: 11 U/L (ref 0–35)
AST: 16 U/L (ref 0–37)
Albumin: 4.2 g/dL (ref 3.5–5.2)
Alkaline Phosphatase: 63 U/L (ref 39–117)
Bilirubin, Direct: 0.1 mg/dL (ref 0.0–0.3)
Total Bilirubin: 0.9 mg/dL (ref 0.2–1.2)
Total Protein: 6.9 g/dL (ref 6.0–8.3)

## 2020-08-11 LAB — BASIC METABOLIC PANEL
BUN: 15 mg/dL (ref 6–23)
CO2: 29 mEq/L (ref 19–32)
Calcium: 9.5 mg/dL (ref 8.4–10.5)
Chloride: 105 mEq/L (ref 96–112)
Creatinine, Ser: 0.81 mg/dL (ref 0.40–1.20)
GFR: 77.25 mL/min (ref >60.00)
Glucose, Bld: 96 mg/dL (ref 70–99)
Potassium: 4.6 mEq/L (ref 3.5–5.1)
Sodium: 140 mEq/L (ref 135–145)

## 2020-08-13 ENCOUNTER — Ambulatory Visit (INDEPENDENT_AMBULATORY_CARE_PROVIDER_SITE_OTHER): Payer: 59 | Admitting: Internal Medicine

## 2020-08-13 ENCOUNTER — Other Ambulatory Visit: Payer: Self-pay

## 2020-08-13 DIAGNOSIS — K219 Gastro-esophageal reflux disease without esophagitis: Secondary | ICD-10-CM

## 2020-08-13 DIAGNOSIS — F439 Reaction to severe stress, unspecified: Secondary | ICD-10-CM

## 2020-08-13 DIAGNOSIS — K760 Fatty (change of) liver, not elsewhere classified: Secondary | ICD-10-CM | POA: Diagnosis not present

## 2020-08-13 DIAGNOSIS — I10 Essential (primary) hypertension: Secondary | ICD-10-CM | POA: Diagnosis not present

## 2020-08-13 DIAGNOSIS — Z86010 Personal history of colonic polyps: Secondary | ICD-10-CM

## 2020-08-13 DIAGNOSIS — E7800 Pure hypercholesterolemia, unspecified: Secondary | ICD-10-CM

## 2020-08-13 NOTE — Progress Notes (Signed)
Patient ID: Peony Barner, female   DOB: 02/16/1956, 65 y.o.   MRN: [id number]   Subjective:    Patient ID: [name], female    DOB: 05-23-1955, 65 y.o.   MRN: [id number]  HPI This visit occurred during the SARS-CoV-2 public health emergency.  Safety protocols were in place, including screening questions prior to the visit, additional usage of staff PPE, and extensive cleaning of exam room while observing appropriate contact time as indicated for disinfecting solutions.  Patient here for a scheduled follow up. Here to follow up regarding her blood pressure.  She is taking metoprolol in the evening and micardis in the am.  Blood pressure doing well - averaging 120s/70s.  Handling stress with "natural means".  Not taking zoloft. Feels better.  Stays active.  No chest pain or sob with increased activity or exertion.  Eating.  No nausea or vomiting.  No abdominal pain.  Bowels moving.  Weight stable.    Past Medical History:  Diagnosis Date  . Anemia   . Chronic headaches   . Frequent UTI   . GERD (gastroesophageal reflux disease)   . Pre-eclampsia    Past Surgical History:  Procedure Laterality Date  . ABDOMINAL HYSTERECTOMY     total  . CESAREAN SECTION  1987  . TUBAL LIGATION  1987   Family History  Problem Relation Age of Onset  . Diabetes Mother   . Alzheimer's disease Mother   . Seizures Mother   . Heart disease Father   . Breast cancer Neg Hx    Social History   Socioeconomic History  . Marital status: Married    Spouse name: Not on file  . Number of children: 2  . Years of education: Not on file  . Highest education level: Not on file  Occupational History  . Not on file  Tobacco Use  . Smoking status: Never Smoker  . Smokeless tobacco: Never Used  Substance and Sexual Activity  . Alcohol use: No    Alcohol/week: 0.0 standard drinks  . Drug use: No  . Sexual activity: Not on file  Other Topics Concern  . Not on file  Social History Narrative  . Not on file    Social Determinants of Health   Financial Resource Strain: Not on file  Food Insecurity: Not on file  Transportation Needs: Not on file  Physical Activity: Not on file  Stress: Not on file  Social Connections: Not on file    Outpatient Encounter Medications as of 08/13/2020  Medication Sig  . ALPRAZolam (XANAX) 0.25 MG tablet   . aspirin EC 81 MG EC tablet Take 1 tablet (81 mg total) by mouth daily. Swallow whole.  . famotidine (PEPCID) 20 MG tablet Take 1 tablet (20 mg total) by mouth daily.  . metoprolol succinate (TOPROL-XL) 25 MG 24 hr tablet Take 0.5 tablets (12.5 mg total) by mouth daily.  . multivitamin-iron-minerals-folic acid (CENTRUM) chewable tablet Chew 1 tablet by mouth daily.  . sertraline (ZOLOFT) 25 MG tablet Take 1 tablet (25 mg total) by mouth daily.  Marland Kitchen telmisartan (MICARDIS) 40 MG tablet Take 1 tablet (40 mg total) by mouth daily.   No facility-administered encounter medications on file as of 08/13/2020.    Review of Systems  Constitutional: Negative for appetite change and unexpected weight change.  HENT: Negative for congestion and sinus pressure.   Respiratory: Negative for cough, chest tightness and shortness of breath.   Cardiovascular: Negative for chest pain, palpitations and leg  swelling.  Gastrointestinal: Negative for abdominal pain, diarrhea, nausea and vomiting.  Genitourinary: Negative for difficulty urinating and dysuria.  Musculoskeletal: Negative for joint swelling and myalgias.  Skin: Negative for color change and rash.  Neurological: Negative for dizziness, light-headedness and headaches.  Psychiatric/Behavioral: Negative for agitation and dysphoric mood.       Objective:    Physical Exam Vitals reviewed.  Constitutional:      General: She is not in acute distress.    Appearance: Normal appearance.  HENT:     Head: Normocephalic and atraumatic.     Right Ear: External ear normal.     Left Ear: External ear normal.  Eyes:      General: No scleral icterus.       Right eye: No discharge.        Left eye: No discharge.     Conjunctiva/sclera: Conjunctivae normal.  Neck:     Thyroid: No thyromegaly.  Cardiovascular:     Rate and Rhythm: Normal rate and regular rhythm.  Pulmonary:     Effort: No respiratory distress.     Breath sounds: Normal breath sounds. No wheezing.  Abdominal:     General: Bowel sounds are normal.     Palpations: Abdomen is soft.     Tenderness: There is no abdominal tenderness.  Musculoskeletal:        General: No swelling or tenderness.     Cervical back: Neck supple. No tenderness.  Lymphadenopathy:     Cervical: No cervical adenopathy.  Skin:    Findings: No erythema or rash.  Neurological:     Mental Status: She is alert.  Psychiatric:        Mood and Affect: Mood normal.        Behavior: Behavior normal.     BP 122/72   Pulse 60   Temp (!) 96.3 F (35.7 C) (Temporal)   Resp 16   Ht 5\' 2"  (1.575 m)   Wt 106 lb 12.8 oz (48.4 kg)   SpO2 99%   BMI 19.53 kg/m  Wt Readings from Last 3 Encounters:  08/13/20 106 lb 12.8 oz (48.4 kg)  06/27/20 106 lb 12.8 oz (48.4 kg)  05/05/20 110 lb (49.9 kg)     Lab Results  Component Value Date   WBC 4.8 02/01/2020   HGB 12.7 02/01/2020   HCT 38.7 02/01/2020   PLT 240 02/01/2020   GLUCOSE 96 08/11/2020   CHOL 198 08/11/2020   TRIG 91.0 08/11/2020   HDL 64.10 08/11/2020   LDLDIRECT 119.7 05/30/2012   LDLCALC 115 (H) 08/11/2020   ALT 11 08/11/2020   AST 16 08/11/2020   NA 140 08/11/2020   K 4.6 08/11/2020   CL 105 08/11/2020   CREATININE 0.81 08/11/2020   BUN 15 08/11/2020   CO2 29 08/11/2020   TSH 2.797 02/01/2020    MM 3D SCREEN BREAST BILATERAL  Result Date: 06/02/2020 CLINICAL DATA:  Screening. EXAM: DIGITAL SCREENING BILATERAL MAMMOGRAM WITH TOMOSYNTHESIS AND CAD TECHNIQUE: Bilateral screening digital craniocaudal and mediolateral oblique mammograms were obtained. Bilateral screening digital breast tomosynthesis  was performed. The images were evaluated with computer-aided detection. COMPARISON:  Previous exam(s). ACR Breast Density Category c: The breast tissue is heterogeneously dense, which may obscure small masses. FINDINGS: There are no findings suspicious for malignancy. The images were evaluated with computer-aided detection. IMPRESSION: No mammographic evidence of malignancy. A result letter of this screening mammogram will be mailed directly to the patient. RECOMMENDATION: Screening mammogram in one year. (Code:SM-B-01Y)  BI-RADS CATEGORY  1: Negative. Electronically Signed   By: Lovey Newcomer M.D.   On: 06/02/2020 17:14       Assessment & Plan:   Problem List Items Addressed This Visit    Fatty liver    Previous ultrasound revealed fatty liver.  Follow liver function tests.        GERD (gastroesophageal reflux disease)    Controlled on pepcid.       History of colonic polyps    Colonoscopy 07/2012.  Had more recent colonoscopy.  Obtain results.        Hypercholesterolemia    Follow lipid panel and liver function tests.        Hypertension, essential    Blood pressure doing well on metoprolol and micardis.  Follow pressures.  Follow metabolic panel.       Stress    Increased stress as outlined previously.  Discussed. She is not taking zoloft.  Doing better.  Follow.            Einar Pheasant, MD

## 2020-08-23 ENCOUNTER — Encounter: Payer: Self-pay | Admitting: Internal Medicine

## 2020-08-23 NOTE — Assessment & Plan Note (Signed)
Previous ultrasound revealed fatty liver.  Follow liver function tests.   

## 2020-08-23 NOTE — Assessment & Plan Note (Signed)
Follow lipid panel and liver function tests.

## 2020-08-23 NOTE — Assessment & Plan Note (Signed)
Increased stress as outlined previously.  Discussed. She is not taking zoloft.  Doing better.  Follow.

## 2020-08-23 NOTE — Assessment & Plan Note (Signed)
Controlled on pepcid.  

## 2020-08-23 NOTE — Assessment & Plan Note (Signed)
Colonoscopy 07/2012.  Had more recent colonoscopy.  Obtain results.

## 2020-08-23 NOTE — Assessment & Plan Note (Signed)
Blood pressure doing well on metoprolol and micardis.  Follow pressures.  Follow metabolic panel.

## 2020-09-04 ENCOUNTER — Other Ambulatory Visit: Payer: Self-pay | Admitting: Internal Medicine

## 2020-09-27 ENCOUNTER — Other Ambulatory Visit: Payer: Self-pay | Admitting: Internal Medicine

## 2020-09-29 MED ORDER — METOPROLOL SUCCINATE ER 25 MG PO TB24: 12.5000 mg | ORAL_TABLET | Freq: Every day | ORAL | 1 refills | Status: DC

## 2020-11-19 ENCOUNTER — Other Ambulatory Visit: Payer: Self-pay

## 2020-11-19 ENCOUNTER — Ambulatory Visit: Payer: 59 | Admitting: Internal Medicine

## 2020-11-19 ENCOUNTER — Telehealth: Payer: Self-pay | Admitting: Internal Medicine

## 2020-11-19 DIAGNOSIS — I10 Essential (primary) hypertension: Secondary | ICD-10-CM

## 2020-11-19 DIAGNOSIS — K760 Fatty (change of) liver, not elsewhere classified: Secondary | ICD-10-CM | POA: Diagnosis not present

## 2020-11-19 DIAGNOSIS — F439 Reaction to severe stress, unspecified: Secondary | ICD-10-CM | POA: Diagnosis not present

## 2020-11-19 DIAGNOSIS — E7800 Pure hypercholesterolemia, unspecified: Secondary | ICD-10-CM

## 2020-11-19 DIAGNOSIS — M542 Cervicalgia: Secondary | ICD-10-CM

## 2020-11-19 DIAGNOSIS — Z86010 Personal history of colonic polyps: Secondary | ICD-10-CM

## 2020-11-19 MED ORDER — TELMISARTAN 40 MG PO TABS: 40.0000 mg | ORAL_TABLET | Freq: Every day | ORAL | 1 refills | Status: DC

## 2020-11-19 NOTE — Telephone Encounter (Signed)
Patient forgot to tell Dr Nicki Reaper she needs a refill on her telmisartan (MICARDIS) 40 MG tablet

## 2020-11-19 NOTE — Progress Notes (Signed)
settingsePatient ID: Angel Taylor, female   DOB: [birth year], 65 y.o.   MRN: [id number]   Subjective:    Patient ID: Angel Taylor, female    DOB: [birth year], 65 y.o.   MRN: [id number]  HPI This visit occurred during the SARS-CoV-2 public health emergency.  Safety protocols were in place, including screening questions prior to the visit, additional usage of staff PPE, and extensive cleaning of exam room while observing appropriate contact time as indicated for disinfecting solutions.   Patient here for a scheduled follow up.  Here to follow up regarding blood pressure.  On micardis and metoprolol.  Overall appears to be doing well.  No chest pain or sob reported.  No abdominal pain.  Bowels moving.  Planning to retire in a couple of years.  Saw ophthalmology.  Pressure better.  Still some neck issues.  Better with stretching and exercise.   Marland Kitchen  Past Medical History:  Diagnosis Date   Anemia    Chronic headaches    Frequent UTI    GERD (gastroesophageal reflux disease)    Pre-eclampsia    Past Surgical History:  Procedure Laterality Date   ABDOMINAL HYSTERECTOMY     total   CESAREAN SECTION  1987   TUBAL LIGATION  1987   Family History  Problem Relation Age of Onset   Diabetes Mother    Alzheimer's disease Mother    Seizures Mother    Heart disease Father    Breast cancer Neg Hx    Social History   Socioeconomic History   Marital status: Married    Spouse name: Not on file   Number of children: 2   Years of education: Not on file   Highest education level: Not on file  Occupational History   Not on file  Tobacco Use   Smoking status: Never   Smokeless tobacco: Never  Substance and Sexual Activity   Alcohol use: No    Alcohol/week: 0.0 standard drinks   Drug use: No   Sexual activity: Not on file  Other Topics Concern   Not on file  Social History Narrative   Not on file   Social Determinants of Health   Financial Resource Strain: Not on file  Food Insecurity:  Not on file  Transportation Needs: Not on file  Physical Activity: Not on file  Stress: Not on file  Social Connections: Not on file    Review of Systems  Constitutional:  Negative for appetite change and unexpected weight change.  HENT:  Negative for congestion and sinus pressure.   Respiratory:  Negative for cough, chest tightness and shortness of breath.   Cardiovascular:  Negative for chest pain, palpitations and leg swelling.  Gastrointestinal:  Negative for abdominal pain, diarrhea, nausea and vomiting.  Genitourinary:  Negative for difficulty urinating and dysuria.  Musculoskeletal:  Negative for joint swelling and myalgias.  Skin:  Negative for color change and rash.  Neurological:  Negative for dizziness, light-headedness and headaches.  Psychiatric/Behavioral:  Negative for agitation and dysphoric mood.       Objective:    Physical Exam Vitals reviewed.  Constitutional:      General: She is not in acute distress.    Appearance: Normal appearance.  HENT:     Head: Normocephalic and atraumatic.     Right Ear: External ear normal.     Left Ear: External ear normal.  Eyes:     General: No scleral icterus.       Right eye: No  discharge.        Left eye: No discharge.     Conjunctiva/sclera: Conjunctivae normal.  Neck:     Thyroid: No thyromegaly.  Cardiovascular:     Rate and Rhythm: Normal rate and regular rhythm.  Pulmonary:     Effort: No respiratory distress.     Breath sounds: Normal breath sounds. No wheezing.  Abdominal:     General: Bowel sounds are normal.     Palpations: Abdomen is soft.     Tenderness: There is no abdominal tenderness.  Musculoskeletal:        General: No swelling or tenderness.     Cervical back: Neck supple. No tenderness.  Lymphadenopathy:     Cervical: No cervical adenopathy.  Skin:    Findings: No erythema or rash.  Neurological:     Mental Status: She is alert.  Psychiatric:        Mood and Affect: Mood normal.         Behavior: Behavior normal.    BP 130/78   Pulse 60   Temp (!) 97.2 F (36.2 C)   Resp 16   Ht '5\' 2"'$  (1.575 m)   Wt 108 lb 12.8 oz (49.4 kg)   SpO2 98%   BMI 19.90 kg/m  Wt Readings from Last 3 Encounters:  11/19/20 108 lb 12.8 oz (49.4 kg)  08/13/20 106 lb 12.8 oz (48.4 kg)  06/27/20 106 lb 12.8 oz (48.4 kg)    Outpatient Encounter Medications as of 11/19/2020  Medication Sig   aspirin EC 81 MG EC tablet Take 1 tablet (81 mg total) by mouth daily. Swallow whole.   famotidine (PEPCID) 20 MG tablet Take 1 tablet (20 mg total) by mouth daily.   metoprolol succinate (TOPROL-XL) 25 MG 24 hr tablet Take 0.5 tablets (12.5 mg total) by mouth daily.   multivitamin-iron-minerals-folic acid (CENTRUM) chewable tablet Chew 1 tablet by mouth daily.   [DISCONTINUED] ALPRAZolam (XANAX) 0.25 MG tablet  (Patient not taking: Reported on 11/19/2020)   [DISCONTINUED] sertraline (ZOLOFT) 25 MG tablet Take 1 tablet (25 mg total) by mouth daily. (Patient not taking: Reported on 11/19/2020)   [DISCONTINUED] telmisartan (MICARDIS) 40 MG tablet TAKE ONE TABLET BY MOUTH DAILY   No facility-administered encounter medications on file as of 11/19/2020.     Lab Results  Component Value Date   WBC 4.8 02/01/2020   HGB 12.7 02/01/2020   HCT 38.7 02/01/2020   PLT 240 02/01/2020   GLUCOSE 96 08/11/2020   CHOL 198 08/11/2020   TRIG 91.0 08/11/2020   HDL 64.10 08/11/2020   LDLDIRECT 119.7 05/30/2012   LDLCALC 115 (H) 08/11/2020   ALT 11 08/11/2020   AST 16 08/11/2020   NA 140 08/11/2020   K 4.6 08/11/2020   CL 105 08/11/2020   CREATININE 0.81 08/11/2020   BUN 15 08/11/2020   CO2 29 08/11/2020   TSH 2.797 02/01/2020    MM 3D SCREEN BREAST BILATERAL  Result Date: 06/02/2020 CLINICAL DATA:  Screening. EXAM: DIGITAL SCREENING BILATERAL MAMMOGRAM WITH TOMOSYNTHESIS AND CAD TECHNIQUE: Bilateral screening digital craniocaudal and mediolateral oblique mammograms were obtained. Bilateral screening digital  breast tomosynthesis was performed. The images were evaluated with computer-aided detection. COMPARISON:  Previous exam(s). ACR Breast Density Category c: The breast tissue is heterogeneously dense, which may obscure small masses. FINDINGS: There are no findings suspicious for malignancy. The images were evaluated with computer-aided detection. IMPRESSION: No mammographic evidence of malignancy. A result letter of this screening mammogram will be mailed directly  to the patient. RECOMMENDATION: Screening mammogram in one year. (Code:SM-B-01Y) BI-RADS CATEGORY  1: Negative. Electronically Signed   By: Lovey Newcomer M.D.   On: 06/02/2020 17:14       Assessment & Plan:   Problem List Items Addressed This Visit     Fatty liver    Previous ultrasound revealed fatty liver.  Follow liver function tests.        History of colonic polyps    Colonoscopy 06/2018.       Hypercholesterolemia    Follow lipid panel and liver function tests.        Hypertension, essential    Blood pressure doing better.  Continue metoprolol and micardis.  Follow pressures.  Follow metabolic panel.       Neck pain    Better if stretches and with exercise.  Follow.       Stress    Overall doing better.  Follow.         Einar Pheasant, MD

## 2020-11-24 ENCOUNTER — Encounter: Payer: Self-pay | Admitting: Internal Medicine

## 2020-11-24 NOTE — Assessment & Plan Note (Signed)
Previous ultrasound revealed fatty liver.  Follow liver function tests.   

## 2020-11-24 NOTE — Assessment & Plan Note (Signed)
Follow lipid panel and liver function tests.

## 2020-11-24 NOTE — Assessment & Plan Note (Signed)
Blood pressure doing better.  Continue metoprolol and micardis.  Follow pressures.  Follow metabolic panel.

## 2020-11-24 NOTE — Assessment & Plan Note (Signed)
Overall doing better.  Follow.  

## 2020-11-24 NOTE — Assessment & Plan Note (Signed)
Better if stretches and with exercise.  Follow.

## 2020-11-24 NOTE — Assessment & Plan Note (Signed)
Colonoscopy 06/2018.  

## 2021-01-19 ENCOUNTER — Other Ambulatory Visit: Payer: Self-pay | Admitting: Internal Medicine

## 2021-01-20 ENCOUNTER — Telehealth: Payer: Self-pay | Admitting: Internal Medicine

## 2021-01-20 MED ORDER — TELMISARTAN 40 MG PO TABS: 40.0000 mg | ORAL_TABLET | Freq: Every day | ORAL | 1 refills | Status: DC

## 2021-01-20 NOTE — Telephone Encounter (Signed)
Patient is calling in to request a refill on her telmisartan (MICARDIS) 40 MG tablet.Please send to the [facility] on [address].

## 2021-02-06 ENCOUNTER — Other Ambulatory Visit: Payer: Self-pay

## 2021-02-06 ENCOUNTER — Ambulatory Visit (INDEPENDENT_AMBULATORY_CARE_PROVIDER_SITE_OTHER): Payer: 59 | Admitting: Internal Medicine

## 2021-02-06 ENCOUNTER — Encounter: Payer: Self-pay | Admitting: Internal Medicine

## 2021-02-06 DIAGNOSIS — I10 Essential (primary) hypertension: Secondary | ICD-10-CM | POA: Diagnosis not present

## 2021-02-06 DIAGNOSIS — E7800 Pure hypercholesterolemia, unspecified: Secondary | ICD-10-CM

## 2021-02-06 DIAGNOSIS — F439 Reaction to severe stress, unspecified: Secondary | ICD-10-CM | POA: Diagnosis not present

## 2021-02-06 DIAGNOSIS — K760 Fatty (change of) liver, not elsewhere classified: Secondary | ICD-10-CM | POA: Diagnosis not present

## 2021-02-06 MED ORDER — FAMOTIDINE 20 MG PO TABS: 20.0000 mg | ORAL_TABLET | Freq: Every day | ORAL | 2 refills | Status: DC

## 2021-02-06 NOTE — Progress Notes (Signed)
Patient ID: Angel Taylor, female   DOB: 13-Jan-1956, 65 y.o.   MRN: [id number]   Subjective:    Patient ID: Angel Taylor, female    DOB: 1956/02/24, 65 y.o.   MRN: [id number]  This visit occurred during the SARS-CoV-2 public health emergency.  Safety protocols were in place, including screening questions prior to the visit, additional usage of staff PPE, and extensive cleaning of exam room while observing appropriate contact time as indicated for disinfecting solutions.   Patient here for No chief complaint on file.  Marland Kitchen   HPI    Past Medical History:  Diagnosis Date   Anemia    Chronic headaches    Frequent UTI    GERD (gastroesophageal reflux disease)    Pre-eclampsia    Past Surgical History:  Procedure Laterality Date   ABDOMINAL HYSTERECTOMY     total   CESAREAN SECTION  1987   TUBAL LIGATION  1987   Family History  Problem Relation Age of Onset   Diabetes Mother    Alzheimer's disease Mother    Seizures Mother    Heart disease Father    Breast cancer Neg Hx    Social History   Socioeconomic History   Marital status: Married    Spouse name: Not on file   Number of children: 2   Years of education: Not on file   Highest education level: Not on file  Occupational History   Not on file  Tobacco Use   Smoking status: Never   Smokeless tobacco: Never  Substance and Sexual Activity   Alcohol use: No    Alcohol/week: 0.0 standard drinks   Drug use: No   Sexual activity: Not on file  Other Topics Concern   Not on file  Social History Narrative   Not on file   Social Determinants of Health   Financial Resource Strain: Not on file  Food Insecurity: Not on file  Transportation Needs: Not on file  Physical Activity: Not on file  Stress: Not on file  Social Connections: Not on file     Review of Systems     Objective:     There were no vitals taken for this visit. Wt Readings from Last 3 Encounters:  11/19/20 108 lb 12.8 oz (49.4 kg)  08/13/20 106  lb 12.8 oz (48.4 kg)  06/27/20 106 lb 12.8 oz (48.4 kg)    Physical Exam   Outpatient Encounter Medications as of 02/06/2021  Medication Sig   aspirin EC 81 MG EC tablet Take 1 tablet (81 mg total) by mouth daily. Swallow whole.   famotidine (PEPCID) 20 MG tablet Take 1 tablet (20 mg total) by mouth daily.   metoprolol succinate (TOPROL-XL) 25 MG 24 hr tablet Take 0.5 tablets (12.5 mg total) by mouth daily.   multivitamin-iron-minerals-folic acid (CENTRUM) chewable tablet Chew 1 tablet by mouth daily.   telmisartan (MICARDIS) 40 MG tablet Take 1 tablet (40 mg total) by mouth daily.   No facility-administered encounter medications on file as of 02/06/2021.     Lab Results  Component Value Date   WBC 4.8 02/01/2020   HGB 12.7 02/01/2020   HCT 38.7 02/01/2020   PLT 240 02/01/2020   GLUCOSE 96 08/11/2020   CHOL 198 08/11/2020   TRIG 91.0 08/11/2020   HDL 64.10 08/11/2020   LDLDIRECT 119.7 05/30/2012   LDLCALC 115 (H) 08/11/2020   ALT 11 08/11/2020   AST 16 08/11/2020   NA 140 08/11/2020   K 4.6  08/11/2020   CL 105 08/11/2020   CREATININE 0.81 08/11/2020   BUN 15 08/11/2020   CO2 29 08/11/2020   TSH 2.797 02/01/2020    MM 3D SCREEN BREAST BILATERAL  Result Date: 06/02/2020 CLINICAL DATA:  Screening. EXAM: DIGITAL SCREENING BILATERAL MAMMOGRAM WITH TOMOSYNTHESIS AND CAD TECHNIQUE: Bilateral screening digital craniocaudal and mediolateral oblique mammograms were obtained. Bilateral screening digital breast tomosynthesis was performed. The images were evaluated with computer-aided detection. COMPARISON:  Previous exam(s). ACR Breast Density Category c: The breast tissue is heterogeneously dense, which may obscure small masses. FINDINGS: There are no findings suspicious for malignancy. The images were evaluated with computer-aided detection. IMPRESSION: No mammographic evidence of malignancy. A result letter of this screening mammogram will be mailed directly to the patient.  RECOMMENDATION: Screening mammogram in one year. (Code:SM-B-01Y) BI-RADS CATEGORY  1: Negative. Electronically Signed   By: Lovey Newcomer M.D.   On: 06/02/2020 17:14       Assessment & Plan:   Problem List Items Addressed This Visit   None    Einar Pheasant, MD

## 2021-02-06 NOTE — Progress Notes (Signed)
Patient ID: Angel Taylor, female   DOB: 10/20/1955, 65 y.o.   MRN: [id number]   Subjective:    Patient ID: Angel Taylor, female    DOB: [birth year], 65 y.o.   MRN: [id number]  This visit occurred during the SARS-CoV-2 public health emergency.  Safety protocols were in place, including screening questions prior to the visit, additional usage of staff PPE, and extensive cleaning of exam room while observing appropriate contact time as indicated for disinfecting solutions.   Patient here for a scheduled follow up.     HPI Here to follow up regarding her blood pressure.  On micardis.  Taking 1/2 metoprolol.  Blood pressure overall doing better.  Reviewed outside readings - blood pressures mostly averaging - 120s-130s/70-80s.  No chest pain reported.  Breathing stable.  No increased sob.  No abdominal pain. Bowels moving.     Past Medical History:  Diagnosis Date   Anemia    Chronic headaches    Frequent UTI    GERD (gastroesophageal reflux disease)    Pre-eclampsia    Past Surgical History:  Procedure Laterality Date   ABDOMINAL HYSTERECTOMY     total   CESAREAN SECTION  1987   TUBAL LIGATION  1987   Family History  Problem Relation Age of Onset   Diabetes Mother    Alzheimer's disease Mother    Seizures Mother    Heart disease Father    Breast cancer Neg Hx    Social History   Socioeconomic History   Marital status: Married    Spouse name: Not on file   Number of children: 2   Years of education: Not on file   Highest education level: Not on file  Occupational History   Not on file  Tobacco Use   Smoking status: Never   Smokeless tobacco: Never  Substance and Sexual Activity   Alcohol use: No    Alcohol/week: 0.0 standard drinks   Drug use: No   Sexual activity: Not on file  Other Topics Concern   Not on file  Social History Narrative   Not on file   Social Determinants of Health   Financial Resource Strain: Not on file  Food Insecurity: Not on file   Transportation Needs: Not on file  Physical Activity: Not on file  Stress: Not on file  Social Connections: Not on file     Review of Systems  Constitutional:  Negative for appetite change and unexpected weight change.  HENT:  Negative for congestion and sinus pressure.   Respiratory:  Negative for cough, chest tightness and shortness of breath.   Cardiovascular:  Negative for chest pain, palpitations and leg swelling.  Gastrointestinal:  Negative for abdominal pain, diarrhea, nausea and vomiting.  Genitourinary:  Negative for difficulty urinating and dysuria.  Musculoskeletal:  Negative for joint swelling and myalgias.  Skin:  Negative for color change and rash.  Neurological:  Negative for dizziness, light-headedness and headaches.  Psychiatric/Behavioral:  Negative for agitation and dysphoric mood.       Objective:     BP 102/64 (BP Location: Left Arm, Patient Position: Sitting, Cuff Size: Normal)   Pulse 61   Temp 97.9 F (36.6 C) (Oral)   Ht 5\' 2"  (1.575 m)   Wt 110 lb (49.9 kg)   SpO2 99%   BMI 20.12 kg/m  Wt Readings from Last 3 Encounters:  02/06/21 110 lb (49.9 kg)  11/19/20 108 lb 12.8 oz (49.4 kg)  08/13/20 106 lb 12.8 oz (48.4  kg)    Physical Exam Vitals reviewed.  Constitutional:      General: She is not in acute distress.    Appearance: Normal appearance.  HENT:     Head: Normocephalic and atraumatic.     Right Ear: External ear normal.     Left Ear: External ear normal.  Eyes:     General: No scleral icterus.       Right eye: No discharge.        Left eye: No discharge.     Conjunctiva/sclera: Conjunctivae normal.  Neck:     Thyroid: No thyromegaly.  Cardiovascular:     Rate and Rhythm: Normal rate and regular rhythm.  Pulmonary:     Effort: No respiratory distress.     Breath sounds: Normal breath sounds. No wheezing.  Abdominal:     General: Bowel sounds are normal.     Palpations: Abdomen is soft.     Tenderness: There is no abdominal  tenderness.  Musculoskeletal:        General: No swelling or tenderness.     Cervical back: Neck supple. No tenderness.  Lymphadenopathy:     Cervical: No cervical adenopathy.  Skin:    Findings: No erythema or rash.  Neurological:     Mental Status: She is alert.  Psychiatric:        Mood and Affect: Mood normal.        Behavior: Behavior normal.     Outpatient Encounter Medications as of 02/06/2021  Medication Sig   aspirin EC 81 MG EC tablet Take 1 tablet (81 mg total) by mouth daily. Swallow whole.   famotidine (PEPCID) 20 MG tablet Take 1 tablet (20 mg total) by mouth daily.   multivitamin-iron-minerals-folic acid (CENTRUM) chewable tablet Chew 1 tablet by mouth daily.   telmisartan (MICARDIS) 40 MG tablet Take 1 tablet (40 mg total) by mouth daily.   timolol (TIMOPTIC) 0.25 % ophthalmic solution    metoprolol succinate (TOPROL-XL) 25 MG 24 hr tablet Take 0.5 tablets (12.5 mg total) by mouth daily.   [DISCONTINUED] famotidine (PEPCID) 20 MG tablet Take 1 tablet (20 mg total) by mouth daily.   No facility-administered encounter medications on file as of 02/06/2021.     Lab Results  Component Value Date   WBC 4.8 02/01/2020   HGB 12.7 02/01/2020   HCT 38.7 02/01/2020   PLT 240 02/01/2020   GLUCOSE 96 08/11/2020   CHOL 198 08/11/2020   TRIG 91.0 08/11/2020   HDL 64.10 08/11/2020   LDLDIRECT 119.7 05/30/2012   LDLCALC 115 (H) 08/11/2020   ALT 11 08/11/2020   AST 16 08/11/2020   NA 140 08/11/2020   K 4.6 08/11/2020   CL 105 08/11/2020   CREATININE 0.81 08/11/2020   BUN 15 08/11/2020   CO2 29 08/11/2020   TSH 2.797 02/01/2020    MM 3D SCREEN BREAST BILATERAL  Result Date: 06/02/2020 CLINICAL DATA:  Screening. EXAM: DIGITAL SCREENING BILATERAL MAMMOGRAM WITH TOMOSYNTHESIS AND CAD TECHNIQUE: Bilateral screening digital craniocaudal and mediolateral oblique mammograms were obtained. Bilateral screening digital breast tomosynthesis was performed. The images were  evaluated with computer-aided detection. COMPARISON:  Previous exam(s). ACR Breast Density Category c: The breast tissue is heterogeneously dense, which may obscure small masses. FINDINGS: There are no findings suspicious for malignancy. The images were evaluated with computer-aided detection. IMPRESSION: No mammographic evidence of malignancy. A result letter of this screening mammogram will be mailed directly to the patient. RECOMMENDATION: Screening mammogram in one year. (Code:SM-B-01Y) BI-RADS  CATEGORY  1: Negative. Electronically Signed   By: Lovey Newcomer M.D.   On: 06/02/2020 17:14       Assessment & Plan:   Problem List Items Addressed This Visit     Fatty liver    Previous ultrasound revealed fatty liver.  Follow liver function tests.        Relevant Orders   Hepatic function panel   Hypercholesterolemia    Follow lipid panel and liver function tests.        Relevant Orders   Lipid panel   TSH   CBC with Differential/Platelet   Hypertension, essential    Blood pressure doing better.  Continue metoprolol and micardis.  Follow pressures.  Follow metabolic panel.       Relevant Orders   Basic metabolic panel   Stress    Overall doing better.  Follow.         Einar Pheasant, MD

## 2021-02-15 ENCOUNTER — Encounter: Payer: Self-pay | Admitting: Internal Medicine

## 2021-02-15 NOTE — Assessment & Plan Note (Signed)
Blood pressure doing better.  Continue metoprolol and micardis.  Follow pressures.  Follow metabolic panel.

## 2021-02-15 NOTE — Assessment & Plan Note (Signed)
Previous ultrasound revealed fatty liver.  Follow liver function tests.   

## 2021-02-15 NOTE — Assessment & Plan Note (Signed)
Overall doing better.  Follow.  

## 2021-02-15 NOTE — Assessment & Plan Note (Signed)
Follow lipid panel and liver function tests.

## 2021-04-10 ENCOUNTER — Other Ambulatory Visit (INDEPENDENT_AMBULATORY_CARE_PROVIDER_SITE_OTHER): Payer: 59

## 2021-04-10 ENCOUNTER — Other Ambulatory Visit: Payer: Self-pay

## 2021-04-10 DIAGNOSIS — K760 Fatty (change of) liver, not elsewhere classified: Secondary | ICD-10-CM

## 2021-04-10 DIAGNOSIS — E7800 Pure hypercholesterolemia, unspecified: Secondary | ICD-10-CM

## 2021-04-10 DIAGNOSIS — I10 Essential (primary) hypertension: Secondary | ICD-10-CM | POA: Diagnosis not present

## 2021-04-10 LAB — CBC WITH DIFFERENTIAL/PLATELET
Basophils Absolute: 0 10*3/uL (ref 0.0–0.1)
Basophils Relative: 1 % (ref 0.0–3.0)
Eosinophils Absolute: 0.1 10*3/uL (ref 0.0–0.7)
Eosinophils Relative: 2.4 % (ref 0.0–5.0)
HCT: 39.8 % (ref 36.0–46.0)
Hemoglobin: 12.8 g/dL (ref 12.0–15.0)
Lymphocytes Relative: 61.2 % — ABNORMAL HIGH (ref 12.0–46.0)
Lymphs Abs: 2.8 10*3/uL (ref 0.7–4.0)
MCHC: 32.1 g/dL (ref 30.0–36.0)
MCV: 94.8 fl (ref 78.0–100.0)
Monocytes Absolute: 0.4 10*3/uL (ref 0.1–1.0)
Monocytes Relative: 8.3 % (ref 3.0–12.0)
Neutro Abs: 1.2 10*3/uL — ABNORMAL LOW (ref 1.4–7.7)
Neutrophils Relative %: 27.1 % — ABNORMAL LOW (ref 43.0–77.0)
Platelets: 255 10*3/uL (ref 150.0–400.0)
RBC: 4.19 Mil/uL (ref 3.87–5.11)
RDW: 13 % (ref 11.5–15.5)
WBC: 4.6 10*3/uL (ref 4.0–10.5)

## 2021-04-10 LAB — BASIC METABOLIC PANEL
BUN: 13 mg/dL (ref 6–23)
CO2: 25 mEq/L (ref 19–32)
Calcium: 9.8 mg/dL (ref 8.4–10.5)
Chloride: 102 mEq/L (ref 96–112)
Creatinine, Ser: 0.91 mg/dL (ref 0.40–1.20)
GFR: 66.87 mL/min (ref >60.00)
Glucose, Bld: 98 mg/dL (ref 70–99)
Potassium: 4.8 mEq/L (ref 3.5–5.1)
Sodium: 138 mEq/L (ref 135–145)

## 2021-04-10 LAB — LIPID PANEL
Cholesterol: 226 mg/dL — ABNORMAL HIGH (ref 0–200)
HDL: 72.9 mg/dL (ref >39.00)
LDL Cholesterol: 134 mg/dL — ABNORMAL HIGH (ref 0–99)
NonHDL: 153.26
Total CHOL/HDL Ratio: 3
Triglycerides: 97 mg/dL (ref 0.0–149.0)
VLDL: 19.4 mg/dL (ref 0.0–40.0)

## 2021-04-10 LAB — HEPATIC FUNCTION PANEL
ALT: 14 U/L (ref 0–35)
AST: 17 U/L (ref 0–37)
Albumin: 4.4 g/dL (ref 3.5–5.2)
Alkaline Phosphatase: 58 U/L (ref 39–117)
Bilirubin, Direct: 0.2 mg/dL (ref 0.0–0.3)
Total Bilirubin: 1.2 mg/dL (ref 0.2–1.2)
Total Protein: 7.1 g/dL (ref 6.0–8.3)

## 2021-04-10 LAB — TSH: TSH: 2.07 u[IU]/mL (ref 0.35–5.50)

## 2021-04-11 ENCOUNTER — Encounter: Payer: Self-pay | Admitting: Internal Medicine

## 2021-04-11 DIAGNOSIS — D72820 Lymphocytosis (symptomatic): Secondary | ICD-10-CM | POA: Insufficient documentation

## 2021-04-14 ENCOUNTER — Telehealth: Payer: Self-pay

## 2021-04-14 NOTE — Telephone Encounter (Signed)
LMTCB for lab results.  

## 2021-04-15 NOTE — Telephone Encounter (Signed)
Pt returning call. Pt requesting callback.  °

## 2021-04-16 NOTE — Telephone Encounter (Signed)
Pt aware of results 

## 2021-04-16 NOTE — Telephone Encounter (Signed)
Pt returning call

## 2021-05-03 ENCOUNTER — Other Ambulatory Visit: Payer: Self-pay | Admitting: Internal Medicine

## 2021-05-13 ENCOUNTER — Other Ambulatory Visit: Payer: Self-pay | Admitting: Internal Medicine

## 2021-05-13 DIAGNOSIS — Z1231 Encounter for screening mammogram for malignant neoplasm of breast: Secondary | ICD-10-CM

## 2021-05-14 ENCOUNTER — Other Ambulatory Visit: Payer: Self-pay

## 2021-05-14 ENCOUNTER — Ambulatory Visit (INDEPENDENT_AMBULATORY_CARE_PROVIDER_SITE_OTHER): Payer: 59 | Admitting: Internal Medicine

## 2021-05-14 ENCOUNTER — Encounter: Payer: Self-pay | Admitting: Internal Medicine

## 2021-05-14 VITALS — BP 106/70 | HR 69 | Temp 97.6°F | Resp 16 | Ht 62.0 in | Wt 109.6 lb

## 2021-05-14 DIAGNOSIS — R221 Localized swelling, mass and lump, neck: Secondary | ICD-10-CM

## 2021-05-14 DIAGNOSIS — Z860100 Personal history of colon polyps, unspecified: Secondary | ICD-10-CM

## 2021-05-14 DIAGNOSIS — E7800 Pure hypercholesterolemia, unspecified: Secondary | ICD-10-CM | POA: Diagnosis not present

## 2021-05-14 DIAGNOSIS — K219 Gastro-esophageal reflux disease without esophagitis: Secondary | ICD-10-CM

## 2021-05-14 DIAGNOSIS — I10 Essential (primary) hypertension: Secondary | ICD-10-CM | POA: Diagnosis not present

## 2021-05-14 DIAGNOSIS — D72820 Lymphocytosis (symptomatic): Secondary | ICD-10-CM | POA: Diagnosis not present

## 2021-05-14 DIAGNOSIS — L299 Pruritus, unspecified: Secondary | ICD-10-CM

## 2021-05-14 DIAGNOSIS — F439 Reaction to severe stress, unspecified: Secondary | ICD-10-CM

## 2021-05-14 DIAGNOSIS — K760 Fatty (change of) liver, not elsewhere classified: Secondary | ICD-10-CM

## 2021-05-14 LAB — CBC WITH DIFFERENTIAL/PLATELET
Basophils Absolute: 0 10*3/uL (ref 0.0–0.1)
Basophils Relative: 0.8 % (ref 0.0–3.0)
Eosinophils Absolute: 0.1 10*3/uL (ref 0.0–0.7)
Eosinophils Relative: 3.2 % (ref 0.0–5.0)
HCT: 39.1 % (ref 36.0–46.0)
Hemoglobin: 12.7 g/dL (ref 12.0–15.0)
Lymphocytes Relative: 53.2 % — ABNORMAL HIGH (ref 12.0–46.0)
Lymphs Abs: 2 10*3/uL (ref 0.7–4.0)
MCHC: 32.5 g/dL (ref 30.0–36.0)
MCV: 94.4 fl (ref 78.0–100.0)
Monocytes Absolute: 0.4 10*3/uL (ref 0.1–1.0)
Monocytes Relative: 10.5 % (ref 3.0–12.0)
Neutro Abs: 1.2 10*3/uL — ABNORMAL LOW (ref 1.4–7.7)
Neutrophils Relative %: 32.3 % — ABNORMAL LOW (ref 43.0–77.0)
Platelets: 236 10*3/uL (ref 150.0–400.0)
RBC: 4.14 Mil/uL (ref 3.87–5.11)
RDW: 12.8 % (ref 11.5–15.5)
WBC: 3.8 10*3/uL — ABNORMAL LOW (ref 4.0–10.5)

## 2021-05-14 NOTE — Progress Notes (Signed)
Patient ID: Angel Taylor, female   DOB: [birth year], 66 y.o.   MRN: [id number]   Subjective:    Patient ID: Angel Taylor, female    DOB: 05/10/1955, 66 y.o.   MRN: [id number]  This visit occurred during the SARS-CoV-2 public health emergency.  Safety protocols were in place, including screening questions prior to the visit, additional usage of staff PPE, and extensive cleaning of exam room while observing appropriate contact time as indicated for disinfecting solutions.   Patient here for a scheduled follow up.   Chief Complaint  Patient presents with   Hypertension   Gastroesophageal Reflux   .   HPI Taking pepcid.  This is helping with reflux.  Still some mucus, but is better.  Does miss some days.  Discussed taking regularly.  Persistent nodule - angle of jaw.  Saw ENT.  Wants to hold on f/u.  No chest pain or sob reported.  No cough or congestion.  No abdominal pain or bowel change reported.  Reports blood pressures averaging 130s/70s on outside readings.  Reports itching - face.  Notices more in the evening.  No other areas involved.  No rash.  No lip or tongue swelling.  She is trying to sort through possible etiologies.  Does not occur daily.  Discussed medication.  Doubt, given localized to one area and does not occur daily.  She avoiding her facial cream last night, and did not notice symptoms.     Past Medical History:  Diagnosis Date   Anemia    Chronic headaches    Frequent UTI    GERD (gastroesophageal reflux disease)    Pre-eclampsia    Past Surgical History:  Procedure Laterality Date   ABDOMINAL HYSTERECTOMY     total   CESAREAN SECTION  1987   TUBAL LIGATION  1987   Family History  Problem Relation Age of Onset   Diabetes Mother    Alzheimer's disease Mother    Seizures Mother    Heart disease Father    Breast cancer Neg Hx    Social History   Socioeconomic History   Marital status: Married    Spouse name: Not on file   Number of children: 2   Years of  education: Not on file   Highest education level: Not on file  Occupational History   Not on file  Tobacco Use   Smoking status: Never   Smokeless tobacco: Never  Substance and Sexual Activity   Alcohol use: No    Alcohol/week: 0.0 standard drinks   Drug use: No   Sexual activity: Not on file  Other Topics Concern   Not on file  Social History Narrative   Not on file   Social Determinants of Health   Financial Resource Strain: Not on file  Food Insecurity: Not on file  Transportation Needs: Not on file  Physical Activity: Not on file  Stress: Not on file  Social Connections: Not on file     Review of Systems  Constitutional:  Negative for appetite change and unexpected weight change.  HENT:  Negative for congestion and sinus pressure.   Respiratory:  Negative for cough, chest tightness and shortness of breath.   Cardiovascular:  Negative for chest pain, palpitations and leg swelling.  Gastrointestinal:  Negative for abdominal pain, diarrhea, nausea and vomiting.  Genitourinary:  Negative for difficulty urinating and dysuria.  Musculoskeletal:  Negative for joint swelling and myalgias.  Skin:  Negative for color change and rash.  Itching as outlined.  (Intermittent). Localized to face.   Neurological:  Negative for dizziness, light-headedness and headaches.  Psychiatric/Behavioral:  Negative for agitation and dysphoric mood.       Objective:     BP 106/70    Pulse 69    Temp 97.6 F (36.4 C)    Resp 16    Ht 5\' 2"  (1.575 m)    Wt 109 lb 9.6 oz (49.7 kg)    SpO2 99%    BMI 20.05 kg/m  Wt Readings from Last 3 Encounters:  05/14/21 109 lb 9.6 oz (49.7 kg)  02/06/21 110 lb (49.9 kg)  11/19/20 108 lb 12.8 oz (49.4 kg)    Physical Exam Vitals reviewed.  Constitutional:      General: She is not in acute distress.    Appearance: Normal appearance.  HENT:     Head: Normocephalic and atraumatic.     Right Ear: External ear normal.     Left Ear: External ear  normal.  Eyes:     General: No scleral icterus.       Right eye: No discharge.        Left eye: No discharge.     Conjunctiva/sclera: Conjunctivae normal.  Neck:     Thyroid: No thyromegaly.  Cardiovascular:     Rate and Rhythm: Normal rate and regular rhythm.  Pulmonary:     Effort: No respiratory distress.     Breath sounds: Normal breath sounds. No wheezing.  Abdominal:     General: Bowel sounds are normal.     Palpations: Abdomen is soft.     Tenderness: There is no abdominal tenderness.  Musculoskeletal:        General: No swelling or tenderness.     Cervical back: Neck supple. No tenderness.  Lymphadenopathy:     Cervical: No cervical adenopathy.  Skin:    Findings: No erythema or rash.  Neurological:     Mental Status: She is alert.  Psychiatric:        Mood and Affect: Mood normal.        Behavior: Behavior normal.     Outpatient Encounter Medications as of 05/14/2021  Medication Sig   aspirin EC 81 MG EC tablet Take 1 tablet (81 mg total) by mouth daily. Swallow whole.   famotidine (PEPCID) 20 MG tablet TAKE ONE TABLET BY MOUTH DAILY   metoprolol succinate (TOPROL-XL) 25 MG 24 hr tablet Take 0.5 tablets (12.5 mg total) by mouth daily.   multivitamin-iron-minerals-folic acid (CENTRUM) chewable tablet Chew 1 tablet by mouth daily.   telmisartan (MICARDIS) 40 MG tablet Take 1 tablet (40 mg total) by mouth daily.   timolol (TIMOPTIC) 0.25 % ophthalmic solution    No facility-administered encounter medications on file as of 05/14/2021.     Lab Results  Component Value Date   WBC 3.8 (L) 05/14/2021   HGB 12.7 05/14/2021   HCT 39.1 05/14/2021   PLT 236.0 05/14/2021   GLUCOSE 98 04/10/2021   CHOL 226 (H) 04/10/2021   TRIG 97.0 04/10/2021   HDL 72.90 04/10/2021   LDLDIRECT 119.7 05/30/2012   LDLCALC 134 (H) 04/10/2021   ALT 14 04/10/2021   AST 17 04/10/2021   NA 138 04/10/2021   K 4.8 04/10/2021   CL 102 04/10/2021   CREATININE 0.91 04/10/2021   BUN 13  04/10/2021   CO2 25 04/10/2021   TSH 2.07 04/10/2021    MM 3D SCREEN BREAST BILATERAL  Result Date: 06/02/2020 CLINICAL DATA:  Screening.  EXAM: DIGITAL SCREENING BILATERAL MAMMOGRAM WITH TOMOSYNTHESIS AND CAD TECHNIQUE: Bilateral screening digital craniocaudal and mediolateral oblique mammograms were obtained. Bilateral screening digital breast tomosynthesis was performed. The images were evaluated with computer-aided detection. COMPARISON:  Previous exam(s). ACR Breast Density Category c: The breast tissue is heterogeneously dense, which may obscure small masses. FINDINGS: There are no findings suspicious for malignancy. The images were evaluated with computer-aided detection. IMPRESSION: No mammographic evidence of malignancy. A result letter of this screening mammogram will be mailed directly to the patient. RECOMMENDATION: Screening mammogram in one year. (Code:SM-B-01Y) BI-RADS CATEGORY  1: Negative. Electronically Signed   By: Lovey Newcomer M.D.   On: 06/02/2020 17:14       Assessment & Plan:   Problem List Items Addressed This Visit     Fatty liver    Previous ultrasound revealed fatty liver.  Follow liver function tests.        GERD (gastroesophageal reflux disease)    Appears to be doing well on pepcid.  Discussed ent f/u.  Wants to monitor.       History of colonic polyps    Colonoscopy 06/2018.       Hypercholesterolemia    Follow lipid panel and liver function tests.        Relevant Orders   Lipid panel   Hepatic function panel   Hypertension, essential - Primary    Blood pressure doing better.  Continue metoprolol and micardis.  Follow pressures.  Follow metabolic panel.       Relevant Orders   Basic metabolic panel   Itching    Itching - face - intermittent.  Avoid facial cream.  Follow.  Call if persistent.        Lymphocytosis    Recheck cbc today.       Relevant Orders   CBC with Differential/Platelet (Completed)   CBC with Differential/Platelet   Neck  nodule    Saw ENT.  S/p biopsy.  No malignancy.  Persistent.  Discussed ENT follow up.  Wants to hold on f/u.  Wants to monitor.        Stress    Appears to be handling things relatively well.  Follow.          Einar Pheasant, MD

## 2021-05-15 ENCOUNTER — Encounter: Payer: Self-pay | Admitting: Internal Medicine

## 2021-05-15 DIAGNOSIS — L299 Pruritus, unspecified: Secondary | ICD-10-CM | POA: Insufficient documentation

## 2021-05-15 NOTE — Assessment & Plan Note (Signed)
Colonoscopy 06/2018.  

## 2021-05-15 NOTE — Assessment & Plan Note (Signed)
Recheck cbc today.   

## 2021-05-15 NOTE — Assessment & Plan Note (Signed)
Appears to be handling things relatively well.  Follow.  

## 2021-05-15 NOTE — Assessment & Plan Note (Signed)
Itching - face - intermittent.  Avoid facial cream.  Follow.  Call if persistent.

## 2021-05-15 NOTE — Assessment & Plan Note (Signed)
Saw ENT.  S/p biopsy.  No malignancy.  Persistent.  Discussed ENT follow up.  Wants to hold on f/u.  Wants to monitor.

## 2021-05-15 NOTE — Assessment & Plan Note (Signed)
Blood pressure doing better.  Continue metoprolol and micardis.  Follow pressures.  Follow metabolic panel.

## 2021-05-15 NOTE — Assessment & Plan Note (Signed)
Appears to be doing well on pepcid.  Discussed ent f/u.  Wants to monitor.

## 2021-05-15 NOTE — Assessment & Plan Note (Signed)
Follow lipid panel and liver function tests.

## 2021-05-15 NOTE — Assessment & Plan Note (Signed)
Previous ultrasound revealed fatty liver.  Follow liver function tests.   

## 2021-05-23 ENCOUNTER — Other Ambulatory Visit: Payer: Self-pay | Admitting: Internal Medicine

## 2021-06-23 ENCOUNTER — Ambulatory Visit
Admission: RE | Admit: 2021-06-23 | Discharge: 2021-06-23 | Disposition: A | Discharge status: Home / Self Care | Payer: 59 | Source: Ambulatory Visit | Attending: Internal Medicine | Admitting: Internal Medicine

## 2021-06-23 ENCOUNTER — Other Ambulatory Visit: Payer: Self-pay

## 2021-06-23 DIAGNOSIS — Z1231 Encounter for screening mammogram for malignant neoplasm of breast: Secondary | ICD-10-CM | POA: Insufficient documentation

## 2021-08-11 ENCOUNTER — Other Ambulatory Visit (INDEPENDENT_AMBULATORY_CARE_PROVIDER_SITE_OTHER): Payer: 59

## 2021-08-11 DIAGNOSIS — D72820 Lymphocytosis (symptomatic): Secondary | ICD-10-CM

## 2021-08-11 DIAGNOSIS — I10 Essential (primary) hypertension: Secondary | ICD-10-CM | POA: Diagnosis not present

## 2021-08-11 DIAGNOSIS — E7800 Pure hypercholesterolemia, unspecified: Secondary | ICD-10-CM

## 2021-08-11 LAB — CBC WITH DIFFERENTIAL/PLATELET
Basophils Absolute: 0 10*3/uL (ref 0.0–0.1)
Basophils Relative: 1 % (ref 0.0–3.0)
Eosinophils Absolute: 0.1 10*3/uL (ref 0.0–0.7)
Eosinophils Relative: 2.7 % (ref 0.0–5.0)
HCT: 39.1 % (ref 36.0–46.0)
Hemoglobin: 12.7 g/dL (ref 12.0–15.0)
Lymphocytes Relative: 54.6 % — ABNORMAL HIGH (ref 12.0–46.0)
Lymphs Abs: 2.2 10*3/uL (ref 0.7–4.0)
MCHC: 32.4 g/dL (ref 30.0–36.0)
MCV: 94.6 fl (ref 78.0–100.0)
Monocytes Absolute: 0.4 10*3/uL (ref 0.1–1.0)
Monocytes Relative: 9.1 % (ref 3.0–12.0)
Neutro Abs: 1.3 10*3/uL — ABNORMAL LOW (ref 1.4–7.7)
Neutrophils Relative %: 32.6 % — ABNORMAL LOW (ref 43.0–77.0)
Platelets: 228 10*3/uL (ref 150.0–400.0)
RBC: 4.14 Mil/uL (ref 3.87–5.11)
RDW: 12.7 % (ref 11.5–15.5)
WBC: 4.1 10*3/uL (ref 4.0–10.5)

## 2021-08-11 LAB — HEPATIC FUNCTION PANEL
ALT: 13 U/L (ref 0–35)
AST: 17 U/L (ref 0–37)
Albumin: 4.2 g/dL (ref 3.5–5.2)
Alkaline Phosphatase: 57 U/L (ref 39–117)
Bilirubin, Direct: 0.1 mg/dL (ref 0.0–0.3)
Total Bilirubin: 0.9 mg/dL (ref 0.2–1.2)
Total Protein: 7 g/dL (ref 6.0–8.3)

## 2021-08-11 LAB — BASIC METABOLIC PANEL
BUN: 18 mg/dL (ref 6–23)
CO2: 25 mEq/L (ref 19–32)
Calcium: 9.2 mg/dL (ref 8.4–10.5)
Chloride: 101 mEq/L (ref 96–112)
Creatinine, Ser: 1 mg/dL (ref 0.40–1.20)
GFR: 59.57 mL/min — ABNORMAL LOW (ref >60.00)
Glucose, Bld: 92 mg/dL (ref 70–99)
Potassium: 4.2 mEq/L (ref 3.5–5.1)
Sodium: 137 mEq/L (ref 135–145)

## 2021-08-11 LAB — LIPID PANEL
Cholesterol: 211 mg/dL — ABNORMAL HIGH (ref 0–200)
HDL: 73.5 mg/dL (ref >39.00)
LDL Cholesterol: 123 mg/dL — ABNORMAL HIGH (ref 0–99)
NonHDL: 137.54
Total CHOL/HDL Ratio: 3
Triglycerides: 72 mg/dL (ref 0.0–149.0)
VLDL: 14.4 mg/dL (ref 0.0–40.0)

## 2021-08-13 ENCOUNTER — Other Ambulatory Visit: Payer: Self-pay

## 2021-08-13 ENCOUNTER — Ambulatory Visit (INDEPENDENT_AMBULATORY_CARE_PROVIDER_SITE_OTHER): Payer: 59 | Admitting: Internal Medicine

## 2021-08-13 ENCOUNTER — Encounter: Payer: Self-pay | Admitting: Internal Medicine

## 2021-08-13 DIAGNOSIS — K760 Fatty (change of) liver, not elsewhere classified: Secondary | ICD-10-CM

## 2021-08-13 DIAGNOSIS — I10 Essential (primary) hypertension: Secondary | ICD-10-CM | POA: Diagnosis not present

## 2021-08-13 DIAGNOSIS — Z86010 Personal history of colonic polyps: Secondary | ICD-10-CM | POA: Diagnosis not present

## 2021-08-13 DIAGNOSIS — Z0000 Encounter for general adult medical examination without abnormal findings: Secondary | ICD-10-CM

## 2021-08-13 DIAGNOSIS — R221 Localized swelling, mass and lump, neck: Secondary | ICD-10-CM

## 2021-08-13 DIAGNOSIS — D72820 Lymphocytosis (symptomatic): Secondary | ICD-10-CM

## 2021-08-13 DIAGNOSIS — E7800 Pure hypercholesterolemia, unspecified: Secondary | ICD-10-CM | POA: Diagnosis not present

## 2021-08-13 DIAGNOSIS — F439 Reaction to severe stress, unspecified: Secondary | ICD-10-CM

## 2021-08-13 MED ORDER — TELMISARTAN 40 MG PO TABS: 40.0000 mg | ORAL_TABLET | Freq: Every day | ORAL | 1 refills | Status: DC

## 2021-08-13 MED ORDER — METOPROLOL SUCCINATE ER 25 MG PO TB24: 12.5000 mg | ORAL_TABLET | Freq: Every day | ORAL | 3 refills | Status: DC

## 2021-08-13 NOTE — Assessment & Plan Note (Signed)
Physical today 08/13/21.  PAP 08/13/21.  Mammogram 06/23/21 - Birads I.  Colonoscopy 06/2018 ?

## 2021-08-13 NOTE — Assessment & Plan Note (Addendum)
The 10-year ASCVD risk score (Arnett DK, et al., 2019) is: 6.3%   Values used to calculate the score:     Age: 66 years     Sex: Female     Is Non-Hispanic African American: Yes     Diabetic: No     Tobacco smoker: No     Systolic Blood Pressure: 881 mmHg     Is BP treated: Yes     HDL Cholesterol: 73.5 mg/dL     Total Cholesterol: 211 mg/dL  Low cholesterol diet and exercise.  Follow lipid panel.

## 2021-08-13 NOTE — Progress Notes (Signed)
Patient ID: Angel Taylor, female   DOB: 09-11-1955, 66 y.o.   MRN: [id number]   Subjective:    Patient ID: Angel Taylor, female    DOB: 01-Jul-1955, 66 y.o.   MRN: [id number]   Patient here for a scheduled follow up.   Chief Complaint  Patient presents with   Follow-up    Follow up for HTN   .   HPI Increased stress. Work.  Overall feels she is handling things relatively well.  Tries to stay active.  No chest pain or sob reported.  No abdominal pain.  Bowels moving.  Was scheduled for a physical today.  Wanted to change to f/u and do physical next visit. Blood pressures doing ok - systolic readings 280K.  Persistent nodule - jaw.  Request f/u ENT.    Past Medical History:  Diagnosis Date   Anemia    Chronic headaches    Frequent UTI    GERD (gastroesophageal reflux disease)    Pre-eclampsia    Past Surgical History:  Procedure Laterality Date   ABDOMINAL HYSTERECTOMY     total   CESAREAN SECTION  1987   TUBAL LIGATION  1987   Family History  Problem Relation Age of Onset   Diabetes Mother    Alzheimer's disease Mother    Seizures Mother    Heart disease Father    Breast cancer Neg Hx    Social History   Socioeconomic History   Marital status: Married    Spouse name: Not on file   Number of children: 2   Years of education: Not on file   Highest education level: Not on file  Occupational History   Not on file  Tobacco Use   Smoking status: Never   Smokeless tobacco: Never  Substance and Sexual Activity   Alcohol use: No    Alcohol/week: 0.0 standard drinks   Drug use: No   Sexual activity: Not on file  Other Topics Concern   Not on file  Social History Narrative   Not on file   Social Determinants of Health   Financial Resource Strain: Not on file  Food Insecurity: Not on file  Transportation Needs: Not on file  Physical Activity: Not on file  Stress: Not on file  Social Connections: Not on file     Review of Systems  Constitutional:  Negative  for appetite change and unexpected weight change.  HENT:  Negative for congestion and sinus pressure.   Respiratory:  Negative for cough, chest tightness and shortness of breath.   Cardiovascular:  Negative for chest pain, palpitations and leg swelling.  Gastrointestinal:  Negative for abdominal pain, diarrhea, nausea and vomiting.  Genitourinary:  Negative for difficulty urinating and dysuria.  Musculoskeletal:  Negative for joint swelling and myalgias.  Skin:  Negative for color change and rash.  Neurological:  Negative for dizziness, light-headedness and headaches.  Psychiatric/Behavioral:  Negative for agitation and dysphoric mood.       Objective:     BP 114/72 (BP Location: Left Arm, Patient Position: Sitting, Cuff Size: Small)   Pulse 60   Temp (!) 97.4 F (36.3 C) (Temporal)   Resp 12   Ht '5\' 2"'$  (1.575 m)   Wt 111 lb 9.6 oz (50.6 kg)   SpO2 98%   BMI 20.41 kg/m  Wt Readings from Last 3 Encounters:  08/21/21 112 lb (50.8 kg)  08/13/21 111 lb 9.6 oz (50.6 kg)  05/14/21 109 lb 9.6 oz (49.7 kg)  Physical Exam Vitals reviewed.  Constitutional:      General: She is not in acute distress.    Appearance: Normal appearance.  HENT:     Head: Normocephalic and atraumatic.     Right Ear: External ear normal.     Left Ear: External ear normal.  Eyes:     General: No scleral icterus.       Right eye: No discharge.        Left eye: No discharge.     Conjunctiva/sclera: Conjunctivae normal.  Neck:     Thyroid: No thyromegaly.     Comments: Nodule - angle of jaw. Nontender Cardiovascular:     Rate and Rhythm: Normal rate and regular rhythm.  Pulmonary:     Effort: No respiratory distress.     Breath sounds: Normal breath sounds. No wheezing.  Abdominal:     General: Bowel sounds are normal.     Palpations: Abdomen is soft.     Tenderness: There is no abdominal tenderness.  Musculoskeletal:        General: No swelling or tenderness.     Cervical back: Neck supple.  No tenderness.  Lymphadenopathy:     Cervical: No cervical adenopathy.  Skin:    Findings: No erythema or rash.  Neurological:     Mental Status: She is alert.  Psychiatric:        Mood and Affect: Mood normal.        Behavior: Behavior normal.     Outpatient Encounter Medications as of 08/13/2021  Medication Sig   aspirin EC 81 MG EC tablet Take 1 tablet (81 mg total) by mouth daily. Swallow whole.   famotidine (PEPCID) 20 MG tablet TAKE ONE TABLET BY MOUTH DAILY   metoprolol succinate (TOPROL-XL) 25 MG 24 hr tablet Take 0.5 tablets (12.5 mg total) by mouth daily.   multivitamin-iron-minerals-folic acid (CENTRUM) chewable tablet Chew 1 tablet by mouth daily.   telmisartan (MICARDIS) 40 MG tablet Take 1 tablet (40 mg total) by mouth daily.   timolol (TIMOPTIC) 0.25 % ophthalmic solution    No facility-administered encounter medications on file as of 08/13/2021.     Lab Results  Component Value Date   WBC 4.2 08/21/2021   HGB 12.9 08/21/2021   HCT 41.5 08/21/2021   PLT 276 08/21/2021   GLUCOSE 120 (H) 08/21/2021   CHOL 211 (H) 08/11/2021   TRIG 72.0 08/11/2021   HDL 73.50 08/11/2021   LDLDIRECT 119.7 05/30/2012   LDLCALC 123 (H) 08/11/2021   ALT 13 08/11/2021   AST 17 08/11/2021   NA 139 08/21/2021   K 4.4 08/21/2021   CL 106 08/21/2021   CREATININE 0.89 08/21/2021   BUN 13 08/21/2021   CO2 25 08/21/2021   TSH 2.07 04/10/2021    MM 3D SCREEN BREAST BILATERAL  Result Date: 06/24/2021 CLINICAL DATA:  Screening. EXAM: DIGITAL SCREENING BILATERAL MAMMOGRAM WITH TOMOSYNTHESIS AND CAD TECHNIQUE: Bilateral screening digital craniocaudal and mediolateral oblique mammograms were obtained. Bilateral screening digital breast tomosynthesis was performed. The images were evaluated with computer-aided detection. COMPARISON:  Previous exam(s). ACR Breast Density Category b: There are scattered areas of fibroglandular density. FINDINGS: There are no findings suspicious for malignancy.  IMPRESSION: No mammographic evidence of malignancy. A result letter of this screening mammogram will be mailed directly to the patient. RECOMMENDATION: Screening mammogram in one year. (Code:SM-B-01Y) BI-RADS CATEGORY  1: Negative. Electronically Signed   By: Abelardo Diesel M.D.   On: 06/24/2021 11:58  Assessment & Plan:   Problem List Items Addressed This Visit     Fatty liver    Previous ultrasound revealed fatty liver.  Follow liver function tests.         Health care maintenance    Physical today 08/13/21.  PAP 08/13/21.  Mammogram 06/23/21 - Birads I.  Colonoscopy 06/2018       History of colonic polyps    Colonoscopy 06/2018.        Hypercholesterolemia    The 10-year ASCVD risk score (Arnett DK, et al., 2019) is: 6.3%   Values used to calculate the score:     Age: 66 years     Sex: Female     Is Non-Hispanic African American: Yes     Diabetic: No     Tobacco smoker: No     Systolic Blood Pressure: 629 mmHg     Is BP treated: Yes     HDL Cholesterol: 73.5 mg/dL     Total Cholesterol: 211 mg/dL  Low cholesterol diet and exercise.  Follow lipid panel.        Relevant Orders   Hepatic function panel   Lipid panel   CBC with Differential/Platelet   Hypertension, essential    Blood pressure doing better.  Continue metoprolol and micardis.  Follow pressures.  Follow metabolic panel.        Relevant Orders   Basic metabolic panel   Lymphocytosis    Follow cbc.        Neck nodule    Saw ENT.  S/p biopsy.  No malignancy.  Persistent.  Request ENT follow up.        Relevant Orders   Ambulatory referral to ENT   Stress    Appears to be handling things relatively well.  Follow.           Einar Pheasant, MD

## 2021-08-21 ENCOUNTER — Emergency Department: Payer: 59

## 2021-08-21 ENCOUNTER — Emergency Department
Admission: EM | Admit: 2021-08-21 | Discharge: 2021-08-21 | Disposition: A | Discharge status: Home / Self Care | Payer: 59 | Attending: Emergency Medicine | Admitting: Emergency Medicine

## 2021-08-21 ENCOUNTER — Encounter: Payer: Self-pay | Admitting: Emergency Medicine

## 2021-08-21 ENCOUNTER — Other Ambulatory Visit: Payer: Self-pay

## 2021-08-21 DIAGNOSIS — R0789 Other chest pain: Secondary | ICD-10-CM

## 2021-08-21 DIAGNOSIS — I10 Essential (primary) hypertension: Secondary | ICD-10-CM | POA: Diagnosis not present

## 2021-08-21 LAB — BASIC METABOLIC PANEL
Anion gap: 8 (ref 5–15)
BUN: 13 mg/dL (ref 8–23)
CO2: 25 mmol/L (ref 22–32)
Calcium: 9.3 mg/dL (ref 8.9–10.3)
Chloride: 106 mmol/L (ref 98–111)
Creatinine, Ser: 0.89 mg/dL (ref 0.44–1.00)
GFR, Estimated: >60 mL/min (ref >60)
Glucose, Bld: 120 mg/dL — ABNORMAL HIGH (ref 70–99)
Potassium: 4.4 mmol/L (ref 3.5–5.1)
Sodium: 139 mmol/L (ref 135–145)

## 2021-08-21 LAB — CBC
HCT: 41.5 % (ref 36.0–46.0)
Hemoglobin: 12.9 g/dL (ref 12.0–15.0)
MCH: 30 pg (ref 26.0–34.0)
MCHC: 31.1 g/dL (ref 30.0–36.0)
MCV: 96.5 fL (ref 80.0–100.0)
Platelets: 276 10*3/uL (ref 150–400)
RBC: 4.3 MIL/uL (ref 3.87–5.11)
RDW: 12.6 % (ref 11.5–15.5)
WBC: 4.2 10*3/uL (ref 4.0–10.5)
nRBC: 0 % (ref 0.0–0.2)

## 2021-08-21 LAB — TROPONIN I (HIGH SENSITIVITY): Troponin I (High Sensitivity): 4 ng/L (ref <18)

## 2021-08-21 NOTE — ED Provider Notes (Signed)
[facility] Provider Note    Event Date/Time   First MD Initiated Contact with Patient 08/21/21 [phone]     (approximate)   History   Hypertension and Heartburn   HPI  Angel Taylor is a 66 y.o. female with history of hypertension and GERD who presents with elevated blood pressure readings over the last 5 days, initially in the 735H systolic then with readings as high as 160/90 today.  The patient states that last weekend she had a sinus infection and was taking cough medicine.  She was worried about interactions with her blood pressure medication so skipped a couple of doses of them and feels like her blood pressure has not been completely normal since that time although she has been taking them consistently for the last 5 days.  She also reports some lower chest discomfort over the last 1 to 2 days intermittently which is consistent with prior heartburn.  The patient denies associated shortness of breath, cough, lightheadedness, nausea, headache, or other acute symptoms.    Physical Exam   Triage Vital Signs: ED Triage Vitals  Enc Vitals Group     BP 08/21/21 0701 (!) 170/93     Pulse Rate 08/21/21 0701 65     Resp 08/21/21 0701 16     Temp 08/21/21 0701 98.1 F (36.7 C)     Temp Source 08/21/21 0701 Oral     SpO2 08/21/21 0701 100 %     Weight 08/21/21 0658 112 lb (50.8 kg)     Height 08/21/21 0658 '5\' 2"'$  (1.575 m)     Head Circumference --      Peak Flow --      Pain Score 08/21/21 0658 1     Pain Loc --      Pain Edu? --      Excl. in [place]? --     Most recent vital signs: Vitals:   08/21/21 0701  BP: (!) 170/93  Pulse: 65  Resp: 16  Temp: 98.1 F (36.7 C)  SpO2: 100%     General: Alert, well-appearing. CV:  Good peripheral perfusion.  Normal heart sounds. Resp:  Normal effort.  Lungs CTAB. Abd:  No distention.  Other:  No edema.   ED Results / Procedures / Treatments   Labs (all labs ordered are listed, but only abnormal results  are displayed) Labs Reviewed  BASIC METABOLIC PANEL - Abnormal; Notable for the following components:      Result Value   Glucose, Bld 120 (*)    All other components within normal limits  CBC  TROPONIN I (HIGH SENSITIVITY)  TROPONIN I (HIGH SENSITIVITY)     EKG  ED ECG REPORT I, Arta Silence, the attending physician, personally viewed and interpreted this ECG.  Date: 08/21/2021 EKG Time: 0701 Rate: 64 Rhythm: normal sinus rhythm QRS Axis: normal Intervals: normal ST/T Wave abnormalities: Nonspecific ST flattening Narrative Interpretation: Nonspecific abnormalities with no evidence of acute ischemia; no significant change when compared to EKG of 02/01/2020    RADIOLOGY  Chest x-ray: I independently viewed and interpreted the images; there is no focal consolidation or edema  PROCEDURES:  Critical Care performed: No  Procedures   MEDICATIONS ORDERED IN ED: Medications - No data to display   IMPRESSION / MDM / [place] / ED COURSE  I reviewed the triage vital signs and the nursing notes.  66 year old female with PMH as noted above presents with elevated blood pressures over the last few  days after skipping a few doses of her Micardis and metoprolol during a URI last week, as well as some mild chest discomfort which is consistent with prior heartburn.  I reviewed the past medical records.  Per the hospitalist discharge summary from 02/02/2020 the patient was admitted at that time with chest pain and elevated troponins.  She was seen by cardiology and had a nuclear stress test which was normal.  On exam the patient is well-appearing.  She is hypertensive with otherwise normal vital signs.  The physical exam is otherwise unremarkable.  EKG is nonischemic.  Chest x-ray shows no acute abnormality.  Differential diagnosis includes, but is not limited to, essential hypertension likely exacerbated by medication noncompliance.  Chest discomfort is consistent  with GERD.  I do not suspect ACS or other acute cardiac cause given the patient's previous negative work-up and atypical symptoms.  We will obtain basic labs and a single screening troponin given the duration of the symptoms.  If this is negative I anticipate discharge home.  The patient is on the cardiac monitor to evaluate for evidence of arrhythmia and/or significant heart rate changes.  ----------------------------------------- 8:32 AM on 08/21/2021 -----------------------------------------  Troponin is negative.  There is no indication for a repeat.  BMP shows normal electrolytes.  CBC is also normal.  At this time, the patient is stable for discharge home.  I counseled her on the results of the work-up and plan of care.  At this time, there is no evidence of hypertensive urgency or indication to acutely treat her blood pressure or change the dosage of her medication.  I recommend that she continue taking her blood pressure medications as prescribed, check her blood pressure several times over the next few days, and then touch base with her primary care doctor early next week if she continues to get elevated readings.  Return precautions given, and she expresses understanding.   FINAL CLINICAL IMPRESSION(S) / ED DIAGNOSES   Final diagnoses:  Hypertension, unspecified type  Chest discomfort     Rx / DC Orders   ED Discharge Orders     None        Note:  This document was prepared using Dragon voice recognition software and may include unintentional dictation errors.    Arta Silence, MD 08/21/21 [phone]

## 2021-08-21 NOTE — ED Triage Notes (Signed)
Pt to ED from home c/o heartburn and HTN for a couple days.  Denies SOB, n/v/d, or urinary changes.  Missed a couple doses of medications recently.  Pt A&Ox4, chest rise even and unlabored, skin WNL and in NAD at this time.

## 2021-08-21 NOTE — Discharge Instructions (Signed)
You should check your blood pressure a few more times that the weekend.  If it is still elevated early next week call your primary care doctor to discuss whether you may need a change in a dose of your blood pressure medication.  You can take up to 2 doses per day of the famotidine if needed for heartburn.  Return to the ER immediately for new, worsening, or persistent severe chest discomfort or pain, difficulty breathing, weakness or lightheadedness, severe headache, or blood pressure readings higher than 200 on the top number or 120 on the bottom number, or for any other new or worsening symptoms that concern you.

## 2021-08-23 ENCOUNTER — Encounter: Payer: Self-pay | Admitting: Internal Medicine

## 2021-08-23 NOTE — Assessment & Plan Note (Signed)
Colonoscopy 06/2018.  

## 2021-08-23 NOTE — Assessment & Plan Note (Signed)
Previous ultrasound revealed fatty liver.  Follow liver function tests.   

## 2021-08-23 NOTE — Assessment & Plan Note (Signed)
Follow cbc.  

## 2021-08-23 NOTE — Assessment & Plan Note (Signed)
Saw ENT.  S/p biopsy.  No malignancy.  Persistent.  Request ENT follow up.

## 2021-08-23 NOTE — Assessment & Plan Note (Signed)
Appears to be handling things relatively well.  Follow.  

## 2021-08-23 NOTE — Assessment & Plan Note (Signed)
Blood pressure doing better.  Continue metoprolol and micardis.  Follow pressures.  Follow metabolic panel.

## 2021-08-24 ENCOUNTER — Telehealth: Payer: Self-pay

## 2021-08-24 NOTE — Telephone Encounter (Signed)
Reviewed note.  Per note, blood pressures ok this weekend and increased when rushing around getting ready to leave town.  Agree with monitoring.  We can adjust the micardis dose, but if has been ok and just elevated today - can get more readings.  Any problems, let us know.  If any acute issue, when out of town, needs to be seen.

## 2021-08-24 NOTE — Telephone Encounter (Signed)
S/w pt -  Per pt she took all 3 b/p readings within 20 minutes of each other.  Pt advised not to take readings soo close together, she may be making herself nervous and it is not accurate. Per pt readings have been fine over weekend , this AM was getting ready for trip out of town and rushing around. Leaves tomorrow AM at 9am.  Per pt - no chest pain, S.O.B , etc. Pt states she feels fine.  Returns from trip Thursday evening.   Is scheduled for appointment with you 6/1  Pt advised to take periodic readings if possible while she is away and record for you.   Any other recommendations ?

## 2021-08-24 NOTE — Telephone Encounter (Signed)
Patient states her blood pressure was elevated last week and she went to the ED to get checked.  Patient states they said she is ok, but if it's still elevated at the beginning of this week, she should see her PCP because her medication may need to be adjusted.  Patient states she took her blood pressure three times this morning with the following readings:  153/91 (pulse 55), 147/89 (pulse 56), 173/97 (pulse 57).  Patient states she took the first two blood pressure readings close together, but there was some time between the second and third reading.  Patient states she is concerned about her blood pressure.  Please call.  Patient also states that she is leaving town tomorrow and will return on Thursday evening.  Patient has been scheduled for first available with Dr. Einar Pheasant (09/03/2021).

## 2021-08-25 ENCOUNTER — Encounter: Payer: Self-pay | Admitting: Family

## 2021-08-25 ENCOUNTER — Ambulatory Visit: Payer: 59 | Admitting: Family

## 2021-08-25 DIAGNOSIS — I10 Essential (primary) hypertension: Secondary | ICD-10-CM

## 2021-08-25 NOTE — Assessment & Plan Note (Signed)
Recent escalation in blood pressure.  Reviewed emergency room visit with patient today.  Blood pressure today remains slightly elevated above her previous baseline per chart review.  Discussed recent URI and that she did miss doses of her antihypertensives.  Blood pressure does appear to be trending down and we discussed continued surveillance  versus increasing telmisartan.  Patient was more comfortable with continue monitor blood pressure.  She  declines increasing telmisartan at this time.  She is upcoming appointment with her PCP and she would like to discuss with Dr. Nicki Reaper as well.  Counseled her on importance of low-sodium diet, adequate exercise provided with information regarding DASH diet. Continue metoprolol succinate 12.5 mg, telmisartan 40 mg.  She will let me know of any concerns

## 2021-08-25 NOTE — Patient Instructions (Signed)
As discussed today, I think is reasonable to continue watching and remain vigilant.  Please bring your blood pressure machine with you to your next appointment with Dr. Nicki Reaper.  Please call me with any concerns in between or if blood pressure starts to escalate.  I think it is reasonable given your current regimen more time.  Please follow a low-sodium diet as discussed as well  Managing Your Hypertension Hypertension, also called high blood pressure, is when the force of the blood pressing against the walls of the arteries is too strong. Arteries are blood vessels that carry blood from your heart throughout your body. Hypertension forces the heart to work harder to pump blood and may cause the arteries to become narrow or stiff. Understanding blood pressure readings A blood pressure reading includes a higher number over a lower number: The first, or top, number is called the systolic pressure. It is a measure of the pressure in your arteries as your heart beats. The second, or bottom number, is called the diastolic pressure. It is a measure of the pressure in your arteries as the heart relaxes. For most people, a normal blood pressure is below 120/80. Your personal target blood pressure may vary depending on your medical conditions, your age, and other factors. Blood pressure is classified into four stages. Based on your blood pressure reading, your health care provider may use the following stages to determine what type of treatment you need, if any. Systolic pressure and diastolic pressure are measured in a unit called millimeters of mercury (mmHg). Normal Systolic pressure: below 390. Diastolic pressure: below 80. Elevated Systolic pressure: 300-923. Diastolic pressure: below 80. Hypertension stage 1 Systolic pressure: 300-762. Diastolic pressure: 26-33. Hypertension stage 2 Systolic pressure: 354 or above. Diastolic pressure: 90 or above. How can this condition affect me? Managing your  hypertension is very important. Over time, hypertension can damage the arteries and decrease blood flow to parts of the body, including the brain, heart, and kidneys. Having untreated or uncontrolled hypertension can lead to: A heart attack. A stroke. A weakened blood vessel (aneurysm). Heart failure. Kidney damage. Eye damage. Memory and concentration problems. Vascular dementia. What actions can I take to manage this condition? Hypertension can be managed by making lifestyle changes and possibly by taking medicines. Your health care provider will help you make a plan to bring your blood pressure within a normal range. You may be referred for counseling on a healthy diet and physical activity. Nutrition  Eat a diet that is high in fiber and potassium, and low in salt (sodium), added sugar, and fat. An example eating plan is called the DASH diet. DASH stands for Dietary Approaches to Stop Hypertension. To eat this way: Eat plenty of fresh fruits and vegetables. Try to fill one-half of your plate at each meal with fruits and vegetables. Eat whole grains, such as whole-wheat pasta, brown rice, or whole-grain bread. Fill about one-fourth of your plate with whole grains. Eat low-fat dairy products. Avoid fatty cuts of meat, processed or cured meats, and poultry with skin. Fill about one-fourth of your plate with lean proteins such as fish, chicken without skin, beans, eggs, and tofu. Avoid pre-made and processed foods. These tend to be higher in sodium, added sugar, and fat. Reduce your daily sodium intake. Many people with hypertension should eat less than 1,500 mg of sodium a day. Lifestyle  Work with your health care provider to maintain a healthy body weight or to lose weight. Ask what an ideal  weight is for you. Get at least 30 minutes of exercise that causes your heart to beat faster (aerobic exercise) most days of the week. Activities may include walking, swimming, or biking. Include  exercise to strengthen your muscles (resistance exercise), such as weight lifting, as part of your weekly exercise routine. Try to do these types of exercises for 30 minutes at least 3 days a week. Do not use any products that contain nicotine or tobacco. These products include cigarettes, chewing tobacco, and vaping devices, such as e-cigarettes. If you need help quitting, ask your health care provider. Control any long-term (chronic) conditions you have, such as high cholesterol or diabetes. Identify your sources of stress and find ways to manage stress. This may include meditation, deep breathing, or making time for fun activities. Alcohol use Do not drink alcohol if: Your health care provider tells you not to drink. You are pregnant, may be pregnant, or are planning to become pregnant. If you drink alcohol: Limit how much you have to: 0-1 drink a day for women. 0-2 drinks a day for men. Know how much alcohol is in your drink. In the [place] one drink equals one 12 oz bottle of beer (355 mL), one 5 oz glass of wine (148 mL), or one 1 oz glass of hard liquor (44 mL). Medicines Your health care provider may prescribe medicine if lifestyle changes are not enough to get your blood pressure under control and if: Your systolic blood pressure is 130 or higher. Your diastolic blood pressure is 80 or higher. Take medicines only as told by your health care provider. Follow the directions carefully. Blood pressure medicines must be taken as told by your health care provider. The medicine does not work as well when you skip doses. Skipping doses also puts you at risk for problems. Monitoring Before you monitor your blood pressure: Do not smoke, drink caffeinated beverages, or exercise within 30 minutes before taking a measurement. Use the bathroom and empty your bladder (urinate). Sit quietly for at least 5 minutes before taking measurements. Monitor your blood pressure at home as told by your health  care provider. To do this: Sit with your back straight and supported. Place your feet flat on the floor. Do not cross your legs. Support your arm on a flat surface, such as a table. Make sure your upper arm is at heart level. Each time you measure, take two or three readings one minute apart and record the results. You may also need to have your blood pressure checked regularly by your health care provider. General information Talk with your health care provider about your diet, exercise habits, and other lifestyle factors that may be contributing to hypertension. Review all the medicines you take with your health care provider because there may be side effects or interactions. Keep all follow-up visits. Your health care provider can help you create and adjust your plan for managing your high blood pressure. Where to find more information National Heart, Lung, and Blood Institute: https://wilson-eaton.com/ American Heart Association: www.heart.org Contact a health care provider if: You think you are having a reaction to medicines you have taken. You have repeated (recurrent) headaches. You feel dizzy. You have swelling in your ankles. You have trouble with your vision. Get help right away if: You develop a severe headache or confusion. You have unusual weakness or numbness, or you feel faint. You have severe pain in your chest or abdomen. You vomit repeatedly. You have trouble breathing. These symptoms may be  an emergency. Get help right away. Call 911. Do not wait to see if the symptoms will go away. Do not drive yourself to the hospital. Summary Hypertension is when the force of blood pumping through your arteries is too strong. If this condition is not controlled, it may put you at risk for serious complications. Your personal target blood pressure may vary depending on your medical conditions, your age, and other factors. For most people, a normal blood pressure is less than  120/80. Hypertension is managed by lifestyle changes, medicines, or both. Lifestyle changes to help manage hypertension include losing weight, eating a healthy, low-sodium diet, exercising more, stopping smoking, and limiting alcohol. This information is not intended to replace advice given to you by your health care provider. Make sure you discuss any questions you have with your health care provider. Document Revised: 12/04/2020 Document Reviewed: 12/04/2020 Elsevier Patient Education  [place].

## 2021-08-25 NOTE — Progress Notes (Signed)
Subjective:    Patient ID: Angel Taylor, female    DOB: 06/14/1955, 66 y.o.   MRN: [id number]  CC: Angel Taylor is a 66 y.o. female who presents today for an acute visit.    HPI: Here today for due to recent escalation of blood pressure.   She is compliant with metoprolol succinate 12.5 mg, telmisartan 40 mg She reports BP elevated 1 week ago on home readings. BP at home 134/79, 127/81, 123/75. She reports rushing to go to work with back to back readings 143/91, 147/89 during that time as well.  Onset coincides with URI . She was taking cloricidin.She wasn't taking decongestant.  She also notes that she missed couple of doses micardis and metoprolol   No CP, sob, palpitations,  nausea, vomiting.  She does endorse occasional self limiting dizziness.  No syncopal episodes. No increased sodium. No weight changes. No nsaid use.   She is compliant with pepcid '20mg'$  once per day, varying times. She has slight epigastric burning which resolves when she takes pepcid ac  Seen 08/21/21 at [facility] ED as she was concerned for elevated blood pressure and epigastric burning. Previously 08/16/21 at urgent care for sinus congestion at [facility] in [place] ( unable to see notes)   Troponin 4 CXR no acute cardiopulmonary disease HISTORY:  Past Medical History:  Diagnosis Date   Anemia    Chronic headaches    Frequent UTI    GERD (gastroesophageal reflux disease)    Pre-eclampsia    Past Surgical History:  Procedure Laterality Date   ABDOMINAL HYSTERECTOMY     total   CESAREAN SECTION  1987   TUBAL LIGATION  1987   Family History  Problem Relation Age of Onset   Diabetes Mother    Alzheimer's disease Mother    Seizures Mother    Heart disease Father    Breast cancer Neg Hx     Allergies: Bactrim [sulfamethoxazole-trimethoprim], Macrobid [nitrofurantoin macrocrystal], Nitrofurantoin, and Sulfa antibiotics Current Outpatient Medications on File Prior to Visit  Medication Sig Dispense  Refill   aspirin EC 81 MG EC tablet Take 1 tablet (81 mg total) by mouth daily. Swallow whole. 30 tablet 11   famotidine (PEPCID) 20 MG tablet TAKE ONE TABLET BY MOUTH DAILY 90 tablet 2   metoprolol succinate (TOPROL-XL) 25 MG 24 hr tablet Take 0.5 tablets (12.5 mg total) by mouth daily. 15 tablet 3   multivitamin-iron-minerals-folic acid (CENTRUM) chewable tablet Chew 1 tablet by mouth daily.     telmisartan (MICARDIS) 40 MG tablet Take 1 tablet (40 mg total) by mouth daily. 90 tablet 1   timolol (TIMOPTIC) 0.25 % ophthalmic solution      No current facility-administered medications on file prior to visit.    Social History   Tobacco Use   Smoking status: Never   Smokeless tobacco: Never  Substance Use Topics   Alcohol use: No    Alcohol/week: 0.0 standard drinks   Drug use: No    Review of Systems  Constitutional:  Negative for chills and fever.  Respiratory:  Negative for cough.   Cardiovascular:  Negative for chest pain and palpitations.  Gastrointestinal:  Negative for nausea and vomiting.     Objective:    BP 138/85   Pulse (!) 59   Ht '5\' 2"'$  (1.575 m)   Wt 110 lb 12.8 oz (50.3 kg)   SpO2 98%   BMI 20.27 kg/m   BP Readings from Last 3 Encounters:  08/25/21 138/85  08/21/21 (!) 170/93  08/13/21 114/72   Wt Readings from Last 3 Encounters:  08/25/21 110 lb 12.8 oz (50.3 kg)  08/21/21 112 lb (50.8 kg)  08/13/21 111 lb 9.6 oz (50.6 kg)     Physical Exam Vitals reviewed.  Constitutional:      Appearance: She is well-developed.  Eyes:     Conjunctiva/sclera: Conjunctivae normal.  Cardiovascular:     Rate and Rhythm: Normal rate and regular rhythm.     Pulses: Normal pulses.     Heart sounds: Normal heart sounds.  Pulmonary:     Effort: Pulmonary effort is normal.     Breath sounds: Normal breath sounds. No wheezing, rhonchi or rales.  Skin:    General: Skin is warm and dry.  Neurological:     Mental Status: She is alert.  Psychiatric:        Speech:  Speech normal.        Behavior: Behavior normal.        Thought Content: Thought content normal.       Assessment & Plan:   Problem List Items Addressed This Visit       Cardiovascular and Mediastinum   Hypertension, essential    Recent escalation in blood pressure.  Reviewed emergency room visit with patient today.  Blood pressure today remains slightly elevated above her previous baseline per chart review.  Discussed recent URI and that she did miss doses of her antihypertensives.  Blood pressure does appear to be trending down and we discussed continued surveillance  versus increasing telmisartan.  Patient was more comfortable with continue monitor blood pressure.  She  declines increasing telmisartan at this time.  She is upcoming appointment with her PCP and she would like to discuss with Dr. Nicki Reaper as well.  Counseled her on importance of low-sodium diet, adequate exercise provided with information regarding DASH diet. Continue metoprolol succinate 12.5 mg, telmisartan 40 mg.  She will let me know of any concerns          I am having Baruch Goldmann. Hefter maintain her multivitamin-iron-minerals-folic acid, aspirin EC, timolol, famotidine, metoprolol succinate, and telmisartan.   No orders of the defined types were placed in this encounter.   Return precautions given.   Risks, benefits, and alternatives of the medications and treatment plan prescribed today were discussed, and patient expressed understanding.   Education regarding symptom management and diagnosis given to patient on AVS.  Continue to follow with Einar Pheasant, MD for routine health maintenance.   Angel Taylor and I agreed with plan.   Mable Paris, FNP

## 2021-09-03 ENCOUNTER — Ambulatory Visit: Payer: 59 | Admitting: Internal Medicine

## 2021-09-04 ENCOUNTER — Encounter: Payer: Self-pay | Admitting: Internal Medicine

## 2021-09-04 ENCOUNTER — Ambulatory Visit: Payer: 59 | Admitting: Internal Medicine

## 2021-09-04 DIAGNOSIS — I10 Essential (primary) hypertension: Secondary | ICD-10-CM | POA: Diagnosis not present

## 2021-09-04 DIAGNOSIS — E7800 Pure hypercholesterolemia, unspecified: Secondary | ICD-10-CM | POA: Diagnosis not present

## 2021-09-04 DIAGNOSIS — K760 Fatty (change of) liver, not elsewhere classified: Secondary | ICD-10-CM | POA: Diagnosis not present

## 2021-09-04 DIAGNOSIS — R221 Localized swelling, mass and lump, neck: Secondary | ICD-10-CM

## 2021-09-04 DIAGNOSIS — F439 Reaction to severe stress, unspecified: Secondary | ICD-10-CM

## 2021-09-04 MED ORDER — TELMISARTAN 80 MG PO TABS: 80.0000 mg | ORAL_TABLET | Freq: Every day | ORAL | 2 refills | Status: DC

## 2021-09-04 NOTE — Progress Notes (Signed)
Patient ID: June Rode, female   DOB: 1955/12/09, 66 y.o.   MRN: [id number]   Subjective:    Patient ID: [name], female    DOB: 02-24-1956, 66 y.o.   MRN: [id number]   Patient here for a scheduled follow up.   Chief Complaint  Patient presents with   Follow-up   Hypertension   .   HPI Evaluated 08/21/21 - ER - elevated blood pressure and epigastric burning.  Also recently had URI.  Feeling better.  No increased cough or congestion.  No chest pain or sob.  No acid reflux or abdominal pain.  Taking metoprolol and telmisartan.  Feels is elevated today because was line dancing prior to coming to the clinic.  Handling stress.     Past Medical History:  Diagnosis Date   Anemia    Chronic headaches    Frequent UTI    GERD (gastroesophageal reflux disease)    Pre-eclampsia    Past Surgical History:  Procedure Laterality Date   ABDOMINAL HYSTERECTOMY     total   CESAREAN SECTION  1987   TUBAL LIGATION  1987   Family History  Problem Relation Age of Onset   Diabetes Mother    Alzheimer's disease Mother    Seizures Mother    Heart disease Father    Breast cancer Neg Hx    Social History   Socioeconomic History   Marital status: Married    Spouse name: Not on file   Number of children: 2   Years of education: Not on file   Highest education level: Not on file  Occupational History   Not on file  Tobacco Use   Smoking status: Never   Smokeless tobacco: Never  Substance and Sexual Activity   Alcohol use: No    Alcohol/week: 0.0 standard drinks   Drug use: No   Sexual activity: Not on file  Other Topics Concern   Not on file  Social History Narrative   Not on file   Social Determinants of Health   Financial Resource Strain: Not on file  Food Insecurity: Not on file  Transportation Needs: Not on file  Physical Activity: Not on file  Stress: Not on file  Social Connections: Not on file     Review of Systems  Constitutional:  Negative for appetite  change and unexpected weight change.  HENT:  Negative for congestion and sinus pressure.   Respiratory:  Negative for cough, chest tightness and shortness of breath.   Cardiovascular:  Negative for chest pain, palpitations and leg swelling.  Gastrointestinal:  Negative for abdominal pain, diarrhea, nausea and vomiting.  Genitourinary:  Negative for difficulty urinating and dysuria.  Musculoskeletal:  Negative for joint swelling and myalgias.  Skin:  Negative for color change and rash.  Neurological:  Negative for dizziness, light-headedness and headaches.  Psychiatric/Behavioral:  Negative for agitation and dysphoric mood.       Objective:     BP (!) 142/90 (BP Location: Left Arm, Patient Position: Sitting, Cuff Size: Small)   Pulse 76   Temp 97.7 F (36.5 C) (Temporal)   Resp 16   Ht '5\' 2"'$  (1.575 m)   Wt 111 lb 12.8 oz (50.7 kg)   SpO2 98%   BMI 20.45 kg/m  Wt Readings from Last 3 Encounters:  09/04/21 111 lb 12.8 oz (50.7 kg)  08/25/21 110 lb 12.8 oz (50.3 kg)  08/21/21 112 lb (50.8 kg)    Physical Exam Vitals reviewed.  Constitutional:  General: She is not in acute distress.    Appearance: Normal appearance.  HENT:     Head: Normocephalic and atraumatic.     Right Ear: External ear normal.     Left Ear: External ear normal.  Eyes:     General: No scleral icterus.       Right eye: No discharge.        Left eye: No discharge.     Conjunctiva/sclera: Conjunctivae normal.  Neck:     Thyroid: No thyromegaly.  Cardiovascular:     Rate and Rhythm: Normal rate and regular rhythm.  Pulmonary:     Effort: No respiratory distress.     Breath sounds: Normal breath sounds. No wheezing.  Abdominal:     General: Bowel sounds are normal.     Palpations: Abdomen is soft.     Tenderness: There is no abdominal tenderness.  Musculoskeletal:        General: No swelling or tenderness.     Cervical back: Neck supple. No tenderness.  Lymphadenopathy:     Cervical: No  cervical adenopathy.  Skin:    Findings: No erythema or rash.  Neurological:     Mental Status: She is alert.  Psychiatric:        Mood and Affect: Mood normal.        Behavior: Behavior normal.     Outpatient Encounter Medications as of 09/04/2021  Medication Sig   aspirin EC 81 MG EC tablet Take 1 tablet (81 mg total) by mouth daily. Swallow whole.   famotidine (PEPCID) 20 MG tablet TAKE ONE TABLET BY MOUTH DAILY   metoprolol succinate (TOPROL-XL) 25 MG 24 hr tablet Take 0.5 tablets (12.5 mg total) by mouth daily.   multivitamin-iron-minerals-folic acid (CENTRUM) chewable tablet Chew 1 tablet by mouth daily.   telmisartan (MICARDIS) 80 MG tablet Take 1 tablet (80 mg total) by mouth daily.   timolol (TIMOPTIC) 0.25 % ophthalmic solution    [DISCONTINUED] telmisartan (MICARDIS) 40 MG tablet Take 1 tablet (40 mg total) by mouth daily.   No facility-administered encounter medications on file as of 09/04/2021.     Lab Results  Component Value Date   WBC 4.2 08/21/2021   HGB 12.9 08/21/2021   HCT 41.5 08/21/2021   PLT 276 08/21/2021   GLUCOSE 120 (H) 08/21/2021   CHOL 211 (H) 08/11/2021   TRIG 72.0 08/11/2021   HDL 73.50 08/11/2021   LDLDIRECT 119.7 05/30/2012   LDLCALC 123 (H) 08/11/2021   ALT 13 08/11/2021   AST 17 08/11/2021   NA 139 08/21/2021   K 4.4 08/21/2021   CL 106 08/21/2021   CREATININE 0.89 08/21/2021   BUN 13 08/21/2021   CO2 25 08/21/2021   TSH 2.07 04/10/2021    DG Chest 2 View  Result Date: 08/21/2021 CLINICAL DATA:  66 year old female with history of chest pain. EXAM: CHEST - 2 VIEW COMPARISON:  Chest x-ray 01/31/2020. FINDINGS: Lung volumes are normal. No consolidative airspace disease. No pleural effusions. No pneumothorax. No pulmonary nodule or mass noted. Pulmonary vasculature and the cardiomediastinal silhouette are within normal limits. IMPRESSION: No radiographic evidence of acute cardiopulmonary disease. Electronically Signed   By: Vinnie Langton  M.D.   On: 08/21/2021 07:19       Assessment & Plan:   Problem List Items Addressed This Visit     Fatty liver    Previous ultrasound revealed fatty liver.  Follow liver function tests.         Hypercholesterolemia  The 10-year ASCVD risk score (Arnett DK, et al., 2019) is: 10.7%   Values used to calculate the score:     Age: 23 years     Sex: Female     Is Non-Hispanic African American: Yes     Diabetic: No     Tobacco smoker: No     Systolic Blood Pressure: 793 mmHg     Is BP treated: Yes     HDL Cholesterol: 73.5 mg/dL     Total Cholesterol: 211 mg/dL  Low cholesterol diet and exercise.  Follow lipid panel.        Relevant Medications   telmisartan (MICARDIS) 80 MG tablet   Hypertension, essential    Blood pressure remaining a little elevated.  Discussed goal blood pressure.  Increase telmisartan to '80mg'$  q day.  Follow pressures.  Follow metabolic panel.        Relevant Medications   telmisartan (MICARDIS) 80 MG tablet   Neck nodule    Saw ENT.  S/p biopsy.  No malignancy.  Persistent.  Request ENT follow up. Plans to call for f/u.        Stress    Appears to be handling things relatively well.  Follow.           Einar Pheasant, MD

## 2021-09-06 ENCOUNTER — Encounter: Payer: Self-pay | Admitting: Internal Medicine

## 2021-09-06 NOTE — Assessment & Plan Note (Signed)
Saw ENT.  S/p biopsy.  No malignancy.  Persistent.  Request ENT follow up. Plans to call for f/u.

## 2021-09-06 NOTE — Assessment & Plan Note (Signed)
The 10-year ASCVD risk score (Arnett DK, et al., 2019) is: 10.7%   Values used to calculate the score:     Age: 66 years     Sex: Female     Is Non-Hispanic African American: Yes     Diabetic: No     Tobacco smoker: No     Systolic Blood Pressure: 594 mmHg     Is BP treated: Yes     HDL Cholesterol: 73.5 mg/dL     Total Cholesterol: 211 mg/dL  Low cholesterol diet and exercise.  Follow lipid panel.

## 2021-09-06 NOTE — Assessment & Plan Note (Signed)
Previous ultrasound revealed fatty liver.  Follow liver function tests.   

## 2021-09-06 NOTE — Assessment & Plan Note (Signed)
Appears to be handling things relatively well.  Follow.  

## 2021-09-06 NOTE — Assessment & Plan Note (Signed)
Blood pressure remaining a little elevated.  Discussed goal blood pressure.  Increase telmisartan to '80mg'$  q day.  Follow pressures.  Follow metabolic panel.

## 2021-10-04 ENCOUNTER — Other Ambulatory Visit: Payer: Self-pay | Admitting: Internal Medicine

## 2021-10-18 ENCOUNTER — Other Ambulatory Visit: Payer: Self-pay | Admitting: Internal Medicine

## 2021-11-02 ENCOUNTER — Ambulatory Visit: Payer: 59 | Admitting: Internal Medicine

## 2021-11-02 ENCOUNTER — Encounter: Payer: Self-pay | Admitting: Internal Medicine

## 2021-11-02 DIAGNOSIS — K760 Fatty (change of) liver, not elsewhere classified: Secondary | ICD-10-CM | POA: Diagnosis not present

## 2021-11-02 DIAGNOSIS — E7800 Pure hypercholesterolemia, unspecified: Secondary | ICD-10-CM

## 2021-11-02 DIAGNOSIS — Z86010 Personal history of colonic polyps: Secondary | ICD-10-CM | POA: Diagnosis not present

## 2021-11-02 DIAGNOSIS — I10 Essential (primary) hypertension: Secondary | ICD-10-CM

## 2021-11-02 DIAGNOSIS — K219 Gastro-esophageal reflux disease without esophagitis: Secondary | ICD-10-CM

## 2021-11-02 DIAGNOSIS — R221 Localized swelling, mass and lump, neck: Secondary | ICD-10-CM

## 2021-11-02 DIAGNOSIS — F439 Reaction to severe stress, unspecified: Secondary | ICD-10-CM

## 2021-11-02 MED ORDER — FAMOTIDINE 20 MG PO TABS: 20.0000 mg | ORAL_TABLET | Freq: Every day | ORAL | 2 refills | Status: DC

## 2021-11-02 NOTE — Progress Notes (Unsigned)
Patient ID: Temitope Flammer, female   DOB: 1955/12/09, 66 y.o.   MRN: [id number]   Subjective:    Patient ID: [name], female    DOB: Mar 01, 1956, 66 y.o.   MRN: [id number]   Patient here for a scheduled follow up.   Chief Complaint  Patient presents with   Hypertension   .   Hypertension Pertinent negatives include no chest pain, headaches, palpitations or shortness of breath.   Increased micardis to '80mg'$  last visit.  She is tolerating the medication. Overall appears to be doing better.  No chest pain reported.  Does report some occasional acid reflux.  Takes pepcid prn.  Discussed taking daily for a set period of time to see if symptoms would subside.  No nausea or vomiting.  Bowels moving.     Past Medical History:  Diagnosis Date   Anemia    Chronic headaches    Frequent UTI    GERD (gastroesophageal reflux disease)    Pre-eclampsia    Past Surgical History:  Procedure Laterality Date   ABDOMINAL HYSTERECTOMY     total   CESAREAN SECTION  1987   TUBAL LIGATION  1987   Family History  Problem Relation Age of Onset   Diabetes Mother    Alzheimer's disease Mother    Seizures Mother    Heart disease Father    Breast cancer Neg Hx    Social History   Socioeconomic History   Marital status: Married    Spouse name: Not on file   Number of children: 2   Years of education: Not on file   Highest education level: Not on file  Occupational History   Not on file  Tobacco Use   Smoking status: Never   Smokeless tobacco: Never  Substance and Sexual Activity   Alcohol use: No    Alcohol/week: 0.0 standard drinks of alcohol   Drug use: No   Sexual activity: Not on file  Other Topics Concern   Not on file  Social History Narrative   Not on file   Social Determinants of Health   Financial Resource Strain: Not on file  Food Insecurity: Not on file  Transportation Needs: Not on file  Physical Activity: Not on file  Stress: Not on file  Social Connections: Not  on file     Review of Systems  Constitutional:  Negative for appetite change and unexpected weight change.  HENT:  Negative for sinus pressure.   Respiratory:  Negative for cough, chest tightness and shortness of breath.   Cardiovascular:  Negative for chest pain, palpitations and leg swelling.  Gastrointestinal:  Negative for abdominal pain, diarrhea, nausea and vomiting.  Genitourinary:  Negative for difficulty urinating and dysuria.  Musculoskeletal:  Negative for joint swelling and myalgias.  Skin:  Negative for color change and rash.  Neurological:  Negative for dizziness, light-headedness and headaches.  Psychiatric/Behavioral:  Negative for agitation and dysphoric mood.        Objective:     BP 130/78 (BP Location: Left Arm, Patient Position: Sitting, Cuff Size: Small)   Pulse 61   Temp 98.2 F (36.8 C) (Temporal)   Resp 17   Ht '5\' 2"'$  (1.575 m)   Wt 110 lb 6.4 oz (50.1 kg)   SpO2 99%   BMI 20.19 kg/m  Wt Readings from Last 3 Encounters:  11/02/21 110 lb 6.4 oz (50.1 kg)  09/04/21 111 lb 12.8 oz (50.7 kg)  08/25/21 110 lb 12.8 oz (50.3 kg)  Physical Exam Vitals reviewed.  Constitutional:      General: She is not in acute distress.    Appearance: Normal appearance.  HENT:     Head: Normocephalic and atraumatic.     Right Ear: External ear normal.     Left Ear: External ear normal.  Eyes:     General: No scleral icterus.       Right eye: No discharge.        Left eye: No discharge.     Conjunctiva/sclera: Conjunctivae normal.  Neck:     Thyroid: No thyromegaly.  Cardiovascular:     Rate and Rhythm: Normal rate and regular rhythm.  Pulmonary:     Effort: No respiratory distress.     Breath sounds: Normal breath sounds. No wheezing.  Abdominal:     General: Bowel sounds are normal.     Palpations: Abdomen is soft.     Tenderness: There is no abdominal tenderness.  Musculoskeletal:        General: No swelling or tenderness.     Cervical back: Neck  supple. No tenderness.  Lymphadenopathy:     Cervical: No cervical adenopathy.  Skin:    Findings: No erythema or rash.  Neurological:     Mental Status: She is alert.  Psychiatric:        Mood and Affect: Mood normal.        Behavior: Behavior normal.      Outpatient Encounter Medications as of 11/02/2021  Medication Sig   aspirin EC 81 MG EC tablet Take 1 tablet (81 mg total) by mouth daily. Swallow whole.   metoprolol succinate (TOPROL-XL) 25 MG 24 hr tablet TAKE 1/2 TABLET BY MOUTH DAILY   multivitamin-iron-minerals-folic acid (CENTRUM) chewable tablet Chew 1 tablet by mouth daily.   telmisartan (MICARDIS) 80 MG tablet TAKE 1 TABLET BY MOUTH DAILY   timolol (TIMOPTIC) 0.25 % ophthalmic solution    [DISCONTINUED] famotidine (PEPCID) 20 MG tablet TAKE ONE TABLET BY MOUTH DAILY   famotidine (PEPCID) 20 MG tablet Take 1 tablet (20 mg total) by mouth daily.   No facility-administered encounter medications on file as of 11/02/2021.     Lab Results  Component Value Date   WBC 4.2 08/21/2021   HGB 12.9 08/21/2021   HCT 41.5 08/21/2021   PLT 276 08/21/2021   GLUCOSE 120 (H) 08/21/2021   CHOL 211 (H) 08/11/2021   TRIG 72.0 08/11/2021   HDL 73.50 08/11/2021   LDLDIRECT 119.7 05/30/2012   LDLCALC 123 (H) 08/11/2021   ALT 13 08/11/2021   AST 17 08/11/2021   NA 139 08/21/2021   K 4.4 08/21/2021   CL 106 08/21/2021   CREATININE 0.89 08/21/2021   BUN 13 08/21/2021   CO2 25 08/21/2021   TSH 2.07 04/10/2021    DG Chest 2 View  Result Date: 08/21/2021 CLINICAL DATA:  66 year old female with history of chest pain. EXAM: CHEST - 2 VIEW COMPARISON:  Chest x-ray 01/31/2020. FINDINGS: Lung volumes are normal. No consolidative airspace disease. No pleural effusions. No pneumothorax. No pulmonary nodule or mass noted. Pulmonary vasculature and the cardiomediastinal silhouette are within normal limits. IMPRESSION: No radiographic evidence of acute cardiopulmonary disease. Electronically  Signed   By: Vinnie Langton M.D.   On: 08/21/2021 07:19       Assessment & Plan:   Problem List Items Addressed This Visit     Fatty liver    Previous ultrasound revealed fatty liver.  Follow liver function tests.  GERD (gastroesophageal reflux disease)    pepcid helps.  Instructed to take on a regular basis.  Follow.  Notify if continued symptoms.        Relevant Medications   famotidine (PEPCID) 20 MG tablet   History of colonic polyps    Colonoscopy 06/2018.       Hypercholesterolemia    The 10-year ASCVD risk score (Arnett DK, et al., 2019) is: 8.6%   Values used to calculate the score:     Age: 67 years     Sex: Female     Is Non-Hispanic African American: Yes     Diabetic: No     Tobacco smoker: No     Systolic Blood Pressure: 124 mmHg     Is BP treated: Yes     HDL Cholesterol: 73.5 mg/dL     Total Cholesterol: 211 mg/dL  Low cholesterol diet and exercise.  Follow lipid panel.       Hypertension, essential    Blood pressure as outlined.  On micardis '80mg'$ .  Tolerating.  Will hold on making changes in medication.  Follow pressures.  Follow metabolic panel.       Neck nodule    Saw ENT.  S/p biopsy.  No malignancy.  Persistent.  Has appt with ENT for f/u.       Stress    Appears to be handling things relatively well.  Follow.          Einar Pheasant, MD

## 2021-11-03 ENCOUNTER — Encounter: Payer: Self-pay | Admitting: Internal Medicine

## 2021-11-03 NOTE — Assessment & Plan Note (Signed)
Blood pressure as outlined.  On micardis '80mg'$ .  Tolerating.  Will hold on making changes in medication.  Follow pressures.  Follow metabolic panel.

## 2021-11-03 NOTE — Assessment & Plan Note (Signed)
Saw ENT.  S/p biopsy.  No malignancy.  Persistent.  Has appt with ENT for f/u.

## 2021-11-03 NOTE — Assessment & Plan Note (Signed)
pepcid helps.  Instructed to take on a regular basis.  Follow.  Notify if continued symptoms.

## 2021-11-03 NOTE — Assessment & Plan Note (Signed)
Appears to be handling things relatively well.  Follow.  

## 2021-11-03 NOTE — Assessment & Plan Note (Signed)
Previous ultrasound revealed fatty liver.  Follow liver function tests.   

## 2021-11-03 NOTE — Assessment & Plan Note (Signed)
Colonoscopy 06/2018.

## 2021-11-03 NOTE — Assessment & Plan Note (Signed)
The 10-year ASCVD risk score (Arnett DK, et al., 2019) is: 8.6%   Values used to calculate the score:     Age: 66 years     Sex: Female     Is Non-Hispanic African American: Yes     Diabetic: No     Tobacco smoker: No     Systolic Blood Pressure: 098 mmHg     Is BP treated: Yes     HDL Cholesterol: 73.5 mg/dL     Total Cholesterol: 211 mg/dL  Low cholesterol diet and exercise.  Follow lipid panel.

## 2021-11-24 ENCOUNTER — Ambulatory Visit
Admission: EM | Admit: 2021-11-24 | Discharge: 2021-11-24 | Disposition: A | Discharge status: Home / Self Care | Payer: 59 | Attending: Emergency Medicine | Admitting: Emergency Medicine

## 2021-11-24 DIAGNOSIS — R079 Chest pain, unspecified: Secondary | ICD-10-CM | POA: Diagnosis not present

## 2021-11-24 DIAGNOSIS — L509 Urticaria, unspecified: Secondary | ICD-10-CM | POA: Diagnosis not present

## 2021-11-24 DIAGNOSIS — K219 Gastro-esophageal reflux disease without esophagitis: Secondary | ICD-10-CM

## 2021-11-24 MED ORDER — CETIRIZINE HCL 10 MG PO TABS: 10.0000 mg | ORAL_TABLET | Freq: Every day | ORAL | 0 refills | Status: DC

## 2021-11-24 NOTE — ED Triage Notes (Signed)
Patient to Urgent Care with complaints of hives x2 weeks and acid reflux which worsened yesterday after eating.   Reports history of GERD, states she is having epigastric burning. Reports taking 1 prescribed famotidine with some relief but still feels full/ burning.  States that she no longer has hives but last week and last night they were present on her face and legs. States they are often all over her body when they appear. Had some lip swelling on Friday. Denies any new product usage or foods.

## 2021-11-24 NOTE — Discharge Instructions (Addendum)
Go to the emergency department if you have chest pain or other concerning symptoms.   Take the Zyrtec as directed.   Continue taking the famotidine for your reflux symptoms.    Schedule an appointment with your primary care provider soon as possible.

## 2021-11-24 NOTE — ED Provider Notes (Signed)
[facility]    CSN: [id number] Arrival date & time: 11/24/21  1127      History   Chief Complaint Chief Complaint  Patient presents with   Rash    HPI Angel Taylor is a 66 y.o. female.  Patient presents with intermittent episodes of hives x2 weeks.  None currently; last occurred yesterday.  She does not know of a specific cause but has noticed a pattern that the hives occur when she is in the heat, sometimes when in contact with fabrics, sometimes with anxiety.  No difficulty swallowing or breathing.  No new products, medications, foods.  Patient also reports worsening reflux symptoms as 2 days.  She describes this as burning and fullness in her epigastric and midsternal areas.  She has treated this with famotidine and gotten good relief.  No symptoms currently.  No associated symptoms with the reflux; no shortness of breath, weakness, numbness, dizziness.  Patient denies fever, chills, sore throat, cough, shortness of breath, or other symptoms.  Her medical history includes GERD, hypertension, chronic headaches, stress.  The history is provided by the patient and medical records.    Past Medical History:  Diagnosis Date   Anemia    Chronic headaches    Frequent UTI    GERD (gastroesophageal reflux disease)    Pre-eclampsia     Patient Active Problem List   Diagnosis Date Noted   Itching 05/15/2021   Lymphocytosis 04/11/2021   Hypercholesterolemia 05/06/2020   Sore throat 05/05/2020   Elevated troponin 02/01/2020   Atypical chest pain 01/31/2020   Fatty liver 02/29/2016   Hypertension, essential 02/29/2016   Increased pressure in the eye 02/29/2016   Neck nodule 02/29/2016   Neck pain 02/23/2016   Constipation 06/22/2014   Stress 06/22/2014   Health care maintenance 06/22/2014   Rash 05/20/2014   Cough 05/20/2014   Fullness of breast 05/20/2014   Costochondritis 05/11/2013   URI (upper respiratory infection) 04/29/2013   Degenerative disc disease,  cervical 06/11/2012   History of colonic polyps 06/11/2012   GERD (gastroesophageal reflux disease) 06/11/2012    Past Surgical History:  Procedure Laterality Date   ABDOMINAL HYSTERECTOMY     total   [place]    OB History   No obstetric history on file.      Home Medications    Prior to Admission medications   Medication Sig Start Date End Date Taking? Authorizing Provider  cetirizine (ZYRTEC ALLERGY) 10 MG tablet Take 1 tablet (10 mg total) by mouth daily. 11/24/21  Yes Sharion Balloon, NP  aspirin EC 81 MG EC tablet Take 1 tablet (81 mg total) by mouth daily. Swallow whole. 02/03/20   Nolberto Hanlon, MD  famotidine (PEPCID) 20 MG tablet Take 1 tablet (20 mg total) by mouth daily. 11/02/21   Einar Pheasant, MD  metoprolol succinate (TOPROL-XL) 25 MG 24 hr tablet TAKE 1/2 TABLET BY MOUTH DAILY 10/05/21   Einar Pheasant, MD  multivitamin-iron-minerals-folic acid (CENTRUM) chewable tablet Chew 1 tablet by mouth daily.    [provider]  telmisartan (MICARDIS) 80 MG tablet TAKE 1 TABLET BY MOUTH DAILY 10/18/21   Dutch Quint B, FNP  timolol (TIMOPTIC) 0.25 % ophthalmic solution  05/14/20   [provider]    Family History Family History  Problem Relation Age of Onset   Diabetes Mother    Alzheimer's disease Mother    Seizures Mother    Heart disease Father  Breast cancer Neg Hx     Social History Social History   Tobacco Use   Smoking status: Never   Smokeless tobacco: Never  Substance Use Topics   Alcohol use: No    Alcohol/week: 0.0 standard drinks of alcohol   Drug use: No     Allergies   Bactrim [sulfamethoxazole-trimethoprim], Macrobid [nitrofurantoin macrocrystal], Nitrofurantoin, and Sulfa antibiotics   Review of Systems Review of Systems  Constitutional:  Negative for chills and fever.  HENT:  Negative for sore throat.   Respiratory:  Negative for cough and shortness of breath.   Cardiovascular:   Positive for chest pain. Negative for palpitations.  Gastrointestinal:  Negative for abdominal pain, diarrhea, nausea and vomiting.       Acid reflux  Skin:  Positive for rash. Negative for color change.  Neurological:  Negative for dizziness, weakness and numbness.  All other systems reviewed and are negative.    Physical Exam Triage Vital Signs ED Triage Vitals  Enc Vitals Group     BP 11/24/21 1154 (!) 162/92     Pulse Rate 11/24/21 1154 81     Resp 11/24/21 1154 18     Temp 11/24/21 1154 98.1 F (36.7 C)     Temp src --      SpO2 11/24/21 1154 99 %     Weight 11/24/21 1158 110 lb 7.2 oz (50.1 kg)     Height 11/24/21 1158 '5\' 2"'$  (1.575 m)     Head Circumference --      Peak Flow --      Pain Score 11/24/21 1158 1     Pain Loc --      Pain Edu? --      Excl. in [place]? --    No data found.  Updated Vital Signs BP (!) 145/89   Pulse 81   Temp 98.1 F (36.7 C)   Resp 18   Ht '5\' 2"'$  (1.575 m)   Wt 110 lb 7.2 oz (50.1 kg)   SpO2 99%   BMI 20.20 kg/m   Visual Acuity Right Eye Distance:   Left Eye Distance:   Bilateral Distance:    Right Eye Near:   Left Eye Near:    Bilateral Near:     Physical Exam Vitals and nursing note reviewed.  Constitutional:      General: She is not in acute distress.    Appearance: Normal appearance. She is well-developed. She is not ill-appearing.  HENT:     Mouth/Throat:     Mouth: Mucous membranes are moist.  Cardiovascular:     Rate and Rhythm: Normal rate and regular rhythm.     Heart sounds: Normal heart sounds.  Pulmonary:     Effort: Pulmonary effort is normal. No respiratory distress.     Breath sounds: Normal breath sounds.  Abdominal:     General: Bowel sounds are normal.     Palpations: Abdomen is soft.     Tenderness: There is no abdominal tenderness. There is no guarding or rebound.  Musculoskeletal:     Cervical back: Neck supple.  Skin:    General: Skin is warm and dry.     Findings: No rash.  Neurological:      General: No focal deficit present.     Mental Status: She is alert and oriented to person, place, and time.     Sensory: No sensory deficit.     Motor: No weakness.     Gait: Gait normal.  Psychiatric:  Mood and Affect: Mood normal.        Behavior: Behavior normal.      UC Treatments / Results  Labs (all labs ordered are listed, but only abnormal results are displayed) Labs Reviewed - No data to display  EKG   Radiology No results found.  Procedures Procedures (including critical care time)  Medications Ordered in UC Medications - No data to display  Initial Impression / Assessment and Plan / UC Course  I have reviewed the triage vital signs and the nursing notes.  Pertinent labs & imaging results that were available during my care of the patient were reviewed by me and considered in my medical decision making (see chart for details).   Chest pain, hives, GERD.  EKG shows sinus rhythm, rate 70, no ST elevation, compared to previous from 08/24/2021.  Discussed limitations of evaluation in an urgent care setting.  Instructed patient to go to the emergency department if she has chest pain or other concerning symptoms.  She is currently asymptomatic.  Discussed Zyrtec for hives.  Instructed patient to continue taking famotidine for her reflux.  Instructed her to schedule an appointment with her PCP for follow-up.  Education provided on chest pain, hives, GERD.  Patient agrees to plan of care.   Final Clinical Impressions(s) / UC Diagnoses   Final diagnoses:  Chest pain, unspecified type  Hives  Gastroesophageal reflux disease, unspecified whether esophagitis present     Discharge Instructions      Go to the emergency department if you have chest pain or other concerning symptoms.   Take the Zyrtec as directed.   Continue taking the famotidine for your reflux symptoms.    Schedule an appointment with your primary care provider soon as possible.       ED  Prescriptions     Medication Sig Dispense Auth. Provider   cetirizine (ZYRTEC ALLERGY) 10 MG tablet Take 1 tablet (10 mg total) by mouth daily. 14 tablet Sharion Balloon, NP      PDMP not reviewed this encounter.   Sharion Balloon, NP 11/24/21 1239

## 2021-11-26 ENCOUNTER — Ambulatory Visit: Payer: 59 | Admitting: Family

## 2021-12-09 ENCOUNTER — Other Ambulatory Visit (INDEPENDENT_AMBULATORY_CARE_PROVIDER_SITE_OTHER): Payer: 59

## 2021-12-09 DIAGNOSIS — I10 Essential (primary) hypertension: Secondary | ICD-10-CM | POA: Diagnosis not present

## 2021-12-09 DIAGNOSIS — E7800 Pure hypercholesterolemia, unspecified: Secondary | ICD-10-CM

## 2021-12-09 LAB — LIPID PANEL
Cholesterol: 203 mg/dL — ABNORMAL HIGH (ref 0–200)
HDL: 64.1 mg/dL (ref >39.00)
LDL Cholesterol: 123 mg/dL — ABNORMAL HIGH (ref 0–99)
NonHDL: 139.19
Total CHOL/HDL Ratio: 3
Triglycerides: 79 mg/dL (ref 0.0–149.0)
VLDL: 15.8 mg/dL (ref 0.0–40.0)

## 2021-12-09 LAB — CBC WITH DIFFERENTIAL/PLATELET
Basophils Absolute: 0 10*3/uL (ref 0.0–0.1)
Basophils Relative: 0.6 % (ref 0.0–3.0)
Eosinophils Absolute: 0.2 10*3/uL (ref 0.0–0.7)
Eosinophils Relative: 4.9 % (ref 0.0–5.0)
HCT: 38.3 % (ref 36.0–46.0)
Hemoglobin: 12.4 g/dL (ref 12.0–15.0)
Lymphocytes Relative: 55.2 % — ABNORMAL HIGH (ref 12.0–46.0)
Lymphs Abs: 2.1 10*3/uL (ref 0.7–4.0)
MCHC: 32.4 g/dL (ref 30.0–36.0)
MCV: 94.6 fl (ref 78.0–100.0)
Monocytes Absolute: 0.4 10*3/uL (ref 0.1–1.0)
Monocytes Relative: 10.1 % (ref 3.0–12.0)
Neutro Abs: 1.1 10*3/uL — ABNORMAL LOW (ref 1.4–7.7)
Neutrophils Relative %: 29.2 % — ABNORMAL LOW (ref 43.0–77.0)
Platelets: 237 10*3/uL (ref 150.0–400.0)
RBC: 4.05 Mil/uL (ref 3.87–5.11)
RDW: 13.6 % (ref 11.5–15.5)
WBC: 3.8 10*3/uL — ABNORMAL LOW (ref 4.0–10.5)

## 2021-12-09 LAB — BASIC METABOLIC PANEL
BUN: 13 mg/dL (ref 6–23)
CO2: 28 mEq/L (ref 19–32)
Calcium: 9.5 mg/dL (ref 8.4–10.5)
Chloride: 104 mEq/L (ref 96–112)
Creatinine, Ser: 0.96 mg/dL (ref 0.40–1.20)
GFR: 62.42 mL/min (ref >60.00)
Glucose, Bld: 94 mg/dL (ref 70–99)
Potassium: 4.3 mEq/L (ref 3.5–5.1)
Sodium: 139 mEq/L (ref 135–145)

## 2021-12-09 LAB — HEPATIC FUNCTION PANEL
ALT: 11 U/L (ref 0–35)
AST: 16 U/L (ref 0–37)
Albumin: 4 g/dL (ref 3.5–5.2)
Alkaline Phosphatase: 51 U/L (ref 39–117)
Bilirubin, Direct: 0.2 mg/dL (ref 0.0–0.3)
Total Bilirubin: 0.8 mg/dL (ref 0.2–1.2)
Total Protein: 7 g/dL (ref 6.0–8.3)

## 2021-12-14 ENCOUNTER — Encounter: Payer: Self-pay | Admitting: Internal Medicine

## 2021-12-14 ENCOUNTER — Ambulatory Visit (INDEPENDENT_AMBULATORY_CARE_PROVIDER_SITE_OTHER): Payer: 59 | Admitting: Internal Medicine

## 2021-12-14 VITALS — BP 134/80 | HR 61 | Temp 97.8°F | Resp 14 | Ht 62.0 in | Wt 107.4 lb

## 2021-12-14 DIAGNOSIS — D72820 Lymphocytosis (symptomatic): Secondary | ICD-10-CM

## 2021-12-14 DIAGNOSIS — Z23 Encounter for immunization: Secondary | ICD-10-CM

## 2021-12-14 DIAGNOSIS — K219 Gastro-esophageal reflux disease without esophagitis: Secondary | ICD-10-CM

## 2021-12-14 DIAGNOSIS — Z0000 Encounter for general adult medical examination without abnormal findings: Secondary | ICD-10-CM | POA: Diagnosis not present

## 2021-12-14 DIAGNOSIS — E7800 Pure hypercholesterolemia, unspecified: Secondary | ICD-10-CM | POA: Diagnosis not present

## 2021-12-14 DIAGNOSIS — Z860100 Personal history of colon polyps, unspecified: Secondary | ICD-10-CM

## 2021-12-14 DIAGNOSIS — R221 Localized swelling, mass and lump, neck: Secondary | ICD-10-CM

## 2021-12-14 DIAGNOSIS — L509 Urticaria, unspecified: Secondary | ICD-10-CM

## 2021-12-14 DIAGNOSIS — I10 Essential (primary) hypertension: Secondary | ICD-10-CM

## 2021-12-14 DIAGNOSIS — F439 Reaction to severe stress, unspecified: Secondary | ICD-10-CM

## 2021-12-14 DIAGNOSIS — K760 Fatty (change of) liver, not elsewhere classified: Secondary | ICD-10-CM

## 2021-12-14 MED ORDER — CETIRIZINE HCL 10 MG PO TABS: 10.0000 mg | ORAL_TABLET | Freq: Every day | ORAL | 0 refills | Status: DC

## 2021-12-14 NOTE — Progress Notes (Signed)
Patient ID: Angel Taylor, female   DOB: [birth year], 66 y.o.   MRN: [id number]   Subjective:    Patient ID: Angel Taylor, female    DOB: 1955/06/13, 66 y.o.   MRN: [id number]   Patient here for  Chief Complaint  Patient presents with   Annual Exam    Had 1 episode of hives last night. Took zyrtec w/relief.    Marland Kitchen   HPI Here for her physical exam.  Previously increased micardis to '80mg'$  q day.   Pepcid for reflux.  Seen in UC with hives.  Instructed to take zyrtec. Having persistent issue with hives.  Intermittent.  No known trigger.  Taking antihistamine.  Request referral to allergist.  No chest pain.  Breathing stable.  No cough or congestion.  No abdominal pain.     Past Medical History:  Diagnosis Date   Anemia    Chronic headaches    Frequent UTI    GERD (gastroesophageal reflux disease)    Pre-eclampsia    Past Surgical History:  Procedure Laterality Date   ABDOMINAL HYSTERECTOMY     total   CESAREAN SECTION  1987   TUBAL LIGATION  1987   Family History  Problem Relation Age of Onset   Diabetes Mother    Alzheimer's disease Mother    Seizures Mother    Heart disease Father    Breast cancer Neg Hx    Social History   Socioeconomic History   Marital status: Married    Spouse name: Not on file   Number of children: 2   Years of education: Not on file   Highest education level: Not on file  Occupational History   Not on file  Tobacco Use   Smoking status: Never   Smokeless tobacco: Never  Substance and Sexual Activity   Alcohol use: No    Alcohol/week: 0.0 standard drinks of alcohol   Drug use: No   Sexual activity: Not on file  Other Topics Concern   Not on file  Social History Narrative   Not on file   Social Determinants of Health   Financial Resource Strain: Not on file  Food Insecurity: Not on file  Transportation Needs: Not on file  Physical Activity: Not on file  Stress: Not on file  Social Connections: Not on file     Review of Systems   Constitutional:  Negative for appetite change and unexpected weight change.  HENT:  Negative for congestion, sinus pressure and sore throat.   Eyes:  Negative for pain and visual disturbance.  Respiratory:  Negative for cough, chest tightness and shortness of breath.   Cardiovascular:  Negative for chest pain, palpitations and leg swelling.  Gastrointestinal:  Negative for abdominal pain, diarrhea, nausea and vomiting.       Previous constipation - few days.   Genitourinary:  Negative for difficulty urinating and dysuria.  Musculoskeletal:  Negative for joint swelling and myalgias.  Skin:  Negative for color change and rash.  Neurological:  Negative for dizziness, light-headedness and headaches.  Hematological:  Negative for adenopathy. Does not bruise/bleed easily.  Psychiatric/Behavioral:  Negative for agitation and dysphoric mood.        Objective:     BP 134/80 (BP Location: Left Arm, Patient Position: Sitting, Cuff Size: Normal)   Pulse 61   Temp 97.8 F (36.6 C) (Oral)   Resp 14   Ht '5\' 2"'$  (1.575 m)   Wt 107 lb 6.4 oz (48.7 kg)   SpO2  99%   BMI 19.64 kg/m  Wt Readings from Last 3 Encounters:  12/14/21 107 lb 6.4 oz (48.7 kg)  11/24/21 110 lb 7.2 oz (50.1 kg)  11/02/21 110 lb 6.4 oz (50.1 kg)    Physical Exam Vitals reviewed.  Constitutional:      General: She is not in acute distress.    Appearance: Normal appearance. She is well-developed.  HENT:     Head: Normocephalic and atraumatic.     Right Ear: External ear normal.     Left Ear: External ear normal.  Eyes:     General: No scleral icterus.       Right eye: No discharge.        Left eye: No discharge.     Conjunctiva/sclera: Conjunctivae normal.  Neck:     Thyroid: No thyromegaly.  Cardiovascular:     Rate and Rhythm: Normal rate and regular rhythm.  Pulmonary:     Effort: No tachypnea, accessory muscle usage or respiratory distress.     Breath sounds: Normal breath sounds. No decreased breath  sounds or wheezing.  Chest:  Breasts:    Right: No inverted nipple, mass, nipple discharge or tenderness (no axillary adenopathy).     Left: No inverted nipple, mass, nipple discharge or tenderness (no axilarry adenopathy).  Abdominal:     General: Bowel sounds are normal.     Palpations: Abdomen is soft.     Tenderness: There is no abdominal tenderness.  Musculoskeletal:        General: No swelling or tenderness.     Cervical back: Neck supple.  Lymphadenopathy:     Cervical: No cervical adenopathy.  Skin:    Findings: No erythema or rash.  Neurological:     Mental Status: She is alert and oriented to person, place, and time.  Psychiatric:        Mood and Affect: Mood normal.        Behavior: Behavior normal.      Outpatient Encounter Medications as of 12/14/2021  Medication Sig   aspirin EC 81 MG EC tablet Take 1 tablet (81 mg total) by mouth daily. Swallow whole.   famotidine (PEPCID) 20 MG tablet Take 1 tablet (20 mg total) by mouth daily.   latanoprost (XALATAN) 0.005 % ophthalmic solution    metoprolol succinate (TOPROL-XL) 25 MG 24 hr tablet TAKE 1/2 TABLET BY MOUTH DAILY   multivitamin-iron-minerals-folic acid (CENTRUM) chewable tablet Chew 1 tablet by mouth daily.   telmisartan (MICARDIS) 80 MG tablet TAKE 1 TABLET BY MOUTH DAILY   timolol (TIMOPTIC) 0.25 % ophthalmic solution    [DISCONTINUED] cetirizine (ZYRTEC ALLERGY) 10 MG tablet Take 1 tablet (10 mg total) by mouth daily.   cetirizine (ZYRTEC ALLERGY) 10 MG tablet Take 1 tablet (10 mg total) by mouth daily.   No facility-administered encounter medications on file as of 12/14/2021.     Lab Results  Component Value Date   WBC 3.8 (L) 12/09/2021   HGB 12.4 12/09/2021   HCT 38.3 12/09/2021   PLT 237.0 12/09/2021   GLUCOSE 94 12/09/2021   CHOL 203 (H) 12/09/2021   TRIG 79.0 12/09/2021   HDL 64.10 12/09/2021   LDLDIRECT 119.7 05/30/2012   LDLCALC 123 (H) 12/09/2021   ALT 11 12/09/2021   AST 16 12/09/2021    NA 139 12/09/2021   K 4.3 12/09/2021   CL 104 12/09/2021   CREATININE 0.96 12/09/2021   BUN 13 12/09/2021   CO2 28 12/09/2021   TSH 2.07 04/10/2021  Assessment & Plan:   Problem List Items Addressed This Visit     Fatty liver    Previous ultrasound revealed fatty liver.  Follow liver function tests.        GERD (gastroesophageal reflux disease)    Continue pepcid.  Follow.       Health care maintenance    Physical today 12/14/21.  Mammogram 06/23/21 - Birads I. Colonoscopy 06/2018.       History of colonic polyps    Colonoscopy 06/2018.       Hives    Persistent intermittent issue.  No known triggers.  Takes zyrtec.  Request referral to an allergist.        Relevant Orders   Ambulatory referral to Allergy   Hypercholesterolemia    The 10-year ASCVD risk score (Arnett DK, et al., 2019) is: 9.4%   Values used to calculate the score:     Age: 27 years     Sex: Female     Is Non-Hispanic African American: Yes     Diabetic: No     Tobacco smoker: No     Systolic Blood Pressure: 409 mmHg     Is BP treated: Yes     HDL Cholesterol: 64.1 mg/dL     Total Cholesterol: 203 mg/dL  Discussed calculated cholesterol risk.  Will notify me if desires to start cholesterol medication.       Relevant Orders   Hepatic function panel   Lipid panel   TSH   Hypertension, essential    Blood pressure as outlined.  On micardis '80mg'$ .  Tolerating.  Will hold on making changes in medication.  Follow pressures.  Follow metabolic panel.       Relevant Orders   Basic metabolic panel   Lymphocytosis    Follow cbc.       Relevant Orders   CBC with Differential/Platelet   Neck nodule    Dr Richardson Landry (10/02/21) - FNP appeared benign.  Recommended f/u in 4 months.       Stress    Appears to be handling things relatively well.  Follow.        Other Visit Diagnoses     Routine general medical examination at a health care facility    -  Primary   Need for immunization against  influenza       Relevant Orders   Flu Vaccine QUAD 62moIM (Fluarix, Fluzone & Alfiuria Quad PF) (Completed)        CEinar Pheasant MD

## 2021-12-14 NOTE — Assessment & Plan Note (Addendum)
The 10-year ASCVD risk score (Arnett DK, et al., 2019) is: 9.4%   Values used to calculate the score:     Age: 66 years     Sex: Female     Is Non-Hispanic African American: Yes     Diabetic: No     Tobacco smoker: No     Systolic Blood Pressure: 142 mmHg     Is BP treated: Yes     HDL Cholesterol: 64.1 mg/dL     Total Cholesterol: 203 mg/dL  Discussed calculated cholesterol risk.  Will notify me if desires to start cholesterol medication.

## 2021-12-20 ENCOUNTER — Encounter: Payer: Self-pay | Admitting: Internal Medicine

## 2021-12-20 DIAGNOSIS — L509 Urticaria, unspecified: Secondary | ICD-10-CM | POA: Insufficient documentation

## 2021-12-20 NOTE — Assessment & Plan Note (Signed)
Dr Richardson Landry (10/02/21) - FNP appeared benign.  Recommended f/u in 4 months.

## 2021-12-20 NOTE — Assessment & Plan Note (Signed)
Continue pepcid.  Follow.  

## 2021-12-20 NOTE — Assessment & Plan Note (Signed)
Physical today 12/14/21.  Mammogram 06/23/21 - Bir[facility] I. Colonoscopy 06/2018.

## 2021-12-20 NOTE — Assessment & Plan Note (Signed)
Previous ultrasound revealed fatty liver.  Follow liver function tests.   

## 2021-12-20 NOTE — Assessment & Plan Note (Signed)
Appears to be handling things relatively well.  Follow.  

## 2021-12-20 NOTE — Assessment & Plan Note (Signed)
Persistent intermittent issue.  No known triggers.  Takes zyrtec.  Request referral to an allergist.

## 2021-12-20 NOTE — Assessment & Plan Note (Signed)
Colonoscopy 06/2018.

## 2021-12-20 NOTE — Assessment & Plan Note (Signed)
Blood pressure as outlined.  On micardis '80mg'$ .  Tolerating.  Will hold on making changes in medication.  Follow pressures.  Follow metabolic panel.

## 2021-12-20 NOTE — Assessment & Plan Note (Signed)
Follow cbc.  

## 2021-12-25 ENCOUNTER — Other Ambulatory Visit: Payer: Self-pay | Admitting: Otolaryngology

## 2021-12-25 DIAGNOSIS — R221 Localized swelling, mass and lump, neck: Secondary | ICD-10-CM

## 2022-01-02 ENCOUNTER — Encounter: Payer: Self-pay | Admitting: Internal Medicine

## 2022-01-05 ENCOUNTER — Ambulatory Visit
Admission: RE | Admit: 2022-01-05 | Discharge: 2022-01-05 | Disposition: A | Discharge status: Home / Self Care | Payer: 59 | Source: Ambulatory Visit | Attending: Otolaryngology | Admitting: Otolaryngology

## 2022-01-05 DIAGNOSIS — R221 Localized swelling, mass and lump, neck: Secondary | ICD-10-CM

## 2022-01-05 MED ORDER — IOPAMIDOL (ISOVUE-300) INJECTION 61%
75.0000 mL | Freq: Once | INTRAVENOUS | Status: AC | PRN
  Administered 2022-01-05: 75 mL via INTRAVENOUS

## 2022-01-08 ENCOUNTER — Other Ambulatory Visit: Payer: 59

## 2022-01-13 ENCOUNTER — Ambulatory Visit: Payer: 59 | Admitting: Internal Medicine

## 2022-01-28 ENCOUNTER — Other Ambulatory Visit: Payer: Self-pay | Admitting: *Deleted

## 2022-01-28 MED ORDER — TELMISARTAN 80 MG PO TABS: 80.0000 mg | ORAL_TABLET | Freq: Every day | ORAL | 5 refills | Status: DC

## 2022-02-15 ENCOUNTER — Encounter
Admission: RE | Admit: 2022-02-15 | Discharge: 2022-02-15 | Disposition: A | Discharge status: Home / Self Care | Payer: 59 | Source: Ambulatory Visit | Attending: Otolaryngology | Admitting: Otolaryngology

## 2022-02-15 HISTORY — DX: Pure hypercholesterolemia, unspecified: E78.00

## 2022-02-15 HISTORY — DX: Fatty (change of) liver, not elsewhere classified: K76.0

## 2022-02-15 HISTORY — DX: Unspecified glaucoma: H40.9

## 2022-02-15 HISTORY — DX: Other diseases of salivary glands: K11.8

## 2022-02-15 HISTORY — DX: Other cervical disc degeneration, unspecified cervical region: M50.30

## 2022-02-15 NOTE — Patient Instructions (Signed)
Your procedure is scheduled on:02-17-22 Wednesday Report to the Registration Desk on the 1st floor of the [facility].Then proceed to the 2nd floor Surgery Desk To find out your arrival time, please call [phone] between 1PM - 3PM on:02-16-22 Tuesday If your arrival time is 6:00 am, do not arrive prior to that time as the [facility] entrance doors do not open until 6:00 am.  REMEMBER: Instructions that are not followed completely may result in serious medical risk, up to and including death; or upon the discretion of your surgeon and anesthesiologist your surgery may need to be rescheduled.  Do not eat food after midnight the night before surgery.  No gum chewing, lozengers or hard candies.  You may however, drink CLEAR liquids up to 2 hours before you are scheduled to arrive for your surgery. Do not drink anything within 2 hours of your scheduled arrival time.  Clear liquids include: - water  - apple juice without pulp - gatorade (not RED colors) - black coffee or tea (Do NOT add milk or creamers to the coffee or tea) Do NOT drink anything that is not on this list.  TAKE THESE MEDICATIONS THE MORNING OF SURGERY WITH A SIP OF WATER: -metoprolol succinate (TOPROL-XL)  -famotidine (PEPCID) -take one the night before and one on the morning of surgery - helps to prevent nausea after surgery.)  Aspirin was last taken on 02-09-22 as instructed by Dr. Richardson Landry  One week prior to surgery: Stop Anti-inflammatories (NSAIDS) such as Advil, Aleve, Ibuprofen, Motrin, Naproxen, Naprosyn and Aspirin based products such as Excedrin, Goodys Powder, BC Powder.You may however, take Tylenol if needed for pain up until the day of surgery.  Stop ANY OVER THE COUNTER supplements/vitamins NOW (02-15-22) until after surgery (multivitamin)  No Alcohol for 24 hours before or after surgery.  No Smoking including e-cigarettes for 24 hours prior to surgery.  No chewable tobacco products for at least 6  hours prior to surgery.  No nicotine patches on the day of surgery.  Do not use any "recreational" drugs for at least a week prior to your surgery.  Please be advised that the combination of cocaine and anesthesia may have negative outcomes, up to and including death. If you test positive for cocaine, your surgery will be cancelled.  On the morning of surgery brush your teeth with toothpaste and water, you may rinse your mouth with mouthwash if you wish. Do not swallow any toothpaste or mouthwash.  Use CHG Soap as directed on instruction sheet.  Do not wear jewelry, make-up, hairpins, clips or nail polish.  Do not wear lotions, powders, or perfumes.   Do not shave body from the neck down 48 hours prior to surgery just in case you cut yourself which could leave a site for infection.  Also, freshly shaved skin may become irritated if using the CHG soap.  Contact lenses, hearing aids and dentures may not be worn into surgery.  Do not bring valuables to the hospital. [facility] is not responsible for any missing/lost belongings or valuables.   Notify your doctor if there is any change in your medical condition (cold, fever, infection).  Wear comfortable clothing (specific to your surgery type) to the hospital.  After surgery, you can help prevent lung complications by doing breathing exercises.  Take deep breaths and cough every 1-2 hours. Your doctor may order a device called an Incentive Spirometer to help you take deep breaths. When coughing or sneezing, hold a pillow firmly against  your incision with both hands. This is called "splinting." Doing this helps protect your incision. It also decreases belly discomfort.  If you are being admitted to the hospital overnight, leave your suitcase in the car. After surgery it may be brought to your room.  If you are being discharged the day of surgery, you will not be allowed to drive home. You will need a responsible adult (18 years or  older) to drive you home and stay with you that night.   If you are taking public transportation, you will need to have a responsible adult (18 years or older) with you. Please confirm with your physician that it is acceptable to use public transportation.   Please call the [facility] Dept. at [phone] if you have any questions about these instructions.  Surgery Visitation Policy:  Patients undergoing a surgery or procedure may have two family members or support persons with them as long as the person is not COVID-19 positive or experiencing its symptoms.

## 2022-02-17 ENCOUNTER — Encounter
Admission: RE | Disposition: A | Discharge status: Home / Self Care | Payer: Self-pay | Source: Home / Self Care | Attending: Otolaryngology

## 2022-02-17 ENCOUNTER — Ambulatory Visit: Payer: 59 | Admitting: Certified Registered"

## 2022-02-17 ENCOUNTER — Encounter: Payer: Self-pay | Admitting: Otolaryngology

## 2022-02-17 ENCOUNTER — Other Ambulatory Visit: Payer: Self-pay

## 2022-02-17 ENCOUNTER — Ambulatory Visit
Admission: RE | Admit: 2022-02-17 | Discharge: 2022-02-17 | Disposition: A | Discharge status: Home / Self Care | Payer: 59 | Attending: Otolaryngology | Admitting: Otolaryngology

## 2022-02-17 DIAGNOSIS — Z79899 Other long term (current) drug therapy: Secondary | ICD-10-CM | POA: Diagnosis not present

## 2022-02-17 DIAGNOSIS — D117 Benign neoplasm of other major salivary glands: Secondary | ICD-10-CM | POA: Diagnosis not present

## 2022-02-17 DIAGNOSIS — R220 Localized swelling, mass and lump, head: Secondary | ICD-10-CM | POA: Diagnosis present

## 2022-02-17 DIAGNOSIS — K219 Gastro-esophageal reflux disease without esophagitis: Secondary | ICD-10-CM | POA: Insufficient documentation

## 2022-02-17 DIAGNOSIS — M1990 Unspecified osteoarthritis, unspecified site: Secondary | ICD-10-CM | POA: Insufficient documentation

## 2022-02-17 DIAGNOSIS — E7800 Pure hypercholesterolemia, unspecified: Secondary | ICD-10-CM | POA: Diagnosis not present

## 2022-02-17 DIAGNOSIS — Z87891 Personal history of nicotine dependence: Secondary | ICD-10-CM | POA: Insufficient documentation

## 2022-02-17 HISTORY — PX: MASS EXCISION: SHX2000

## 2022-02-17 SURGERY — EXCISION MASS
Anesthesia: General | Site: Chin | Laterality: Right

## 2022-02-17 MED ORDER — SUCCINYLCHOLINE CHLORIDE 200 MG/10ML IV SOSY
PREFILLED_SYRINGE | INTRAVENOUS | Status: DC | PRN
  Administered 2022-02-17: 60 mg via INTRAVENOUS

## 2022-02-17 MED ORDER — MIDAZOLAM HCL 2 MG/2ML IJ SOLN
INTRAMUSCULAR | Status: AC
  Filled 2022-02-17: qty 2

## 2022-02-17 MED ORDER — BACITRACIN 500 UNIT/GM EX OINT
TOPICAL_OINTMENT | CUTANEOUS | Status: DC | PRN
  Administered 2022-02-17: 1 via TOPICAL

## 2022-02-17 MED ORDER — LIDOCAINE-EPINEPHRINE (PF) 1 %-1:200000 IJ SOLN
INTRAMUSCULAR | Status: DC | PRN
  Administered 2022-02-17: 2 mL

## 2022-02-17 MED ORDER — PHENYLEPHRINE HCL-NACL 20-0.9 MG/250ML-% IV SOLN
INTRAVENOUS | Status: DC | PRN
  Administered 2022-02-17: 10 ug/min via INTRAVENOUS

## 2022-02-17 MED ORDER — FENTANYL CITRATE (PF) 100 MCG/2ML IJ SOLN
INTRAMUSCULAR | Status: AC
  Filled 2022-02-17: qty 2

## 2022-02-17 MED ORDER — ACETAMINOPHEN 10 MG/ML IV SOLN
500.0000 mg | Freq: Once | INTRAVENOUS | Status: DC | PRN
  Administered 2022-02-17: 500 mg via INTRAVENOUS

## 2022-02-17 MED ORDER — HYDROCODONE-ACETAMINOPHEN 5-325 MG PO TABS: 1.0000 | ORAL_TABLET | Freq: Four times a day (QID) | ORAL | 0 refills | Status: DC | PRN

## 2022-02-17 MED ORDER — CHLORHEXIDINE GLUCONATE 0.12 % MT SOLN: 15.0000 mL | Freq: Once | OROMUCOSAL | Status: AC

## 2022-02-17 MED ORDER — ONDANSETRON HCL 4 MG/2ML IJ SOLN: 4.0000 mg | Freq: Once | INTRAMUSCULAR | Status: DC | PRN

## 2022-02-17 MED ORDER — ACETAMINOPHEN 10 MG/ML IV SOLN
INTRAVENOUS | Status: AC
  Filled 2022-02-17: qty 100

## 2022-02-17 MED ORDER — FENTANYL CITRATE (PF) 100 MCG/2ML IJ SOLN
INTRAMUSCULAR | Status: AC
  Administered 2022-02-17: 25 ug via INTRAVENOUS
  Filled 2022-02-17: qty 2

## 2022-02-17 MED ORDER — OXYCODONE HCL 5 MG/5ML PO SOLN: 5.0000 mg | Freq: Once | ORAL | Status: DC | PRN

## 2022-02-17 MED ORDER — EPHEDRINE SULFATE (PRESSORS) 50 MG/ML IJ SOLN
INTRAMUSCULAR | Status: DC | PRN
  Administered 2022-02-17 (×2): 5 mg via INTRAVENOUS

## 2022-02-17 MED ORDER — PHENYLEPHRINE HCL (PRESSORS) 10 MG/ML IV SOLN
INTRAVENOUS | Status: DC | PRN
  Administered 2022-02-17: 80 ug via INTRAVENOUS
  Administered 2022-02-17 (×2): 40 ug via INTRAVENOUS

## 2022-02-17 MED ORDER — ONDANSETRON HCL 4 MG/2ML IJ SOLN
INTRAMUSCULAR | Status: DC | PRN
  Administered 2022-02-17: 4 mg via INTRAVENOUS

## 2022-02-17 MED ORDER — PROPOFOL 10 MG/ML IV BOLUS
INTRAVENOUS | Status: DC | PRN
  Administered 2022-02-17: 100 mg via INTRAVENOUS
  Administered 2022-02-17: 30 mg via INTRAVENOUS

## 2022-02-17 MED ORDER — ORAL CARE MOUTH RINSE: 15.0000 mL | Freq: Once | OROMUCOSAL | Status: AC

## 2022-02-17 MED ORDER — BACITRACIN ZINC 500 UNIT/GM EX OINT
TOPICAL_OINTMENT | CUTANEOUS | Status: AC
  Filled 2022-02-17: qty 28.35

## 2022-02-17 MED ORDER — DEXAMETHASONE SODIUM PHOSPHATE 10 MG/ML IJ SOLN
INTRAMUSCULAR | Status: DC | PRN
  Administered 2022-02-17: 10 mg via INTRAVENOUS

## 2022-02-17 MED ORDER — MIDAZOLAM HCL 2 MG/2ML IJ SOLN
INTRAMUSCULAR | Status: DC | PRN
  Administered 2022-02-17 (×2): .5 mg via INTRAVENOUS

## 2022-02-17 MED ORDER — FENTANYL CITRATE (PF) 100 MCG/2ML IJ SOLN
INTRAMUSCULAR | Status: DC | PRN
  Administered 2022-02-17 (×4): 12.5 ug via INTRAVENOUS
  Administered 2022-02-17: 50 ug via INTRAVENOUS

## 2022-02-17 MED ORDER — LIDOCAINE HCL (CARDIAC) PF 100 MG/5ML IV SOSY
PREFILLED_SYRINGE | INTRAVENOUS | Status: DC | PRN
  Administered 2022-02-17: 50 mg via INTRAVENOUS

## 2022-02-17 MED ORDER — PROPOFOL 10 MG/ML IV BOLUS
INTRAVENOUS | Status: AC
  Filled 2022-02-17: qty 40

## 2022-02-17 MED ORDER — PROPOFOL 500 MG/50ML IV EMUL
INTRAVENOUS | Status: DC | PRN
  Administered 2022-02-17: 20 ug/kg/min via INTRAVENOUS

## 2022-02-17 MED ORDER — LACTATED RINGERS IV SOLN: INTRAVENOUS | Status: DC

## 2022-02-17 MED ORDER — GLYCOPYRROLATE 0.2 MG/ML IJ SOLN
INTRAMUSCULAR | Status: DC | PRN
  Administered 2022-02-17: .1 mg via INTRAVENOUS

## 2022-02-17 MED ORDER — FENTANYL CITRATE (PF) 100 MCG/2ML IJ SOLN
25.0000 ug | INTRAMUSCULAR | Status: DC | PRN
  Administered 2022-02-17: 25 ug via INTRAVENOUS

## 2022-02-17 MED ORDER — LIDOCAINE-EPINEPHRINE 1 %-1:100000 IJ SOLN
INTRAMUSCULAR | Status: AC
  Filled 2022-02-17: qty 1

## 2022-02-17 MED ORDER — CHLORHEXIDINE GLUCONATE 0.12 % MT SOLN
OROMUCOSAL | Status: AC
  Administered 2022-02-17: 15 mL via OROMUCOSAL
  Filled 2022-02-17: qty 15

## 2022-02-17 MED ORDER — PHENYLEPHRINE HCL-NACL 20-0.9 MG/250ML-% IV SOLN
INTRAVENOUS | Status: AC
  Filled 2022-02-17: qty 250

## 2022-02-17 MED ORDER — OXYCODONE HCL 5 MG PO TABS: 5.0000 mg | ORAL_TABLET | Freq: Once | ORAL | Status: DC | PRN

## 2022-02-17 MED ORDER — SEVOFLURANE IN SOLN
RESPIRATORY_TRACT | Status: AC
  Filled 2022-02-17: qty 250

## 2022-02-17 MED ORDER — 0.9 % SODIUM CHLORIDE (POUR BTL) OPTIME
TOPICAL | Status: DC | PRN
  Administered 2022-02-17: 500 mL

## 2022-02-17 SURGICAL SUPPLY — 34 items
BLADE SURG 15 STRL LF DISP TIS (BLADE) ×1 IMPLANT
BLADE SURG 15 STRL SS (BLADE) ×1
CORD BIP STRL DISP 12FT (MISCELLANEOUS) ×1 IMPLANT
DRAPE MAG INST 16X20 L/F (DRAPES) ×1 IMPLANT
DRSG TEGADERM 2-3/8X2-3/4 SM (GAUZE/BANDAGES/DRESSINGS) ×1 IMPLANT
DRSG TELFA 3X4 N-ADH STERILE (GAUZE/BANDAGES/DRESSINGS) IMPLANT
ELECT EMG 20MM DUAL (MISCELLANEOUS) ×1
ELECT NEEDLE 20X.3 GREEN (MISCELLANEOUS)
ELECT REM PT RETURN 9FT ADLT (ELECTROSURGICAL) ×1
ELECTRODE EMG 20MM DUAL (MISCELLANEOUS) IMPLANT
ELECTRODE NDL 20X.3 GREEN (MISCELLANEOUS) IMPLANT
ELECTRODE NEEDLE 20X.3 GREEN (MISCELLANEOUS) IMPLANT
ELECTRODE REM PT RTRN 9FT ADLT (ELECTROSURGICAL) ×1 IMPLANT
FORCEPS JEWEL BIP 4-3/4 STR (INSTRUMENTS) ×1 IMPLANT
GAUZE 4X4 16PLY ~~LOC~~+RFID DBL (SPONGE) ×2 IMPLANT
GLOVE BIO SURGEON STRL SZ7.5 (GLOVE) ×2 IMPLANT
GOWN STRL REUS W/ TWL LRG LVL3 (GOWN DISPOSABLE) ×3 IMPLANT
GOWN STRL REUS W/TWL LRG LVL3 (GOWN DISPOSABLE) ×3
HOOK STAY BLUNT/RETRACTOR 5M (MISCELLANEOUS) ×1 IMPLANT
KIT TURNOVER KIT A (KITS) ×1 IMPLANT
LABEL OR SOLS (LABEL) ×1 IMPLANT
MANIFOLD NEPTUNE II (INSTRUMENTS) ×1 IMPLANT
NS IRRIG 500ML POUR BTL (IV SOLUTION) ×1 IMPLANT
PACK HEAD/NECK (MISCELLANEOUS) ×1 IMPLANT
PROBE MONO 100X0.75 ELECT 1.9M (MISCELLANEOUS) IMPLANT
SHEARS HARMONIC 9CM CVD (BLADE) ×1 IMPLANT
SPONGE KITTNER 5P (MISCELLANEOUS) ×1 IMPLANT
SUT PROLENE 5 0 PS 3 (SUTURE) ×1 IMPLANT
SUT SILK 2 0 (SUTURE) ×1
SUT SILK 2 0 PERMA HAND 18 BK (SUTURE) ×1 IMPLANT
SUT SILK 2-0 18XBRD TIE 12 (SUTURE) ×1 IMPLANT
SUT VIC AB 4-0 RB1 18 (SUTURE) ×1 IMPLANT
SYSTEM CHEST DRAIN TLS 7FR (DRAIN) IMPLANT
TRAP FLUID SMOKE EVACUATOR (MISCELLANEOUS) ×1 IMPLANT

## 2022-02-17 NOTE — H&P (Signed)
History and physical reviewed and will be scanned in later. No change in medical status reported by the patient or family, appears stable for surgery. All questions regarding the procedure answered, and patient (or family if a child) expressed understanding of the procedure. ? ?Angel Taylor [name] ?@[email or web address]@ ?

## 2022-02-17 NOTE — Discharge Instructions (Addendum)

## 2022-02-17 NOTE — Anesthesia Procedure Notes (Signed)
Procedure Name: Intubation Date/Time: 02/17/2022 8:37 AM  Performed by: Adalberto Ill, CRNAPre-anesthesia Checklist: Patient identified, Emergency Drugs available, Suction available, Patient being monitored and Timeout performed Patient Re-evaluated:Patient Re-evaluated prior to induction Oxygen Delivery Method: Circle system utilized Preoxygenation: Pre-oxygenation with 100% oxygen Induction Type: IV induction Ventilation: Mask ventilation without difficulty Laryngoscope Size: Miller and 2 Grade View: Grade I Tube type: Oral Tube size: 6.5 mm Number of attempts: 1 (brief atraumatic dentition unchanged) Airway Equipment and Method: Stylet Placement Confirmation: ETT inserted through vocal cords under direct vision, positive ETCO2 and breath sounds checked- equal and bilateral Secured at: 20 cm Tube secured with: Tape Dental Injury: Teeth and Oropharynx as per pre-operative assessment

## 2022-02-17 NOTE — Transfer of Care (Signed)
Immediate Anesthesia Transfer of Care Note  Patient: Karime Scheuermann  Procedure(s) Performed: EXCISION SUBMANDIBULAR MASS (Right: Chin)  Patient Location: PACU  Anesthesia Type:General  Level of Consciousness: awake and drowsy  Airway & Oxygen Therapy: Patient Spontanous Breathing and Patient connected to face mask oxygen  Post-op Assessment: Report given to RN and Post -op Vital signs reviewed and stable  Post vital signs: Reviewed and stable  Last Vitals:  Vitals Value Taken Time  BP 121/73 02/17/22 0900  Temp    Pulse 71 02/17/22 0900  Resp 17 02/17/22 0900  SpO2 100 % 02/17/22 0900  Vitals shown include unvalidated device data.  Last Pain:  Vitals:   02/17/22 0623  TempSrc: Temporal  PainSc: 0-No pain         Complications: No notable events documented.

## 2022-02-17 NOTE — Anesthesia Preprocedure Evaluation (Signed)
Anesthesia Evaluation  Patient identified by MRN, date of birth, ID band Patient awake    Reviewed: Allergy & Precautions, NPO status , Patient's Chart, lab work & pertinent test results  History of Anesthesia Complications Negative for: history of anesthetic complications  Airway Mallampati: III  TM Distance: >3 FB Neck ROM: Full    Dental no notable dental hx. (+) Teeth Intact   Pulmonary neg pulmonary ROS, neg sleep apnea, neg COPD, Patient abstained from smoking.Not current smoker   Pulmonary exam normal breath sounds clear to auscultation       Cardiovascular Exercise Tolerance: Good METShypertension, (-) CAD and (-) Past MI (-) dysrhythmias  Rhythm:Regular Rate:Normal - Systolic murmurs    Neuro/Psych  Headaches  negative psych ROS   GI/Hepatic ,GERD  Medicated,,(+)     (-) substance abuse    Endo/Other  neg diabetes    Renal/GU negative Renal ROS     Musculoskeletal  (+) Arthritis ,    Abdominal   Peds  Hematology  (+) Blood dyscrasia, anemia   Anesthesia Other Findings Past Medical History: No date: Anemia No date: Chronic headaches No date: DDD (degenerative disc disease), cervical No date: Fatty liver No date: Frequent UTI No date: GERD (gastroesophageal reflux disease) No date: Glaucoma (increased eye pressure) No date: Hypercholesterolemia No date: Pre-eclampsia No date: Submandibular gland mass     Comment:  right  Reproductive/Obstetrics                              Anesthesia Physical Anesthesia Plan  ASA: 2  Anesthesia Plan: General   Post-op Pain Management: Minimal or no pain anticipated   Induction: Intravenous  PONV Risk Score and Plan: 4 or greater and Ondansetron, Dexamethasone and Midazolam  Airway Management Planned: Oral ETT  Additional Equipment: None  Intra-op Plan:   Post-operative Plan: Extubation in OR  Informed Consent: I have  reviewed the patients History and Physical, chart, labs and discussed the procedure including the risks, benefits and alternatives for the proposed anesthesia with the patient or authorized representative who has indicated his/her understanding and acceptance.     Dental advisory given  Plan Discussed with: CRNA and Surgeon  Anesthesia Plan Comments: (Discussed risks of anesthesia with patient, including PONV, sore throat, lip/dental/eye damage. Rare risks discussed as well, such as cardiorespiratory and neurological sequelae, and allergic reactions. Discussed the role of CRNA in patient's perioperative care. Patient understands.)         Anesthesia Quick Evaluation

## 2022-02-17 NOTE — Anesthesia Postprocedure Evaluation (Signed)
Anesthesia Post Note  Patient: Angel Taylor  Procedure(s) Performed: EXCISION SUBMANDIBULAR MASS (Right: Chin)  Patient location during evaluation: PACU Anesthesia Type: General Level of consciousness: awake and alert Pain management: pain level controlled Vital Signs Assessment: post-procedure vital signs reviewed and stable Respiratory status: spontaneous breathing, nonlabored ventilation, respiratory function stable and patient connected to nasal cannula oxygen Cardiovascular status: blood pressure returned to baseline and stable Postop Assessment: no apparent nausea or vomiting Anesthetic complications: no   No notable events documented.   Last Vitals:  Vitals:   02/17/22 0945 02/17/22 1002  BP: 105/63 123/72  Pulse: (!) 53 (!) 53  Resp: 20 14  Temp: (!) 36.4 C (!) 36.3 C  SpO2: 99% 100%    Last Pain:  Vitals:   02/17/22 1002  TempSrc: Temporal  PainSc: 0-No pain                 Arita Miss

## 2022-02-17 NOTE — Op Note (Signed)
02/17/2022  8:51 AM    [name]  [id number]   Pre-Op Diagnosis:  Right submandibular gland mass  Post-op Diagnosis: Same  Procedure: Excision of right Submandibular gland  Surgeon:  Riley Nearing  Assistant: Margaretha Sheffield  Anesthesia:  General endotracheal anesthesia  EBL:  less than 20 cc  Complications:  None  Findings: 2 cm mass within the anterior aspect of the right submandibular gland  Procedure: The patient was taken to the Operating Room and placed in the supine position.  After induction of general endotracheal anesthesia, the patient was turned 90 degrees and placed on a shoulder roll. The skin was injected along the proposed incision inferior to the right submandibular region with 1% lidocaine with epinephrine, 1:100,000. The facial nerve monitor electrodes were placed in the usual fashion at the lower lip on the same side of the procedure. Proper functioning of the nerve monitor was assessed. The area was then prepped and draped in the usual sterile fashion.   A 15 blade was then used to incise the skin. The dissection was carried down to the subcutaneous tissues with the harmonic scalpel and through the platysma muscle. Dissection then proceeded deeper, dissecting down to the inferior margin of the submandibular gland, watching for the marginal mandibular nerve during dissection. The inferior aspect of the gland was dissected away from the digastric muscle using the harmonic scalpel, and the fascia over the gland dissected superiorly from the surface of the gland, again watching for the marginal nerve during dissection. The gland was freed anteriorly from the mylohyoid muscle and also freed posteriorly. Dissecting under the gland the hypoglossal nerve was identified and protected. Dissection proceeded superiorly over the lateral aspect of the gland, dividing vascular supply to the gland from the facial artery with the harmonic scalpel. Superiorly the nerve branch from  the lingual nerve was divided with the harmonic scalpel, taking care to avoid injury to the lingual nerve itself. Next the submandibular duct was identified, clamped and suture ligated just under the mylohyoid muscle. The remainder of the gland was dissected out from under the mylohyoid, and handed off as a specimen.  The wound was irrigated with saline and hemostasis obtained. A #7 TLS drain was placed through a separate stab incision in the skin posterior and inferior to the incision in the neck, and secured with a 5-0 Prolene suture. Deep closure was performed with 4-0 Vicryl suture, closing the platysma. The subcutaneous tissues were then closed with 4-0 Vicryl suture in an interrupted fashion. The skin was closed with 5-0 Prolene suture in a running locked stitch. Bacitracin ointment was applied to the wound.   The patient was then returned to the anesthesiologist for awakening, and was taken to the Recovery Room in stable condition.   Cultures:  None.   Disposition:   PACU then discharge home   Plan: Discharge home with drain in place. Follow-up tomorrow for drain removal.     Riley Nearing 02/17/2022 8:51 AM

## 2022-02-18 LAB — SURGICAL PATHOLOGY

## 2022-03-17 ENCOUNTER — Encounter: Payer: Self-pay | Admitting: Internal Medicine

## 2022-03-18 ENCOUNTER — Other Ambulatory Visit: Payer: Self-pay

## 2022-03-18 MED ORDER — METOPROLOL SUCCINATE ER 25 MG PO TB24: 12.5000 mg | ORAL_TABLET | Freq: Every day | ORAL | 3 refills | Status: DC

## 2022-04-14 ENCOUNTER — Other Ambulatory Visit (INDEPENDENT_AMBULATORY_CARE_PROVIDER_SITE_OTHER): Payer: 59

## 2022-04-14 DIAGNOSIS — E7800 Pure hypercholesterolemia, unspecified: Secondary | ICD-10-CM

## 2022-04-14 DIAGNOSIS — I10 Essential (primary) hypertension: Secondary | ICD-10-CM

## 2022-04-14 DIAGNOSIS — D72820 Lymphocytosis (symptomatic): Secondary | ICD-10-CM | POA: Diagnosis not present

## 2022-04-14 LAB — HEPATIC FUNCTION PANEL
ALT: 12 U/L (ref 0–35)
AST: 17 U/L (ref 0–37)
Albumin: 4.1 g/dL (ref 3.5–5.2)
Alkaline Phosphatase: 56 U/L (ref 39–117)
Bilirubin, Direct: 0.1 mg/dL (ref 0.0–0.3)
Total Bilirubin: 0.8 mg/dL (ref 0.2–1.2)
Total Protein: 6.6 g/dL (ref 6.0–8.3)

## 2022-04-14 LAB — LIPID PANEL
Cholesterol: 217 mg/dL — ABNORMAL HIGH (ref 0–200)
HDL: 69.1 mg/dL (ref >39.00)
LDL Cholesterol: 130 mg/dL — ABNORMAL HIGH (ref 0–99)
NonHDL: 147.53
Total CHOL/HDL Ratio: 3
Triglycerides: 89 mg/dL (ref 0.0–149.0)
VLDL: 17.8 mg/dL (ref 0.0–40.0)

## 2022-04-14 LAB — CBC WITH DIFFERENTIAL/PLATELET
Basophils Absolute: 0.1 10*3/uL (ref 0.0–0.1)
Basophils Relative: 2.1 % (ref 0.0–3.0)
Eosinophils Absolute: 0.1 10*3/uL (ref 0.0–0.7)
Eosinophils Relative: 2.6 % (ref 0.0–5.0)
HCT: 37.9 % (ref 36.0–46.0)
Hemoglobin: 12.4 g/dL (ref 12.0–15.0)
Lymphocytes Relative: 52.6 % — ABNORMAL HIGH (ref 12.0–46.0)
Lymphs Abs: 2.4 10*3/uL (ref 0.7–4.0)
MCHC: 32.8 g/dL (ref 30.0–36.0)
MCV: 95.3 fl (ref 78.0–100.0)
Monocytes Absolute: 0.4 10*3/uL (ref 0.1–1.0)
Monocytes Relative: 8.7 % (ref 3.0–12.0)
Neutro Abs: 1.5 10*3/uL (ref 1.4–7.7)
Neutrophils Relative %: 34 % — ABNORMAL LOW (ref 43.0–77.0)
Platelets: 264 10*3/uL (ref 150.0–400.0)
RBC: 3.98 Mil/uL (ref 3.87–5.11)
RDW: 13.1 % (ref 11.5–15.5)
WBC: 4.5 10*3/uL (ref 4.0–10.5)

## 2022-04-14 LAB — TSH: TSH: 1.77 u[IU]/mL (ref 0.35–5.50)

## 2022-04-14 LAB — BASIC METABOLIC PANEL
BUN: 11 mg/dL (ref 6–23)
CO2: 28 mEq/L (ref 19–32)
Calcium: 9.2 mg/dL (ref 8.4–10.5)
Chloride: 105 mEq/L (ref 96–112)
Creatinine, Ser: 0.86 mg/dL (ref 0.40–1.20)
GFR: 71.06 mL/min (ref >60.00)
Glucose, Bld: 98 mg/dL (ref 70–99)
Potassium: 4.2 mEq/L (ref 3.5–5.1)
Sodium: 142 mEq/L (ref 135–145)

## 2022-04-19 ENCOUNTER — Encounter: Payer: Self-pay | Admitting: Internal Medicine

## 2022-04-19 ENCOUNTER — Ambulatory Visit: Payer: 59 | Admitting: Internal Medicine

## 2022-04-19 VITALS — BP 126/80 | HR 68 | Temp 97.6°F | Resp 13 | Ht 62.0 in | Wt 107.6 lb

## 2022-04-19 DIAGNOSIS — Z1231 Encounter for screening mammogram for malignant neoplasm of breast: Secondary | ICD-10-CM

## 2022-04-19 DIAGNOSIS — M5030 Other cervical disc degeneration, unspecified cervical region: Secondary | ICD-10-CM | POA: Diagnosis not present

## 2022-04-19 DIAGNOSIS — F439 Reaction to severe stress, unspecified: Secondary | ICD-10-CM

## 2022-04-19 DIAGNOSIS — R221 Localized swelling, mass and lump, neck: Secondary | ICD-10-CM

## 2022-04-19 DIAGNOSIS — E7800 Pure hypercholesterolemia, unspecified: Secondary | ICD-10-CM | POA: Diagnosis not present

## 2022-04-19 DIAGNOSIS — I10 Essential (primary) hypertension: Secondary | ICD-10-CM

## 2022-04-19 DIAGNOSIS — K219 Gastro-esophageal reflux disease without esophagitis: Secondary | ICD-10-CM

## 2022-04-19 DIAGNOSIS — R42 Dizziness and giddiness: Secondary | ICD-10-CM

## 2022-04-19 DIAGNOSIS — E2839 Other primary ovarian failure: Secondary | ICD-10-CM | POA: Diagnosis not present

## 2022-04-19 DIAGNOSIS — K760 Fatty (change of) liver, not elsewhere classified: Secondary | ICD-10-CM

## 2022-04-19 NOTE — Assessment & Plan Note (Signed)
The 10-year ASCVD risk score (Arnett DK, et al., 2019) is: 8.5%   Values used to calculate the score:     Age: 67 years     Sex: Female     Is Non-Hispanic African American: Yes     Diabetic: No     Tobacco smoker: No     Systolic Blood Pressure: 981 mmHg     Is BP treated: Yes     HDL Cholesterol: 69.1 mg/dL     Total Cholesterol: 217 mg/dL  Discussed calculated cholesterol risk.  Will notify me if desires to start cholesterol medication.

## 2022-04-19 NOTE — Progress Notes (Signed)
Subjective:    Patient ID: Angel Taylor, female    DOB: 1956/02/08, 67 y.o.   MRN: [id number]  Patient here for  Chief Complaint  Patient presents with   Medical Management of Chronic Issues   Hypertension    HPI Here to follow up regarding her blood pressure, cholesterol, increased stress and reflux.  Also, previous visit, was having issues with hives.  Referred to allergist.  Is s/p excision of right submandibular gland (mass).  Pathology pleomorphic adenoma.  Negative for atypia and malignancy.  She does report noticing if she stands up quickly or if bends over and comes up fast, notices being light headed.  Also has neck issues.  Has been to PT previously.  Did help. Discussed neck exercises.  Also discussed need to eat regular meals (she sometimes skips meals).  Needs to also stay hydrated.  No chest pain.  No increased heart rate or palpitations.  No sob or increased cough.  No abdominal pain.  Bowels moving.  Discussed bone density.     Past Medical History:  Diagnosis Date   Anemia    Chronic headaches    DDD (degenerative disc disease), cervical    Fatty liver    Frequent UTI    GERD (gastroesophageal reflux disease)    Glaucoma (increased eye pressure)    Hypercholesterolemia    Pre-eclampsia    Submandibular gland mass    right   Past Surgical History:  Procedure Laterality Date   ABDOMINAL HYSTERECTOMY     total   CESAREAN SECTION  04/05/1985   COLONOSCOPY     MASS EXCISION Right 02/17/2022   Procedure: EXCISION SUBMANDIBULAR MASS;  Surgeon: Clyde Canterbury, MD;  Location: [facility] ORS;  Service: ENT;  Laterality: Right;   TUBAL LIGATION  04/05/1985   Family History  Problem Relation Age of Onset   Diabetes Mother    Alzheimer's disease Mother    Seizures Mother    Heart disease Father    Breast cancer Neg Hx    Social History   Socioeconomic History   Marital status: Married    Spouse name: Not on file   Number of children: 2   Years of education: Not on  file   Highest education level: Not on file  Occupational History   Not on file  Tobacco Use   Smoking status: Never   Smokeless tobacco: Never  Vaping Use   Vaping Use: Never used  Substance and Sexual Activity   Alcohol use: No    Alcohol/week: 0.0 standard drinks of alcohol   Drug use: No   Sexual activity: Not on file  Other Topics Concern   Not on file  Social History Narrative   Not on file   Social Determinants of Health   Financial Resource Strain: Not on file  Food Insecurity: Not on file  Transportation Needs: Not on file  Physical Activity: Not on file  Stress: Not on file  Social Connections: Not on file     Review of Systems  Constitutional:  Negative for appetite change and unexpected weight change.  HENT:  Negative for congestion and sinus pressure.   Respiratory:  Negative for cough, chest tightness and shortness of breath.   Cardiovascular:  Negative for chest pain, palpitations and leg swelling.  Gastrointestinal:  Negative for abdominal pain, diarrhea, nausea and vomiting.  Genitourinary:  Negative for difficulty urinating and dysuria.  Musculoskeletal:  Negative for joint swelling and myalgias.  Skin:  Negative for color change  and rash.  Neurological:        Light headedness as outlined.  Headache as described.   Psychiatric/Behavioral:  Negative for agitation and dysphoric mood.        Objective:     BP 126/80 (BP Location: Left Arm, Patient Position: Sitting, Cuff Size: Small)   Pulse 68   Temp 97.6 F (36.4 C) (Temporal)   Resp 13   Ht '5\' 2"'$  (1.575 m)   Wt 107 lb 9.6 oz (48.8 kg)   SpO2 98%   BMI 19.68 kg/m  Wt Readings from Last 3 Encounters:  04/19/22 107 lb 9.6 oz (48.8 kg)  02/17/22 109 lb (49.4 kg)  12/14/21 107 lb 6.4 oz (48.7 kg)    Physical Exam Vitals reviewed.  Constitutional:      General: She is not in acute distress.    Appearance: Normal appearance.  HENT:     Head: Normocephalic and atraumatic.     Right  Ear: External ear normal.     Left Ear: External ear normal.  Eyes:     General: No scleral icterus.       Right eye: No discharge.        Left eye: No discharge.     Conjunctiva/sclera: Conjunctivae normal.  Neck:     Thyroid: No thyromegaly.  Cardiovascular:     Rate and Rhythm: Normal rate and regular rhythm.  Pulmonary:     Effort: No respiratory distress.     Breath sounds: Normal breath sounds. No wheezing.  Abdominal:     General: Bowel sounds are normal.     Palpations: Abdomen is soft.     Tenderness: There is no abdominal tenderness.  Musculoskeletal:        General: No swelling or tenderness.     Cervical back: Neck supple. No tenderness.  Lymphadenopathy:     Cervical: No cervical adenopathy.  Skin:    Findings: No erythema or rash.  Neurological:     Mental Status: She is alert.  Psychiatric:        Mood and Affect: Mood normal.        Behavior: Behavior normal.      Outpatient Encounter Medications as of 04/19/2022  Medication Sig   diphenhydrAMINE (BENADRYL) 25 MG tablet Take 25 mg by mouth every 6 (six) hours as needed for allergies.   famotidine (PEPCID) 20 MG tablet Take 1 tablet (20 mg total) by mouth daily. (Patient taking differently: Take 20 mg by mouth every morning.)   latanoprost (XALATAN) 0.005 % ophthalmic solution Place 1 drop into both eyes at bedtime.   metoprolol succinate (TOPROL-XL) 25 MG 24 hr tablet Take 0.5 tablets (12.5 mg total) by mouth daily.   multivitamin-iron-minerals-folic acid (CENTRUM) chewable tablet Chew 1 tablet by mouth daily.   telmisartan (MICARDIS) 80 MG tablet Take 1 tablet (80 mg total) by mouth daily. (Patient taking differently: Take 80 mg by mouth at bedtime.)   timolol (TIMOPTIC) 0.25 % ophthalmic solution Place 1 drop into both eyes every morning.   [DISCONTINUED] HYDROcodone-acetaminophen (NORCO/VICODIN) 5-325 MG tablet Take 1-2 tablets by mouth every 6 (six) hours as needed for moderate pain.   No  facility-administered encounter medications on file as of 04/19/2022.     Lab Results  Component Value Date   WBC 4.5 04/14/2022   HGB 12.4 04/14/2022   HCT 37.9 04/14/2022   PLT 264.0 04/14/2022   GLUCOSE 98 04/14/2022   CHOL 217 (H) 04/14/2022   TRIG 89.0 04/14/2022  HDL 69.10 04/14/2022   LDLDIRECT 119.7 05/30/2012   LDLCALC 130 (H) 04/14/2022   ALT 12 04/14/2022   AST 17 04/14/2022   NA 142 04/14/2022   K 4.2 04/14/2022   CL 105 04/14/2022   CREATININE 0.86 04/14/2022   BUN 11 04/14/2022   CO2 28 04/14/2022   TSH 1.77 04/14/2022    No results found.     Assessment & Plan:  Hypercholesterolemia Assessment & Plan: The 10-year ASCVD risk score (Arnett DK, et al., 2019) is: 8.5%   Values used to calculate the score:     Age: 11 years     Sex: Female     Is Non-Hispanic African American: Yes     Diabetic: No     Tobacco smoker: No     Systolic Blood Pressure: 326 mmHg     Is BP treated: Yes     HDL Cholesterol: 69.1 mg/dL     Total Cholesterol: 217 mg/dL  Discussed calculated cholesterol risk.  Will notify me if desires to start cholesterol medication.    Estrogen deficiency -     DG Bone Density; Future  Encounter for screening mammogram for malignant neoplasm of breast -     3D Screening Mammogram, Left and Right; Future  Degenerative disc disease, cervical Assessment & Plan: Discussed.  Neck exercises.  Follow.  Notify me if desires any further intervention.    Fatty liver Assessment & Plan: Previous ultrasound revealed fatty liver.  Follow liver function tests.     Gastroesophageal reflux disease without esophagitis Assessment & Plan: Continue pepcid.  Follow.    Hypertension, essential Assessment & Plan: Blood pressure as outlined.  On micardis '80mg'$ . Follow pressures.  Follow metabolic panel.    Neck nodule Assessment & Plan: Dr Richardson Landry 02/17/22 - SUBMANDIBULAR GLAND MASS, RIGHT; EXCISION:  - PLEOMORPHIC ADENOMA MEASURING 2.5 CM; MARGINS  APPEAR CLEAR.  - NEGATIVE FOR ATYPIA AND MALIGNANCY.    Stress Assessment & Plan: Appears to be handling things relatively well.  Follow.     Light headedness Assessment & Plan: Blood pressure ok.  Not orthostatic on exam.  Sometimes skips meals. Discussed importance of eating regular meals and staying hydrated.  Slow position changes and movement.  Exercises for her neck.  Follow.  She wants to try these measures first and will notify me if persistent or if desires any further evaluation/intervention.       Einar Pheasant, MD

## 2022-04-24 ENCOUNTER — Encounter: Payer: Self-pay | Admitting: Internal Medicine

## 2022-04-24 DIAGNOSIS — R42 Dizziness and giddiness: Secondary | ICD-10-CM | POA: Insufficient documentation

## 2022-04-24 NOTE — Assessment & Plan Note (Signed)
Appears to be handling things relatively well.  Follow.  

## 2022-04-24 NOTE — Assessment & Plan Note (Signed)
Blood pressure ok.  Not orthostatic on exam.  Sometimes skips meals. Discussed importance of eating regular meals and staying hydrated.  Slow position changes and movement.  Exercises for her neck.  Follow.  She wants to try these measures first and will notify me if persistent or if desires any further evaluation/intervention.

## 2022-04-24 NOTE — Assessment & Plan Note (Signed)
Previous ultrasound revealed fatty liver.  Follow liver function tests.   

## 2022-04-24 NOTE — Assessment & Plan Note (Signed)
Continue pepcid.  Follow.

## 2022-04-24 NOTE — Assessment & Plan Note (Signed)
Blood pressure as outlined.  On micardis '80mg'$ . Follow pressures.  Follow metabolic panel.

## 2022-04-24 NOTE — Assessment & Plan Note (Signed)
Discussed.  Neck exercises.  Follow.  Notify me if desires any further intervention.

## 2022-04-24 NOTE — Assessment & Plan Note (Signed)
Dr Richardson Landry 02/17/22 - SUBMANDIBULAR GLAND MASS, RIGHT; EXCISION:  - PLEOMORPHIC ADENOMA MEASURING 2.5 CM; MARGINS APPEAR CLEAR.  - NEGATIVE FOR ATYPIA AND MALIGNANCY.

## 2022-06-18 ENCOUNTER — Ambulatory Visit: Payer: 59 | Admitting: Internal Medicine

## 2022-06-25 ENCOUNTER — Ambulatory Visit: Payer: 59 | Admitting: Internal Medicine

## 2022-06-25 VITALS — BP 126/78 | HR 63 | Temp 98.0°F | Resp 16 | Ht 62.0 in | Wt 111.8 lb

## 2022-06-25 DIAGNOSIS — D72820 Lymphocytosis (symptomatic): Secondary | ICD-10-CM

## 2022-06-25 DIAGNOSIS — I10 Essential (primary) hypertension: Secondary | ICD-10-CM

## 2022-06-25 DIAGNOSIS — F439 Reaction to severe stress, unspecified: Secondary | ICD-10-CM

## 2022-06-25 DIAGNOSIS — E7800 Pure hypercholesterolemia, unspecified: Secondary | ICD-10-CM

## 2022-06-25 DIAGNOSIS — K219 Gastro-esophageal reflux disease without esophagitis: Secondary | ICD-10-CM

## 2022-06-25 DIAGNOSIS — Z86010 Personal history of colonic polyps: Secondary | ICD-10-CM

## 2022-06-25 DIAGNOSIS — K760 Fatty (change of) liver, not elsewhere classified: Secondary | ICD-10-CM | POA: Diagnosis not present

## 2022-06-25 DIAGNOSIS — R42 Dizziness and giddiness: Secondary | ICD-10-CM

## 2022-06-25 NOTE — Progress Notes (Unsigned)
Subjective:    Patient ID: Angel Taylor, female    DOB: 1955/12/31, 67 y.o.   MRN: [id number]  Patient here for No chief complaint on file.   HPI Here to follow up regarding her blood pressure, cholesterol, increased stress and reflux.  Light headedness reported previously   Past Medical History:  Diagnosis Date   Anemia    Chronic headaches    DDD (degenerative disc disease), cervical    Fatty liver    Frequent UTI    GERD (gastroesophageal reflux disease)    Glaucoma (increased eye pressure)    Hypercholesterolemia    Pre-eclampsia    Submandibular gland mass    right   Past Surgical History:  Procedure Laterality Date   ABDOMINAL HYSTERECTOMY     total   CESAREAN SECTION  04/05/1985   COLONOSCOPY     MASS EXCISION Right 02/17/2022   Procedure: EXCISION SUBMANDIBULAR MASS;  Surgeon: Clyde Canterbury, MD;  Location: [facility] OR[facility];  Service: ENT;  Laterality: Right;   TUBAL LIGATION  04/05/1985   Family History  Problem Relation Age of Onset   Diabetes Mother    Alzheimer's disease Mother    Seizures Mother    Heart disease Father    Breast cancer Neg Hx    Social History   Socioeconomic History   Marital status: Married    Spouse name: Not on file   Number of children: 2   Years of education: Not on file   Highest education level: Not on file  Occupational History   Not on file  Tobacco Use   Smoking status: Never   Smokeless tobacco: Never  Vaping Use   Vaping Use: Never used  Substance and Sexual Activity   Alcohol use: No    Alcohol/week: 0.0 standard drinks of alcohol   Drug use: No   Sexual activity: Not on file  Other Topics Concern   Not on file  Social History Narrative   Not on file   Social Determinants of Health   Financial Resource Strain: Low Risk  (06/23/2022)   Overall Financial Resource Strain (CARDIA)    Difficulty of Paying Living Expenses: Not very hard  Food Insecurity: No Food Insecurity (06/23/2022)   Hunger Vital Sign     Worried About Running Out of Food in the Last Year: Never true    [place] in the Last Year: Never true  Transportation Needs: No Transportation Needs (06/23/2022)   PRAPARE - Hydrologist (Medical): No    Lack of Transportation (Non-Medical): No  Physical Activity: Insufficiently Active (06/23/2022)   Exercise Vital Sign    Days of Exercise per Week: 3 days    Minutes of Exercise per Session: 20 min  Stress: No Stress Concern Present (06/23/2022)   [place]    Feeling of Stress : Only a little  Social Connections: Socially Integrated (06/23/2022)   Social Connection and Isolation Panel [NHANES]    Frequency of Communication with Friends and Family: Three times a week    Frequency of Social Gatherings with Friends and Family: Once a week    Attends Religious Services: More than 4 times per year    Active Member of [employer] or Organizations: Yes    Attends Music therapist: More than 4 times per year    Marital Status: Married     Review of Systems     Objective:  There were no vitals taken for this visit. Wt Readings from Last 3 Encounters:  04/19/22 107 lb 9.6 oz (48.8 kg)  02/17/22 109 lb (49.4 kg)  12/14/21 107 lb 6.4 oz (48.7 kg)    Physical Exam   Outpatient Encounter Medications as of 06/25/2022  Medication Sig   diphenhydrAMINE (BENADRYL) 25 MG tablet Take 25 mg by mouth every 6 (six) hours as needed for allergies.   famotidine (PEPCID) 20 MG tablet Take 1 tablet (20 mg total) by mouth daily. (Patient taking differently: Take 20 mg by mouth every morning.)   latanoprost (XALATAN) 0.005 % ophthalmic solution Place 1 drop into both eyes at bedtime.   metoprolol succinate (TOPROL-XL) 25 MG 24 hr tablet Take 0.5 tablets (12.5 mg total) by mouth daily.   multivitamin-iron-minerals-folic acid (CENTRUM) chewable tablet Chew 1 tablet by mouth daily.    telmisartan (MICARDIS) 80 MG tablet Take 1 tablet (80 mg total) by mouth daily. (Patient taking differently: Take 80 mg by mouth at bedtime.)   timolol (TIMOPTIC) 0.25 % ophthalmic solution Place 1 drop into both eyes every morning.   No facility-administered encounter medications on file as of 06/25/2022.     Lab Results  Component Value Date   WBC 4.5 04/14/2022   HGB 12.4 04/14/2022   HCT 37.9 04/14/2022   PLT 264.0 04/14/2022   GLUCOSE 98 04/14/2022   CHOL 217 (H) 04/14/2022   TRIG 89.0 04/14/2022   HDL 69.10 04/14/2022   LDLDIRECT 119.7 05/30/2012   LDLCALC 130 (H) 04/14/2022   ALT 12 04/14/2022   AST 17 04/14/2022   NA 142 04/14/2022   K 4.2 04/14/2022   CL 105 04/14/2022   CREATININE 0.86 04/14/2022   BUN 11 04/14/2022   CO2 28 04/14/2022   TSH 1.77 04/14/2022    No results found.     Assessment & Plan:  There are no diagnoses linked to this encounter.   Einar Pheasant, MD

## 2022-06-26 ENCOUNTER — Encounter: Payer: Self-pay | Admitting: Internal Medicine

## 2022-06-26 NOTE — Assessment & Plan Note (Signed)
Previous ultrasound revealed fatty liver.  Follow liver function tests.   

## 2022-06-26 NOTE — Assessment & Plan Note (Signed)
Follow cbc.  

## 2022-06-26 NOTE — Assessment & Plan Note (Signed)
Blood pressure as outlined.  On micardis 80mg. Follow pressures.  Follow metabolic panel.  

## 2022-06-26 NOTE — Assessment & Plan Note (Signed)
Appears to be handling things relatively well.  Follow.  

## 2022-06-26 NOTE — Assessment & Plan Note (Signed)
The 10-year ASCVD risk score (Arnett DK, et al., 2019) is: 9%   Values used to calculate the score:     Age: 67 years     Sex: Female     Is Non-Hispanic African American: Yes     Diabetic: No     Tobacco smoker: No     Systolic Blood Pressure: 123XX123 mmHg     Is BP treated: Yes     HDL Cholesterol: 69.1 mg/dL     Total Cholesterol: 217 mg/dL  Discussed calculated cholesterol risk.  Will notify me if desires to start cholesterol medication.

## 2022-06-26 NOTE — Assessment & Plan Note (Signed)
Not an issue now.  Follow.    

## 2022-06-26 NOTE — Assessment & Plan Note (Signed)
Continue pepcid.  Follow.  

## 2022-06-26 NOTE — Assessment & Plan Note (Signed)
Colonoscopy 06/2018.  

## 2022-06-28 ENCOUNTER — Ambulatory Visit
Admission: RE | Admit: 2022-06-28 | Discharge: 2022-06-28 | Disposition: A | Discharge status: Home / Self Care | Payer: 59 | Source: Ambulatory Visit | Attending: Internal Medicine | Admitting: Internal Medicine

## 2022-06-28 DIAGNOSIS — E2839 Other primary ovarian failure: Secondary | ICD-10-CM | POA: Diagnosis present

## 2022-06-28 DIAGNOSIS — Z1231 Encounter for screening mammogram for malignant neoplasm of breast: Secondary | ICD-10-CM

## 2022-06-29 ENCOUNTER — Telehealth: Payer: Self-pay

## 2022-06-29 NOTE — Telephone Encounter (Signed)
-----   Message from Einar Pheasant, MD sent at 06/29/2022  6:46 AM EDT ----- Notify - bone density reveals osteoporosis.  Can schedule appt to discuss treatment options.

## 2022-06-29 NOTE — Telephone Encounter (Signed)
LMTCB. Needs appt with Dr Nicki Reaper to discuss bone density and treatment options for osteoporosis

## 2022-07-17 ENCOUNTER — Other Ambulatory Visit: Payer: Self-pay | Admitting: Internal Medicine

## 2022-07-20 ENCOUNTER — Encounter: Payer: Self-pay | Admitting: Internal Medicine

## 2022-07-20 ENCOUNTER — Ambulatory Visit: Payer: 59 | Admitting: Internal Medicine

## 2022-07-20 VITALS — BP 118/68 | HR 61 | Temp 98.2°F | Resp 16 | Ht 62.0 in | Wt 109.0 lb

## 2022-07-20 DIAGNOSIS — F439 Reaction to severe stress, unspecified: Secondary | ICD-10-CM | POA: Diagnosis not present

## 2022-07-20 DIAGNOSIS — I10 Essential (primary) hypertension: Secondary | ICD-10-CM

## 2022-07-20 DIAGNOSIS — M810 Age-related osteoporosis without current pathological fracture: Secondary | ICD-10-CM

## 2022-07-20 MED ORDER — FAMOTIDINE 20 MG PO TABS: 20.0000 mg | ORAL_TABLET | Freq: Every day | ORAL | 3 refills | Status: DC

## 2022-07-20 MED ORDER — TELMISARTAN 80 MG PO TABS: 80.0000 mg | ORAL_TABLET | Freq: Every day | ORAL | 3 refills | Status: DC

## 2022-07-20 NOTE — Progress Notes (Signed)
Subjective:    Patient ID: Angel Taylor, female    DOB: [birth year], 67 y.o.   MRN: [id number]  Patient here for  Chief Complaint  Patient presents with   Osteoporosis    HPI Here as a work in appt to discuss osteoporosis treatment.  Discussed bone density results.  Discussed treatment options including oral bisphosphonates, reclast and prolia.  Discussed possible side effects of medication. Also discussed taking calcium and vitamin D and weight bearing exercise.  No acid reflux reported.  No swallowing problems.     Past Medical History:  Diagnosis Date   Anemia    Chronic headaches    DDD (degenerative disc disease), cervical    Fatty liver    Frequent UTI    GERD (gastroesophageal reflux disease)    Glaucoma (increased eye pressure)    Hypercholesterolemia    Pre-eclampsia    Submandibular gland mass    right   Past Surgical History:  Procedure Laterality Date   ABDOMINAL HYSTERECTOMY     total   CESAREAN SECTION  04/05/1985   COLONOSCOPY     MASS EXCISION Right 02/17/2022   Procedure: EXCISION SUBMANDIBULAR MASS;  Surgeon: Geanie Logan, MD;  Location: [facility] ORS;  Service: ENT;  Laterality: Right;   TUBAL LIGATION  04/05/1985   Family History  Problem Relation Age of Onset   Diabetes Mother    Alzheimer's disease Mother    Seizures Mother    Heart disease Father    Breast cancer Neg Hx    Social History   Socioeconomic History   Marital status: Married    Spouse name: Not on file   Number of children: 2   Years of education: Not on file   Highest education level: Not on file  Occupational History   Not on file  Tobacco Use   Smoking status: Never   Smokeless tobacco: Never  Vaping Use   Vaping Use: Never used  Substance and Sexual Activity   Alcohol use: No    Alcohol/week: 0.0 standard drinks of alcohol   Drug use: No   Sexual activity: Not on file  Other Topics Concern   Not on file  Social History Narrative   Not on file   Social  Determinants of Health   Financial Resource Strain: Low Risk  (06/23/2022)   Overall Financial Resource Strain (CARDIA)    Difficulty of Paying Living Expenses: Not very hard  Food Insecurity: No Food Insecurity (06/23/2022)   Hunger Vital Sign    Worried About Running Out of Food in the Last Year: Never true    Ran Out of Food in the Last Year: Never true  Transportation Needs: No Transportation Needs (06/23/2022)   PRAPARE - Administrator, Civil Service (Medical): No    Lack of Transportation (Non-Medical): No  Physical Activity: Insufficiently Active (06/23/2022)   Exercise Vital Sign    Days of Exercise per Week: 3 days    Minutes of Exercise per Session: 20 min  Stress: No Stress Concern Present (06/23/2022)   [employer] of Occupational Health - Occupational Stress Questionnaire    Feeling of Stress : Only a little  Social Connections: Socially Integrated (06/23/2022)   Social Connection and Isolation Panel [NHANES]    Frequency of Communication with Friends and Family: Three times a week    Frequency of Social Gatherings with Friends and Family: Once a week    Attends Religious Services: More than 4 times per year  Active Member of Clubs or Organizations: Yes    Attends Banker Meetings: More than 4 times per year    Marital Status: Married     Review of Systems  Constitutional:  Negative for appetite change and unexpected weight change.  HENT:  Negative for congestion and sinus pressure.   Respiratory:  Negative for cough, chest tightness and shortness of breath.   Cardiovascular:  Negative for chest pain and palpitations.  Gastrointestinal:  Negative for abdominal pain, diarrhea, nausea and vomiting.  Genitourinary:  Negative for difficulty urinating and dysuria.  Musculoskeletal:  Negative for joint swelling and myalgias.  Skin:  Negative for color change and rash.  Neurological:  Negative for dizziness and headaches.   Psychiatric/Behavioral:  Negative for agitation and dysphoric mood.        Objective:     BP 118/68   Pulse 61   Temp 98.2 F (36.8 C)   Resp 16   Ht 5\' 2"  (1.575 m)   Wt 109 lb (49.4 kg)   SpO2 99%   BMI 19.94 kg/m  Wt Readings from Last 3 Encounters:  07/20/22 109 lb (49.4 kg)  06/25/22 111 lb 12.8 oz (50.7 kg)  04/19/22 107 lb 9.6 oz (48.8 kg)    Physical Exam Vitals reviewed.  Constitutional:      General: She is not in acute distress.    Appearance: Normal appearance.  HENT:     Head: Normocephalic and atraumatic.     Right Ear: External ear normal.     Left Ear: External ear normal.  Eyes:     General: No scleral icterus.       Right eye: No discharge.        Left eye: No discharge.     Conjunctiva/sclera: Conjunctivae normal.  Neck:     Thyroid: No thyromegaly.  Cardiovascular:     Rate and Rhythm: Normal rate and regular rhythm.  Pulmonary:     Effort: No respiratory distress.     Breath sounds: Normal breath sounds. No wheezing.  Abdominal:     General: Bowel sounds are normal.     Palpations: Abdomen is soft.     Tenderness: There is no abdominal tenderness.  Musculoskeletal:        General: No swelling or tenderness.     Cervical back: Neck supple. No tenderness.  Lymphadenopathy:     Cervical: No cervical adenopathy.  Skin:    Findings: No erythema or rash.  Neurological:     Mental Status: She is alert.  Psychiatric:        Mood and Affect: Mood normal.        Behavior: Behavior normal.      Outpatient Encounter Medications as of 07/20/2022  Medication Sig   diphenhydrAMINE (BENADRYL) 25 MG tablet Take 25 mg by mouth every 6 (six) hours as needed for allergies.   famotidine (PEPCID) 20 MG tablet Take 1 tablet (20 mg total) by mouth daily.   latanoprost (XALATAN) 0.005 % ophthalmic solution Place 1 drop into both eyes at bedtime.   metoprolol succinate (TOPROL-XL) 25 MG 24 hr tablet TAKE 1/2 TABLET BY MOUTH DAILY    multivitamin-iron-minerals-folic acid (CENTRUM) chewable tablet Chew 1 tablet by mouth daily.   telmisartan (MICARDIS) 80 MG tablet Take 1 tablet (80 mg total) by mouth daily.   timolol (TIMOPTIC) 0.25 % ophthalmic solution Place 1 drop into both eyes every morning.   [DISCONTINUED] famotidine (PEPCID) 20 MG tablet Take 1 tablet (20 mg  total) by mouth daily. (Patient taking differently: Take 20 mg by mouth every morning.)   [DISCONTINUED] telmisartan (MICARDIS) 80 MG tablet Take 1 tablet (80 mg total) by mouth daily. (Patient taking differently: Take 80 mg by mouth at bedtime.)   No facility-administered encounter medications on file as of 07/20/2022.     Lab Results  Component Value Date   WBC 4.5 04/14/2022   HGB 12.4 04/14/2022   HCT 37.9 04/14/2022   PLT 264.0 04/14/2022   GLUCOSE 98 04/14/2022   CHOL 217 (H) 04/14/2022   TRIG 89.0 04/14/2022   HDL 69.10 04/14/2022   LDLDIRECT 119.7 05/30/2012   LDLCALC 130 (H) 04/14/2022   ALT 12 04/14/2022   AST 17 04/14/2022   NA 142 04/14/2022   K 4.2 04/14/2022   CL 105 04/14/2022   CREATININE 0.86 04/14/2022   BUN 11 04/14/2022   CO2 28 04/14/2022   TSH 1.77 04/14/2022    MM 3D SCREEN BREAST BILATERAL  Result Date: 06/29/2022 CLINICAL DATA:  Screening. EXAM: DIGITAL SCREENING BILATERAL MAMMOGRAM WITH TOMOSYNTHESIS AND CAD TECHNIQUE: Bilateral screening digital craniocaudal and mediolateral oblique mammograms were obtained. Bilateral screening digital breast tomosynthesis was performed. The images were evaluated with computer-aided detection. COMPARISON:  Previous exam(s). ACR Breast Density Category c: The breasts are heterogeneously dense, which may obscure small masses. FINDINGS: There are no findings suspicious for malignancy. IMPRESSION: No mammographic evidence of malignancy. A result letter of this screening mammogram will be mailed directly to the patient. RECOMMENDATION: Screening mammogram in one year. (Code:SM-B-01Y) BI-RADS  CATEGORY  1: Negative. Electronically Signed   By: Bary Richard M.D.   On: 06/29/2022 13:59   DG Bone Density  Result Date: 06/28/2022 EXAM: DUAL X-RAY ABSORPTIOMETRY (DXA) FOR BONE MINERAL DENSITY IMPRESSION: Your patient Kyarra Vancamp completed a BMD test on 06/28/2022 using the [employer] iDXA DXA System (software version: 14.10) manufactured by [employer]. The following summarizes the results of our evaluation. Technologist: [facility] PATIENT BIOGRAPHICAL: Name: Aaralynn, Shepheard Patient ID: [id number] Birth Date: Mar 15, 1956 Height: 62.0 in. Gender: Female Exam Date: 06/28/2022 Weight: 108.3 lbs. Indications: Hysterectomy, Postmenopausal Fractures: Treatments: Multi-Vitamin DENSITOMETRY RESULTS: Site      Region    Measured Date Measured Age WHO Classification Young Adult T-score BMD         %Change vs. Previous Significant Change (*) AP Spine L1-L4 06/28/2022 65.3 Osteoporosis -2.8 0.849 g/cm2 DualFemur Neck Left 06/28/2022 65.3 Osteoporosis -2.6 0.673 g/cm2 ASSESSMENT: The BMD measured at AP Spine L1-L4 is 0.849 g/cm2 with a T-score of -2.8. This patient is considered osteoporotic according to World Health Organization [facility]) criteria. The scan quality is good. World Science writer [facility]) criteria for post-menopausal, Caucasian Women: Normal:                   T-score at or above -1 SD Osteopenia/low bone mass: T-score between -1 and -2.5 SD Osteoporosis:             T-score at or below -2.5 SD RECOMMENDATIONS: 1. All patients should optimize calcium and vitamin D intake. 2. Consider FDA-approved medical therapies in postmenopausal women and men aged 22 years and older, based on the following: a. A hip or vertebral(clinical or morphometric) fracture b. T-score < -2.5 at the femoral neck or spine after appropriate evaluation to exclude secondary causes c. Low bone mass (T-score between -1.0 and -2.5 at the femoral neck or spine) and a 10-year probability of a hip fracture > 3% or a 10-year probability of  a major  osteoporosis-related fracture > 20% based on the [place]-adapted WHO algorithm 3. Clinician judgment and/or patient preferences may indicate treatment for people with 10-year fracture probabilities above or below these levels FOLLOW-UP: People with diagnosed cases of osteoporosis or at high risk for fracture should have regular bone mineral density tests. For patients eligible for Medicare, routine testing is allowed once every 2 years. The testing frequency can be increased to one year for patients who have rapidly progressing disease, those who are receiving or discontinuing medical therapy to restore bone mass, or have additional risk factors. I have reviewed this report, and agree with the above findings. [facility] Electronically Signed   By: Bary Richard M.D.   On: 06/28/2022 14:24       Assessment & Plan:  Hypertension, essential Assessment & Plan: Blood pressure as outlined.  On micardis 80mg . Follow pressures.  Follow metabolic panel.    Stress Assessment & Plan: Appears to be handling things relatively well.  Follow.     Osteoporosis without current pathological fracture, unspecified osteoporosis type Assessment & Plan: Discussed recent bone density results.  Discussed osteoporosis.  Discussed treatment options as outlined.   Will start calcium, vitamin D and weight bearing exercise.  Will think about prescription medication.  Follow.    Other orders -     Famotidine; Take 1 tablet (20 mg total) by mouth daily.  Dispense: 90 tablet; Refill: 3 -     Telmisartan; Take 1 tablet (80 mg total) by mouth daily.  Dispense: 90 tablet; Refill: 3     Dale Boling, MD

## 2022-07-20 NOTE — Patient Instructions (Signed)
Caltrate  plus D - take one tablet twice a day  Examples of probiotics:  culturelle, align or florastor  Names of the medications we discussed today (to treat osteoporosis) - boniva, actonel or fosamax

## 2022-07-25 ENCOUNTER — Encounter: Payer: Self-pay | Admitting: Internal Medicine

## 2022-07-25 DIAGNOSIS — M810 Age-related osteoporosis without current pathological fracture: Secondary | ICD-10-CM | POA: Insufficient documentation

## 2022-07-25 NOTE — Assessment & Plan Note (Signed)
Blood pressure as outlined.  On micardis 80mg. Follow pressures.  Follow metabolic panel.  

## 2022-07-25 NOTE — Assessment & Plan Note (Signed)
Appears to be handling things relatively well.  Follow.  

## 2022-07-25 NOTE — Assessment & Plan Note (Signed)
Discussed recent bone density results.  Discussed osteoporosis.  Discussed treatment options as outlined.   Will start calcium, vitamin D and weight bearing exercise.  Will think about prescription medication.  Follow.

## 2022-09-24 ENCOUNTER — Other Ambulatory Visit (INDEPENDENT_AMBULATORY_CARE_PROVIDER_SITE_OTHER): Payer: 59

## 2022-09-24 DIAGNOSIS — E7800 Pure hypercholesterolemia, unspecified: Secondary | ICD-10-CM | POA: Diagnosis not present

## 2022-09-24 LAB — BASIC METABOLIC PANEL
BUN: 20 mg/dL (ref 6–23)
CO2: 29 mEq/L (ref 19–32)
Calcium: 9.3 mg/dL (ref 8.4–10.5)
Chloride: 103 mEq/L (ref 96–112)
Creatinine, Ser: 1.04 mg/dL (ref 0.40–1.20)
GFR: 56.39 mL/min — ABNORMAL LOW (ref >60.00)
Glucose, Bld: 91 mg/dL (ref 70–99)
Potassium: 4.6 mEq/L (ref 3.5–5.1)
Sodium: 138 mEq/L (ref 135–145)

## 2022-09-24 LAB — LIPID PANEL
Cholesterol: 211 mg/dL — ABNORMAL HIGH (ref 0–200)
HDL: 72.5 mg/dL (ref >39.00)
LDL Cholesterol: 124 mg/dL — ABNORMAL HIGH (ref 0–99)
NonHDL: 138.5
Total CHOL/HDL Ratio: 3
Triglycerides: 73 mg/dL (ref 0.0–149.0)
VLDL: 14.6 mg/dL (ref 0.0–40.0)

## 2022-09-24 LAB — HEPATIC FUNCTION PANEL
ALT: 14 U/L (ref 0–35)
AST: 18 U/L (ref 0–37)
Albumin: 3.9 g/dL (ref 3.5–5.2)
Alkaline Phosphatase: 44 U/L (ref 39–117)
Bilirubin, Direct: 0.1 mg/dL (ref 0.0–0.3)
Total Bilirubin: 0.6 mg/dL (ref 0.2–1.2)
Total Protein: 7 g/dL (ref 6.0–8.3)

## 2022-09-28 ENCOUNTER — Ambulatory Visit: Payer: 59 | Admitting: Internal Medicine

## 2022-09-28 ENCOUNTER — Encounter: Payer: Self-pay | Admitting: Internal Medicine

## 2022-09-28 VITALS — BP 122/74 | HR 65 | Temp 97.9°F | Resp 16 | Ht 62.0 in | Wt 109.6 lb

## 2022-09-28 DIAGNOSIS — Z86010 Personal history of colonic polyps: Secondary | ICD-10-CM

## 2022-09-28 DIAGNOSIS — E7800 Pure hypercholesterolemia, unspecified: Secondary | ICD-10-CM | POA: Diagnosis not present

## 2022-09-28 DIAGNOSIS — K760 Fatty (change of) liver, not elsewhere classified: Secondary | ICD-10-CM

## 2022-09-28 DIAGNOSIS — Z1211 Encounter for screening for malignant neoplasm of colon: Secondary | ICD-10-CM

## 2022-09-28 DIAGNOSIS — F439 Reaction to severe stress, unspecified: Secondary | ICD-10-CM

## 2022-09-28 DIAGNOSIS — I10 Essential (primary) hypertension: Secondary | ICD-10-CM

## 2022-09-28 DIAGNOSIS — R944 Abnormal results of kidney function studies: Secondary | ICD-10-CM | POA: Diagnosis not present

## 2022-09-28 MED ORDER — METOPROLOL SUCCINATE ER 25 MG PO TB24: 12.5000 mg | ORAL_TABLET | Freq: Every day | ORAL | 3 refills | Status: DC

## 2022-09-28 NOTE — Progress Notes (Signed)
Subjective:    Patient ID: Angel Taylor, female    DOB: 1955/07/22, 67 y.o.   MRN: [id number]  Patient here for  Chief Complaint  Patient presents with   Medical Management of Chronic Issues    HPI Here to follow up regarding hypertension and hypercholesterolemia.  She reports she is doing well.  Handling stress. Better now that school is out.  Stays active.  No chest pain or sob reported.  No abdominal pain or bowel change reported.  Discussed labs.  Discussed calculated cholesterol risk and recommendation to start cholesterol medication.  She will notify me if decides to start.  Discussed calcium score.     Past Medical History:  Diagnosis Date   Anemia    Chronic headaches    DDD (degenerative disc disease), cervical    Fatty liver    Frequent UTI    GERD (gastroesophageal reflux disease)    Glaucoma (increased eye pressure)    Hypercholesterolemia    Pre-eclampsia    Submandibular gland mass    right   Past Surgical History:  Procedure Laterality Date   ABDOMINAL HYSTERECTOMY     total   CESAREAN SECTION  04/05/1985   COLONOSCOPY     MASS EXCISION Right 02/17/2022   Procedure: EXCISION SUBMANDIBULAR MASS;  Surgeon: Geanie Logan, MD;  Location: [facility] ORS;  Service: ENT;  Laterality: Right;   TUBAL LIGATION  04/05/1985   Family History  Problem Relation Age of Onset   Diabetes Mother    Alzheimer's disease Mother    Seizures Mother    Heart disease Father    Breast cancer Neg Hx    Social History   Socioeconomic History   Marital status: Married    Spouse name: Not on file   Number of children: 2   Years of education: Not on file   Highest education level: Not on file  Occupational History   Not on file  Tobacco Use   Smoking status: Never   Smokeless tobacco: Never  Vaping Use   Vaping Use: Never used  Substance and Sexual Activity   Alcohol use: No    Alcohol/week: 0.0 standard drinks of alcohol   Drug use: No   Sexual activity: Not on file   Other Topics Concern   Not on file  Social History Narrative   Not on file   Social Determinants of Health   Financial Resource Strain: Low Risk  (06/23/2022)   Overall Financial Resource Strain (CARDIA)    Difficulty of Paying Living Expenses: Not very hard  Food Insecurity: No Food Insecurity (06/23/2022)   Hunger Vital Sign    Worried About Running Out of Food in the Last Year: Never true    Ran Out of Food in the Last Year: Never true  Transportation Needs: No Transportation Needs (06/23/2022)   PRAPARE - Administrator, Civil Service (Medical): No    Lack of Transportation (Non-Medical): No  Physical Activity: Insufficiently Active (06/23/2022)   Exercise Vital Sign    Days of Exercise per Week: 3 days    Minutes of Exercise per Session: 20 min  Stress: No Stress Concern Present (06/23/2022)   [employer] of Occupational Health - Occupational Stress Questionnaire    Feeling of Stress : Only a little  Social Connections: Socially Integrated (06/23/2022)   Social Connection and Isolation Panel [NHANES]    Frequency of Communication with Friends and Family: Three times a week    Frequency of Social Gatherings  with Friends and Family: Once a week    Attends Religious Services: More than 4 times per year    Active Member of Clubs or Organizations: Yes    Attends Engineer, structural: More than 4 times per year    Marital Status: Married     Review of Systems  Constitutional:  Negative for appetite change and unexpected weight change.  HENT:  Negative for congestion and sinus pressure.   Respiratory:  Negative for cough, chest tightness and shortness of breath.   Cardiovascular:  Negative for chest pain, palpitations and leg swelling.  Gastrointestinal:  Negative for abdominal pain, diarrhea, nausea and vomiting.  Genitourinary:  Negative for difficulty urinating and dysuria.  Musculoskeletal:  Negative for joint swelling and myalgias.  Skin:  Negative  for color change and rash.  Neurological:  Negative for dizziness and headaches.  Psychiatric/Behavioral:  Negative for agitation and dysphoric mood.        Objective:     BP 122/74   Pulse 65   Temp 97.9 F (36.6 C)   Resp 16   Ht 5\' 2"  (1.575 m)   Wt 109 lb 9.6 oz (49.7 kg)   SpO2 99%   BMI 20.05 kg/m  Wt Readings from Last 3 Encounters:  09/28/22 109 lb 9.6 oz (49.7 kg)  07/20/22 109 lb (49.4 kg)  06/25/22 111 lb 12.8 oz (50.7 kg)    Physical Exam Vitals reviewed.  Constitutional:      General: She is not in acute distress.    Appearance: Normal appearance. She is well-developed.  HENT:     Head: Normocephalic and atraumatic.     Right Ear: External ear normal.     Left Ear: External ear normal.  Eyes:     General: No scleral icterus.       Right eye: No discharge.        Left eye: No discharge.     Conjunctiva/sclera: Conjunctivae normal.  Neck:     Thyroid: No thyromegaly.  Cardiovascular:     Rate and Rhythm: Normal rate and regular rhythm.  Pulmonary:     Effort: No tachypnea, accessory muscle usage or respiratory distress.     Breath sounds: Normal breath sounds. No decreased breath sounds or wheezing.  Chest:  Breasts:    Right: No inverted nipple, mass, nipple discharge or tenderness (no axillary adenopathy).     Left: No inverted nipple, mass, nipple discharge or tenderness (no axilarry adenopathy).  Abdominal:     General: Bowel sounds are normal.     Palpations: Abdomen is soft.     Tenderness: There is no abdominal tenderness.  Musculoskeletal:        General: No swelling or tenderness.     Cervical back: Neck supple. No tenderness.  Lymphadenopathy:     Cervical: No cervical adenopathy.  Skin:    Findings: No erythema or rash.  Neurological:     Mental Status: She is alert and oriented to person, place, and time.  Psychiatric:        Mood and Affect: Mood normal.        Behavior: Behavior normal.      Outpatient Encounter  Medications as of 09/28/2022  Medication Sig   Calcium-Vitamin D-Vitamin K (VIACTIV CALCIUM PLUS D) 650-12.5-40 MG-MCG-MCG CHEW Chew 2 capsules by mouth daily.   diphenhydrAMINE (BENADRYL) 25 MG tablet Take 25 mg by mouth every 6 (six) hours as needed for allergies.   famotidine (PEPCID) 20 MG tablet Take  1 tablet (20 mg total) by mouth daily.   latanoprost (XALATAN) 0.005 % ophthalmic solution Place 1 drop into both eyes at bedtime.   multivitamin-iron-minerals-folic acid (CENTRUM) chewable tablet Chew 1 tablet by mouth daily.   telmisartan (MICARDIS) 80 MG tablet Take 1 tablet (80 mg total) by mouth daily.   timolol (TIMOPTIC) 0.25 % ophthalmic solution Place 1 drop into both eyes every morning.   [DISCONTINUED] metoprolol succinate (TOPROL-XL) 25 MG 24 hr tablet TAKE 1/2 TABLET BY MOUTH DAILY   metoprolol succinate (TOPROL-XL) 25 MG 24 hr tablet Take 0.5 tablets (12.5 mg total) by mouth daily.   No facility-administered encounter medications on file as of 09/28/2022.     Lab Results  Component Value Date   WBC 4.5 04/14/2022   HGB 12.4 04/14/2022   HCT 37.9 04/14/2022   PLT 264.0 04/14/2022   GLUCOSE 91 09/24/2022   CHOL 211 (H) 09/24/2022   TRIG 73.0 09/24/2022   HDL 72.50 09/24/2022   LDLDIRECT 119.7 05/30/2012   LDLCALC 124 (H) 09/24/2022   ALT 14 09/24/2022   AST 18 09/24/2022   NA 138 09/24/2022   K 4.6 09/24/2022   CL 103 09/24/2022   CREATININE 1.04 09/24/2022   BUN 20 09/24/2022   CO2 29 09/24/2022   TSH 1.77 04/14/2022    MM 3D SCREEN BREAST BILATERAL  Result Date: 06/29/2022 CLINICAL DATA:  Screening. EXAM: DIGITAL SCREENING BILATERAL MAMMOGRAM WITH TOMOSYNTHESIS AND CAD TECHNIQUE: Bilateral screening digital craniocaudal and mediolateral oblique mammograms were obtained. Bilateral screening digital breast tomosynthesis was performed. The images were evaluated with computer-aided detection. COMPARISON:  Previous exam(s). ACR Breast Density Category c: The breasts  are heterogeneously dense, which may obscure small masses. FINDINGS: There are no findings suspicious for malignancy. IMPRESSION: No mammographic evidence of malignancy. A result letter of this screening mammogram will be mailed directly to the patient. RECOMMENDATION: Screening mammogram in one year. (Code:SM-B-01Y) BI-RADS CATEGORY  1: Negative. Electronically Signed   By: Bary Richard M.D.   On: 06/29/2022 13:59   DG Bone Density  Result Date: 06/28/2022 EXAM: DUAL X-RAY ABSORPTIOMETRY (DXA) FOR BONE MINERAL DENSITY IMPRESSION: Your patient Taraja Shanor completed a BMD test on 06/28/2022 using the [employer] iDXA DXA System (software version: 14.10) manufactured by [employer]. The following summarizes the results of our evaluation. Technologist: [facility] PATIENT BIOGRAPHICAL: Name: Estefana, Okabe Patient ID: [id number] Birth Date: 1955/10/07 Height: 62.0 in. Gender: Female Exam Date: 06/28/2022 Weight: 108.3 lbs. Indications: Hysterectomy, Postmenopausal Fractures: Treatments: Multi-Vitamin DENSITOMETRY RESULTS: Site      Region    Measured Date Measured Age WHO Classification Young Adult T-score BMD         %Change vs. Previous Significant Change (*) AP Spine L1-L4 06/28/2022 65.3 Osteoporosis -2.8 0.849 g/cm2 DualFemur Neck Left 06/28/2022 65.3 Osteoporosis -2.6 0.673 g/cm2 ASSESSMENT: The BMD measured at AP Spine L1-L4 is 0.849 g/cm2 with a T-score of -2.8. This patient is considered osteoporotic according to World Health Organization [facility]) criteria. The scan quality is good. World Science writer [facility]) criteria for post-menopausal, Caucasian Women: Normal:                   T-score at or above -1 SD Osteopenia/low bone mass: T-score between -1 and -2.5 SD Osteoporosis:             T-score at or below -2.5 SD RECOMMENDATIONS: 1. All patients should optimize calcium and vitamin D intake. 2. Consider FDA-approved medical therapies in postmenopausal women and men  aged 15 years and older, based on the  following: a. A hip or vertebral(clinical or morphometric) fracture b. T-score < -2.5 at the femoral neck or spine after appropriate evaluation to exclude secondary causes c. Low bone mass (T-score between -1.0 and -2.5 at the femoral neck or spine) and a 10-year probability of a hip fracture > 3% or a 10-year probability of a major osteoporosis-related fracture > 20% based on the [place]-adapted WHO algorithm 3. Clinician judgment and/or patient preferences may indicate treatment for people with 10-year fracture probabilities above or below these levels FOLLOW-UP: People with diagnosed cases of osteoporosis or at high risk for fracture should have regular bone mineral density tests. For patients eligible for Medicare, routine testing is allowed once every 2 years. The testing frequency can be increased to one year for patients who have rapidly progressing disease, those who are receiving or discontinuing medical therapy to restore bone mass, or have additional risk factors. I have reviewed this report, and agree with the above findings. [facility] Electronically Signed   By: Bary Richard M.D.   On: 06/28/2022 14:24       Assessment & Plan:  Hypercholesterolemia Assessment & Plan: The 10-year ASCVD risk score (Arnett DK, et al., 2019) is: 8.1%   Values used to calculate the score:     Age: 58 years     Sex: Female     Is Non-Hispanic African American: Yes     Diabetic: No     Tobacco smoker: No     Systolic Blood Pressure: 122 mmHg     Is BP treated: Yes     HDL Cholesterol: 72.5 mg/dL     Total Cholesterol: 211 mg/dL  Discussed calculated cholesterol risk.  Will notify me if desires to start cholesterol medication.    Hypertension, essential Assessment & Plan: Blood pressure as outlined.  On micardis 80mg . Follow pressures.  Follow metabolic panel.    Colon cancer screening  Decreased GFR Assessment & Plan: Stay hydrated.  Avoid antiinflammatory medication.  Continue  telmisartan.  Follow metabolic panel.   Orders: -     Basic metabolic panel; Future  Fatty liver Assessment & Plan: Previous ultrasound revealed fatty liver.  Follow liver function tests.     History of colonic polyps Assessment & Plan: Colonoscopy 06/2018.    Stress Assessment & Plan: Appears to be handling things relatively well.  Follow.     Other orders -     Metoprolol Succinate ER; Take 0.5 tablets (12.5 mg total) by mouth daily.  Dispense: 45 tablet; Refill: 3     Dale McCook, MD

## 2022-09-28 NOTE — Assessment & Plan Note (Signed)
The 10-year ASCVD risk score (Arnett DK, et al., 2019) is: 8.1%   Values used to calculate the score:     Age: 67 years     Sex: Female     Is Non-Hispanic African American: Yes     Diabetic: No     Tobacco smoker: No     Systolic Blood Pressure: 122 mmHg     Is BP treated: Yes     HDL Cholesterol: 72.5 mg/dL     Total Cholesterol: 211 mg/dL  Discussed calculated cholesterol risk.  Will notify me if desires to start cholesterol medication.

## 2022-09-28 NOTE — Assessment & Plan Note (Signed)
Blood pressure as outlined.  On micardis 80mg. Follow pressures.  Follow metabolic panel.  

## 2022-10-03 ENCOUNTER — Encounter: Payer: Self-pay | Admitting: Internal Medicine

## 2022-10-03 NOTE — Assessment & Plan Note (Signed)
Colonoscopy 06/2018.  

## 2022-10-03 NOTE — Assessment & Plan Note (Signed)
Stay hydrated.  Avoid antiinflammatory medication.  Continue telmisartan.  Follow metabolic panel.

## 2022-10-03 NOTE — Assessment & Plan Note (Signed)
Appears to be handling things relatively well.  Follow.  

## 2022-10-03 NOTE — Assessment & Plan Note (Signed)
Previous ultrasound revealed fatty liver.  Follow liver function tests.   

## 2022-10-26 ENCOUNTER — Other Ambulatory Visit: Payer: 59

## 2022-10-29 ENCOUNTER — Other Ambulatory Visit (INDEPENDENT_AMBULATORY_CARE_PROVIDER_SITE_OTHER): Payer: 59

## 2022-10-29 DIAGNOSIS — R944 Abnormal results of kidney function studies: Secondary | ICD-10-CM

## 2022-10-29 LAB — BASIC METABOLIC PANEL
BUN: 16 mg/dL (ref 6–23)
CO2: 27 mEq/L (ref 19–32)
Calcium: 9.8 mg/dL (ref 8.4–10.5)
Chloride: 106 mEq/L (ref 96–112)
Creatinine, Ser: 0.96 mg/dL (ref 0.40–1.20)
GFR: 62.03 mL/min (ref >60.00)
Glucose, Bld: 66 mg/dL — ABNORMAL LOW (ref 70–99)
Potassium: 4.9 mEq/L (ref 3.5–5.1)
Sodium: 142 mEq/L (ref 135–145)

## 2022-11-09 IMAGING — MG MM DIGITAL SCREENING BILAT W/ TOMO AND CAD
6 of 12 series · 6 of 36 positions shown · non-contrast
Comparison: Previous exam(s).

CLINICAL DATA: Screening.

EXAM:
DIGITAL SCREENING BILATERAL MAMMOGRAM WITH TOMOSYNTHESIS AND CAD
TECHNIQUE: Bilateral screening digital craniocaudal and mediolateral oblique
mammograms were obtained. Bilateral screening digital breast
tomosynthesis was performed. The images were evaluated with
computer-aided detection.

[R CC synth-2D (1 of 2)]
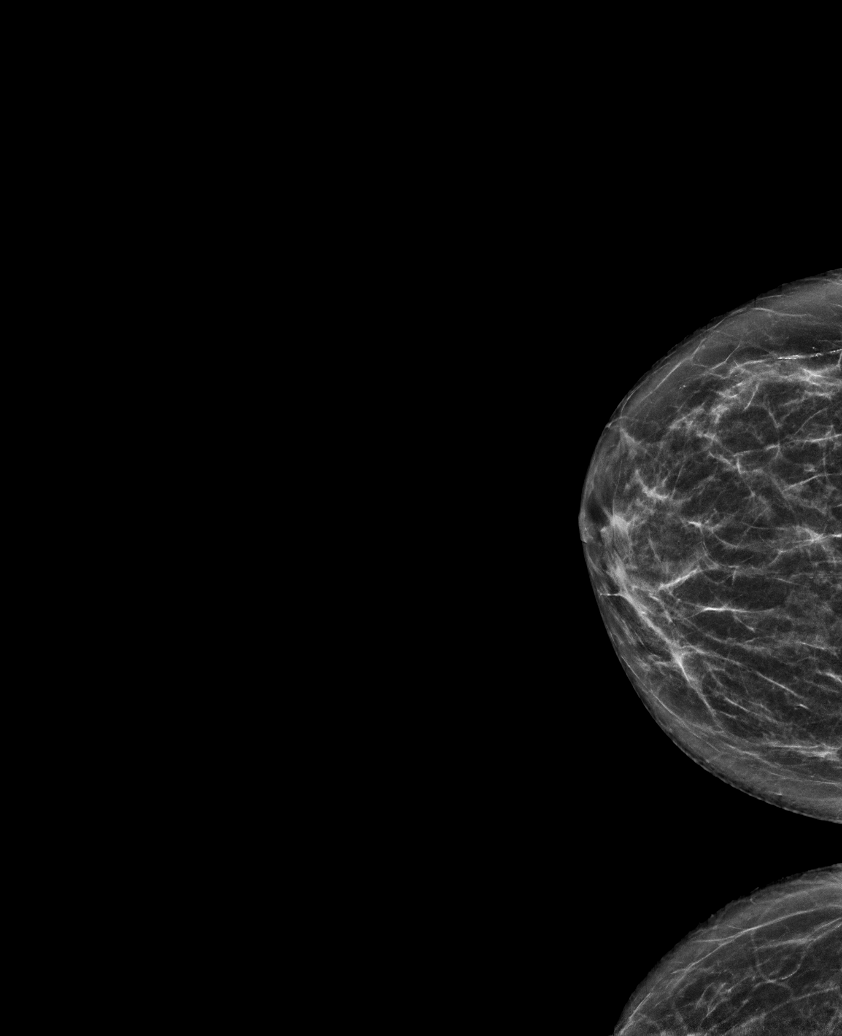

[R MLO synth-2D]
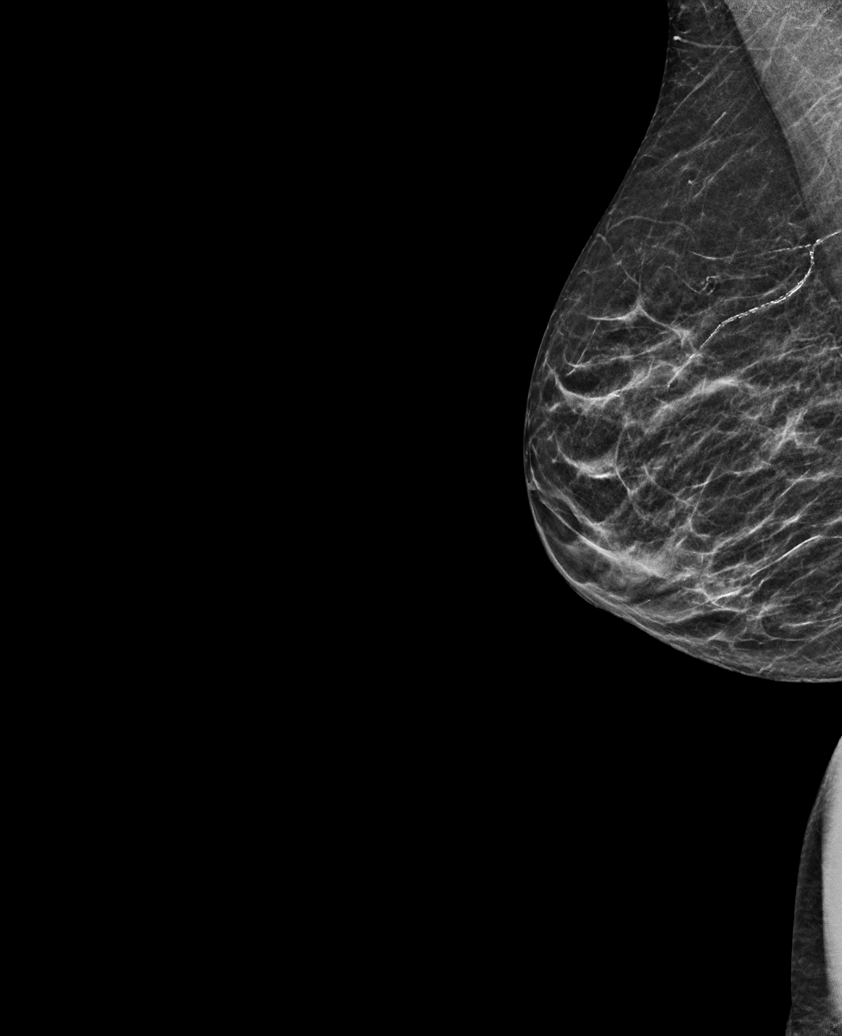

[L CC synth-2D (1 of 2)]
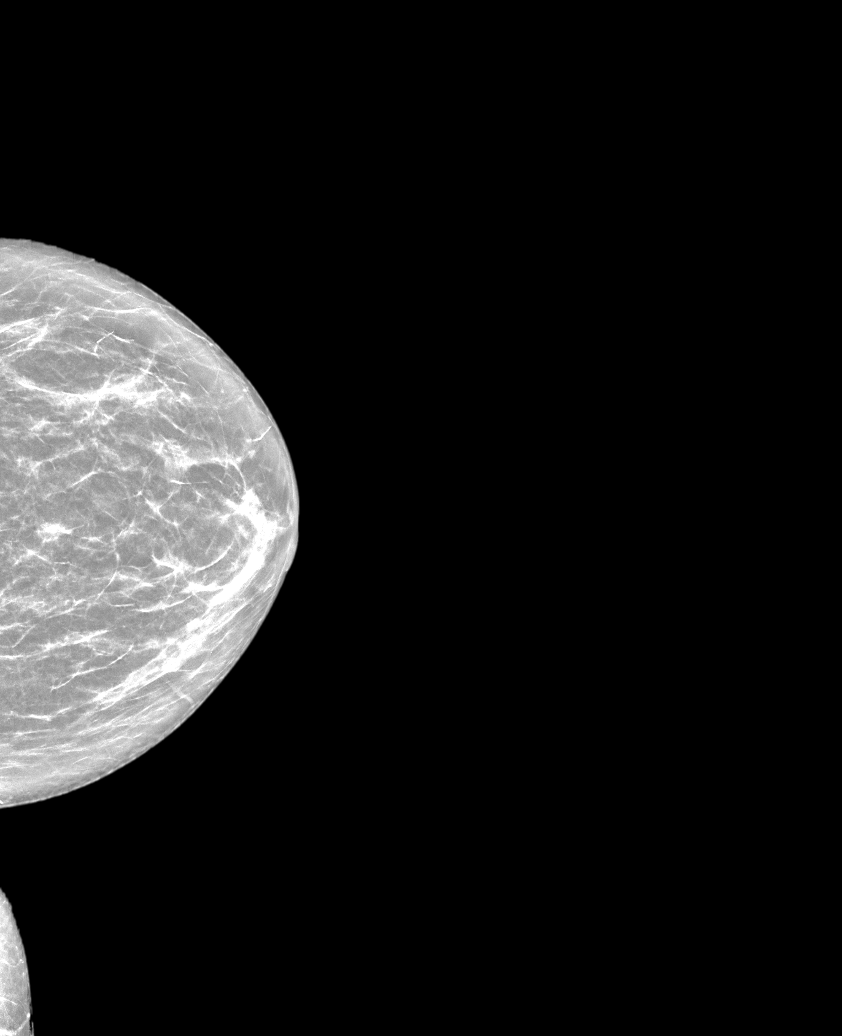

[R CC synth-2D (2 of 2)]
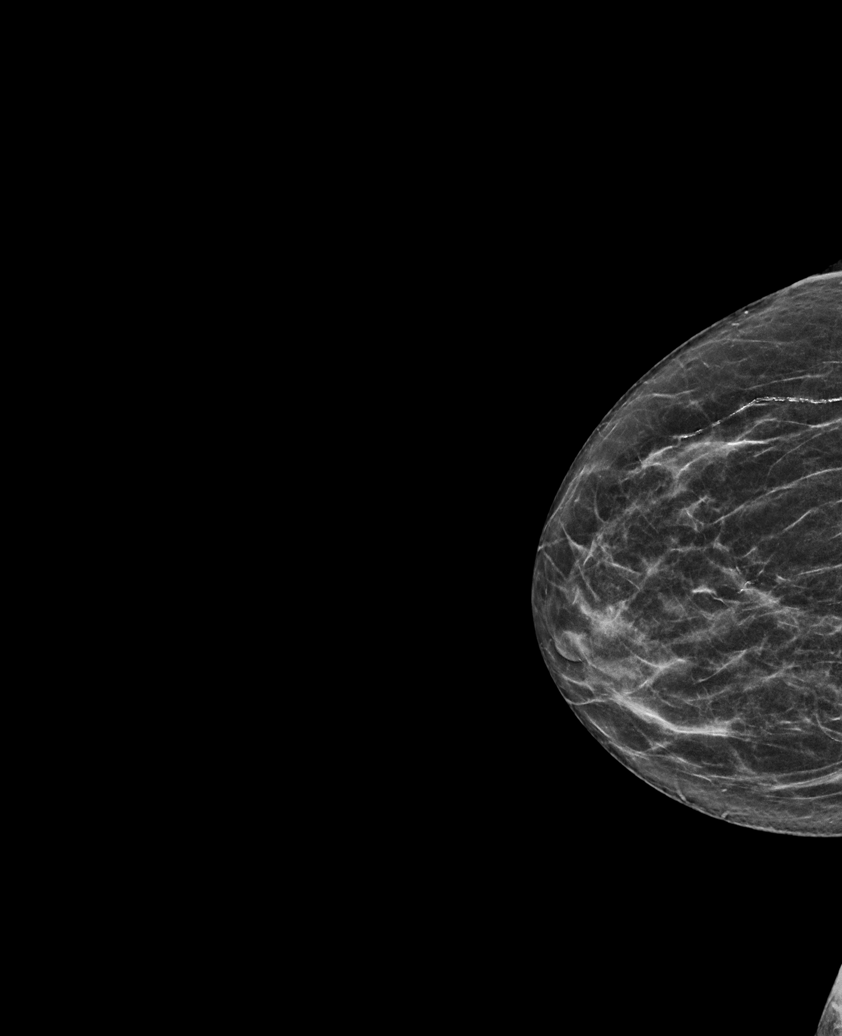

[L MLO synth-2D]
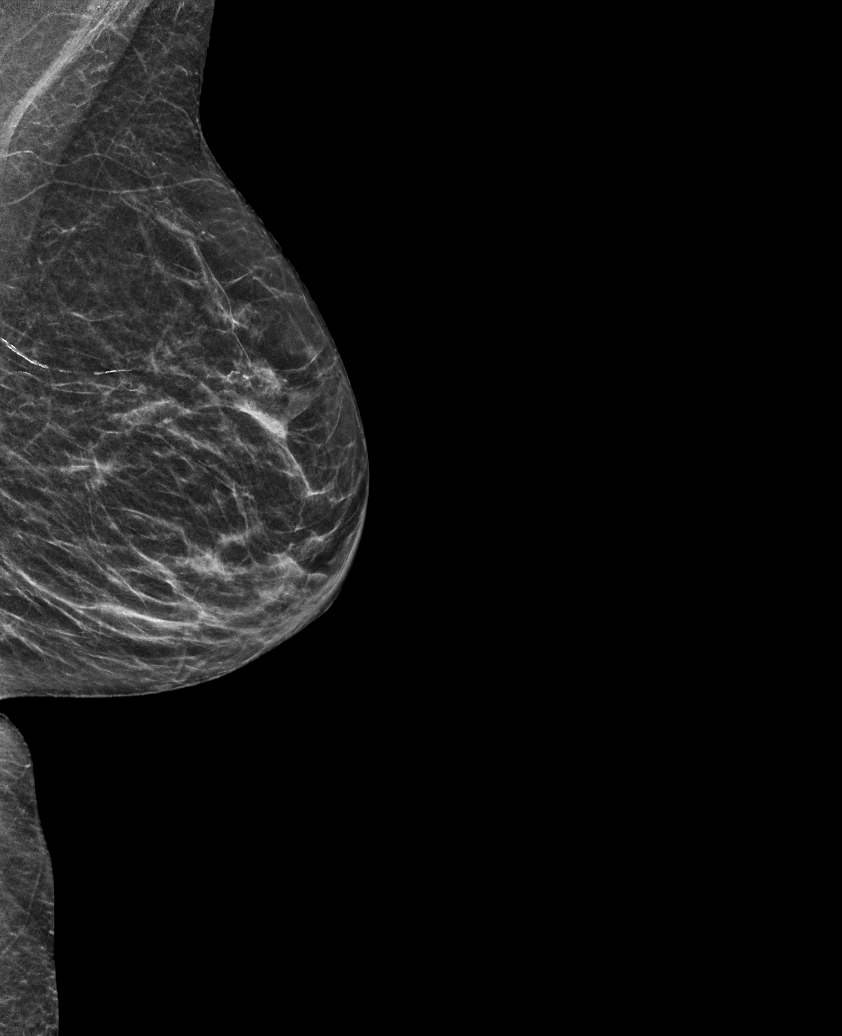

[L CC synth-2D (2 of 2)]
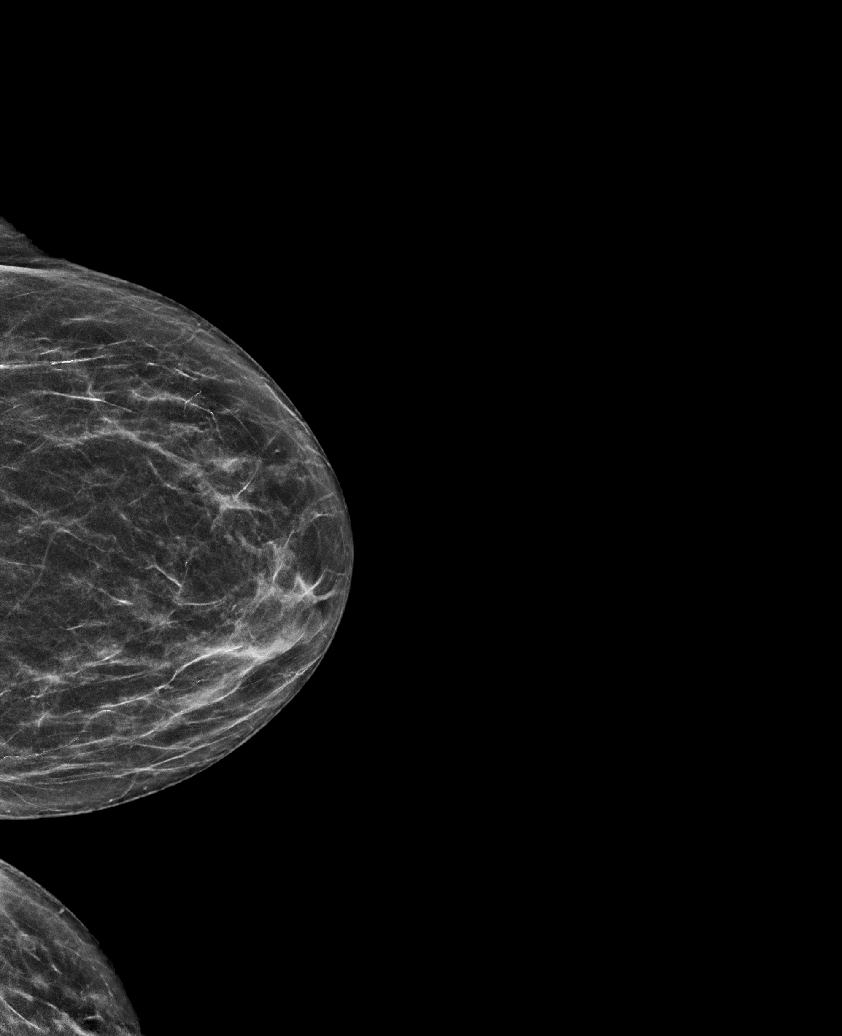

[6 of 36 positions shown; findings below may reference images not displayed]

ACR Breast Density Category c: The breast tissue is heterogeneously
dense, which may obscure small masses.
FINDINGS: There are no findings suspicious for malignancy. The images were
evaluated with computer-aided detection.
IMPRESSION: No mammographic evidence of malignancy. A result letter of this
screening mammogram will be mailed directly to the patient.

RECOMMENDATION:
Screening mammogram in one year. (Code:T4-5-GWO)

BI-RADS CATEGORY  1: Negative.

## 2023-01-28 ENCOUNTER — Ambulatory Visit: Payer: 59 | Admitting: Internal Medicine

## 2023-01-28 VITALS — BP 128/72 | HR 70 | Temp 98.0°F | Resp 16 | Ht 62.0 in | Wt 110.4 lb

## 2023-01-28 DIAGNOSIS — I10 Essential (primary) hypertension: Secondary | ICD-10-CM

## 2023-01-28 DIAGNOSIS — E7800 Pure hypercholesterolemia, unspecified: Secondary | ICD-10-CM | POA: Diagnosis not present

## 2023-01-28 DIAGNOSIS — K760 Fatty (change of) liver, not elsewhere classified: Secondary | ICD-10-CM | POA: Diagnosis not present

## 2023-01-28 DIAGNOSIS — M5030 Other cervical disc degeneration, unspecified cervical region: Secondary | ICD-10-CM | POA: Diagnosis not present

## 2023-01-28 DIAGNOSIS — Z860100 Personal history of colon polyps, unspecified: Secondary | ICD-10-CM

## 2023-01-28 DIAGNOSIS — F439 Reaction to severe stress, unspecified: Secondary | ICD-10-CM

## 2023-01-28 NOTE — Progress Notes (Unsigned)
24 hr tablet Take 0.5 tablets (12.5 mg total) by mouth daily.   multivitamin-iron-minerals-folic acid (CENTRUM) chewable tablet Chew 1 tablet by mouth daily.   telmisartan (MICARDIS) 80 MG tablet Take 1 tablet (80 mg total) by mouth daily.   timolol (TIMOPTIC) 0.25 % ophthalmic solution Place 1 drop into both eyes every morning.   No facility-administered encounter medications on file as of 01/28/2023.     Lab Results  Component Value Date   WBC 4.5 04/14/2022   HGB 12.4 04/14/2022   HCT 37.9 04/14/2022   PLT 264.0 04/14/2022   GLUCOSE 66 (L) 10/29/2022   CHOL 211 (H) 09/24/2022   TRIG 73.0 09/24/2022   HDL 72.50 09/24/2022   LDLDIRECT 119.7 05/30/2012   LDLCALC 124 (H) 09/24/2022   ALT 14 09/24/2022   AST 18 09/24/2022   NA 142 10/29/2022   K 4.9 10/29/2022   CL 106 10/29/2022   CREATININE 0.96 10/29/2022   BUN 16 10/29/2022   CO2 27 10/29/2022   TSH 1.77 04/14/2022    MM 3D SCREEN BREAST BILATERAL  Result Date: 06/29/2022 CLINICAL DATA:  Screening. EXAM: DIGITAL SCREENING BILATERAL MAMMOGRAM WITH TOMOSYNTHESIS AND CAD TECHNIQUE: Bilateral screening digital craniocaudal and mediolateral oblique mammograms were obtained. Bilateral screening digital breast tomosynthesis was performed. The images were evaluated with computer-aided detection. COMPARISON:  Previous exam(s). ACR Breast Density Category c: The breasts are heterogeneously dense, which may obscure small masses. FINDINGS: There are no findings suspicious for malignancy. IMPRESSION: No mammographic evidence of malignancy. A result letter of this screening mammogram will be mailed directly to the patient. RECOMMENDATION: Screening mammogram in  one year. (Code:SM-B-01Y) BI-RADS CATEGORY  1: Negative. Electronically Signed   By: Bary Richard M.D.   On: 06/29/2022 13:59   DG Bone Density  Result Date: 06/28/2022 EXAM: DUAL X-RAY ABSORPTIOMETRY (DXA) FOR BONE MINERAL DENSITY IMPRESSION: Your patient Clementene Montilla completed a BMD test on 06/28/2022 using the [employer] iDXA DXA System (software version: 14.10) manufactured by [employer]. The following summarizes the results of our evaluation. Technologist: [facility] PATIENT BIOGRAPHICAL: Name: Annahy, Throne Patient ID: [id number] Birth Date: 1955-11-14 Height: 62.0 in. Gender: Female Exam Date: 06/28/2022 Weight: 108.3 lbs. Indications: Hysterectomy, Postmenopausal Fractures: Treatments: Multi-Vitamin DENSITOMETRY RESULTS: Site      Region    Measured Date Measured Age WHO Classification Young Adult T-score BMD         %Change vs. Previous Significant Change (*) AP Spine L1-L4 06/28/2022 65.3 Osteoporosis -2.8 0.849 g/cm2 DualFemur Neck Left 06/28/2022 65.3 Osteoporosis -2.6 0.673 g/cm2 ASSESSMENT: The BMD measured at AP Spine L1-L4 is 0.849 g/cm2 with a T-score of -2.8. This patient is considered osteoporotic according to World Health Organization [facility]) criteria. The scan quality is good. World Science writer [facility]) criteria for post-menopausal, Caucasian Women: Normal:                   T-score at or above -1 SD Osteopenia/low bone mass: T-score between -1 and -2.5 SD Osteoporosis:             T-score at or below -2.5 SD RECOMMENDATIONS: 1. All patients should optimize calcium and vitamin D intake. 2. Consider FDA-approved medical therapies in postmenopausal women and men aged 20 years and older, based on the following: a. A hip or vertebral(clinical or morphometric) fracture b. T-score < -2.5 at the femoral neck or spine after appropriate evaluation to exclude secondary causes c. Low bone mass (T-score between -1.0 and -2.5 at the femoral neck  Subjective:    Patient ID: [name], female    DOB: [birth year], 67 y.o.   MRN: [id number]  Patient here for  Chief Complaint  Patient presents with   Medical Management of Chronic Issues    HPI Here to follow up regarding hypertension and hypercholesterolemia.  Has had issues with her neck for years.  Has noticed recently some increased neck issues and her hands feeling like they are going to sleep.  Increased discomfort with rotation of her head. Bilateral hand/fingers - numbness/tingling. Has been using heating pad.  Has tried multiple pillows and adjusted sleep positions. Previous c-spine xray - diffuse cervical spondylosis.     Past Medical History:  Diagnosis Date   Anemia    Chronic headaches    DDD (degenerative disc disease), cervical    Fatty liver    Frequent UTI    GERD (gastroesophageal reflux disease)    Glaucoma (increased eye pressure)    Hypercholesterolemia    Pre-eclampsia    Submandibular gland mass    right   Past Surgical History:  Procedure Laterality Date   ABDOMINAL HYSTERECTOMY     total   CESAREAN SECTION  04/05/1985   COLONOSCOPY     MASS EXCISION Right 02/17/2022   Procedure: EXCISION SUBMANDIBULAR MASS;  Surgeon: Geanie Logan, MD;  Location: [facility] ORS;  Service: ENT;  Laterality: Right;   TUBAL LIGATION  04/05/1985   Family History  Problem Relation Age of Onset   Diabetes Mother    Alzheimer's disease Mother    Seizures Mother    Heart disease Father    Breast cancer Neg Hx    Social History   Socioeconomic History   Marital status: Married    Spouse name: Not on file   Number of children: 2   Years of education: Not on file   Highest education level: Not on file  Occupational History   Not on file  Tobacco Use   Smoking status: Never   Smokeless tobacco: Never  Vaping Use   Vaping status: Never Used  Substance and Sexual Activity   Alcohol use: No    Alcohol/week: 0.0 standard drinks of alcohol   Drug use: No    Sexual activity: Not on file  Other Topics Concern   Not on file  Social History Narrative   Not on file   Social Determinants of Health   Financial Resource Strain: Low Risk  (06/23/2022)   Overall Financial Resource Strain (CARDIA)    Difficulty of Paying Living Expenses: Not very hard  Food Insecurity: No Food Insecurity (06/23/2022)   Hunger Vital Sign    Worried About Running Out of Food in the Last Year: Never true    Ran Out of Food in the Last Year: Never true  Transportation Needs: No Transportation Needs (06/23/2022)   PRAPARE - Administrator, Civil Service (Medical): No    Lack of Transportation (Non-Medical): No  Physical Activity: Insufficiently Active (06/23/2022)   Exercise Vital Sign    Days of Exercise per Week: 3 days    Minutes of Exercise per Session: 20 min  Stress: No Stress Concern Present (06/23/2022)   [employer] of Occupational Health - Occupational Stress Questionnaire    Feeling of Stress : Only a little  Social Connections: Socially Integrated (06/23/2022)   Social Connection and Isolation Panel [NHANES]    Frequency of Communication with Friends and Family: Three times a week    Frequency of Social Gatherings with  Subjective:    Patient ID: [name], female    DOB: [birth year], 67 y.o.   MRN: [id number]  Patient here for  Chief Complaint  Patient presents with   Medical Management of Chronic Issues    HPI Here to follow up regarding hypertension and hypercholesterolemia.  Has had issues with her neck for years.  Has noticed recently some increased neck issues and her hands feeling like they are going to sleep.  Increased discomfort with rotation of her head. Bilateral hand/fingers - numbness/tingling. Has been using heating pad.  Has tried multiple pillows and adjusted sleep positions. Previous c-spine xray - diffuse cervical spondylosis.     Past Medical History:  Diagnosis Date   Anemia    Chronic headaches    DDD (degenerative disc disease), cervical    Fatty liver    Frequent UTI    GERD (gastroesophageal reflux disease)    Glaucoma (increased eye pressure)    Hypercholesterolemia    Pre-eclampsia    Submandibular gland mass    right   Past Surgical History:  Procedure Laterality Date   ABDOMINAL HYSTERECTOMY     total   CESAREAN SECTION  04/05/1985   COLONOSCOPY     MASS EXCISION Right 02/17/2022   Procedure: EXCISION SUBMANDIBULAR MASS;  Surgeon: Geanie Logan, MD;  Location: [facility] ORS;  Service: ENT;  Laterality: Right;   TUBAL LIGATION  04/05/1985   Family History  Problem Relation Age of Onset   Diabetes Mother    Alzheimer's disease Mother    Seizures Mother    Heart disease Father    Breast cancer Neg Hx    Social History   Socioeconomic History   Marital status: Married    Spouse name: Not on file   Number of children: 2   Years of education: Not on file   Highest education level: Not on file  Occupational History   Not on file  Tobacco Use   Smoking status: Never   Smokeless tobacco: Never  Vaping Use   Vaping status: Never Used  Substance and Sexual Activity   Alcohol use: No    Alcohol/week: 0.0 standard drinks of alcohol   Drug use: No    Sexual activity: Not on file  Other Topics Concern   Not on file  Social History Narrative   Not on file   Social Determinants of Health   Financial Resource Strain: Low Risk  (06/23/2022)   Overall Financial Resource Strain (CARDIA)    Difficulty of Paying Living Expenses: Not very hard  Food Insecurity: No Food Insecurity (06/23/2022)   Hunger Vital Sign    Worried About Running Out of Food in the Last Year: Never true    Ran Out of Food in the Last Year: Never true  Transportation Needs: No Transportation Needs (06/23/2022)   PRAPARE - Administrator, Civil Service (Medical): No    Lack of Transportation (Non-Medical): No  Physical Activity: Insufficiently Active (06/23/2022)   Exercise Vital Sign    Days of Exercise per Week: 3 days    Minutes of Exercise per Session: 20 min  Stress: No Stress Concern Present (06/23/2022)   [employer] of Occupational Health - Occupational Stress Questionnaire    Feeling of Stress : Only a little  Social Connections: Socially Integrated (06/23/2022)   Social Connection and Isolation Panel [NHANES]    Frequency of Communication with Friends and Family: Three times a week    Frequency of Social Gatherings with  Subjective:    Patient ID: [name], female    DOB: [birth year], 67 y.o.   MRN: [id number]  Patient here for  Chief Complaint  Patient presents with   Medical Management of Chronic Issues    HPI Here to follow up regarding hypertension and hypercholesterolemia.  Has had issues with her neck for years.  Has noticed recently some increased neck issues and her hands feeling like they are going to sleep.  Increased discomfort with rotation of her head. Bilateral hand/fingers - numbness/tingling. Has been using heating pad.  Has tried multiple pillows and adjusted sleep positions. Previous c-spine xray - diffuse cervical spondylosis.     Past Medical History:  Diagnosis Date   Anemia    Chronic headaches    DDD (degenerative disc disease), cervical    Fatty liver    Frequent UTI    GERD (gastroesophageal reflux disease)    Glaucoma (increased eye pressure)    Hypercholesterolemia    Pre-eclampsia    Submandibular gland mass    right   Past Surgical History:  Procedure Laterality Date   ABDOMINAL HYSTERECTOMY     total   CESAREAN SECTION  04/05/1985   COLONOSCOPY     MASS EXCISION Right 02/17/2022   Procedure: EXCISION SUBMANDIBULAR MASS;  Surgeon: Geanie Logan, MD;  Location: [facility] OR[facility];  Service: ENT;  Laterality: Right;   TUBAL LIGATION  04/05/1985   Family History  Problem Relation Age of Onset   Diabetes Mother    Alzheimer's disease Mother    Seizures Mother    Heart disease Father    Breast cancer Neg Hx    Social History   Socioeconomic History   Marital status: Married    Spouse name: Not on file   Number of children: 2   Years of education: Not on file   Highest education level: Not on file  Occupational History   Not on file  Tobacco Use   Smoking status: Never   Smokeless tobacco: Never  Vaping Use   Vaping status: Never Used  Substance and Sexual Activity   Alcohol use: No    Alcohol/week: 0.0 standard drinks of alcohol   Drug use: No    Sexual activity: Not on file  Other Topics Concern   Not on file  Social History Narrative   Not on file   Social Determinants of Health   Financial Resource Strain: Low Risk  (06/23/2022)   Overall Financial Resource Strain (CARDIA)    Difficulty of Paying Living Expenses: Not very hard  Food Insecurity: No Food Insecurity (06/23/2022)   Hunger Vital Sign    Worried About Running Out of Food in the Last Year: Never true    Ran Out of Food in the Last Year: Never true  Transportation Needs: No Transportation Needs (06/23/2022)   PRAPARE - Administrator, Civil Service (Medical): No    Lack of Transportation (Non-Medical): No  Physical Activity: Insufficiently Active (06/23/2022)   Exercise Vital Sign    Days of Exercise per Week: 3 days    Minutes of Exercise per Session: 20 min  Stress: No Stress Concern Present (06/23/2022)   [employer] of Occupational Health - Occupational Stress Questionnaire    Feeling of Stress : Only a little  Social Connections: Socially Integrated (06/23/2022)   Social Connection and Isolation Panel [NHANES]    Frequency of Communication with Friends and Family: Three times a week    Frequency of Social Gatherings with

## 2023-01-30 ENCOUNTER — Encounter: Payer: Self-pay | Admitting: Internal Medicine

## 2023-01-30 NOTE — Assessment & Plan Note (Signed)
Blood pressure as outlined.  On micardis '80mg'$ . Follow pressures.  Follow metabolic panel.

## 2023-01-30 NOTE — Assessment & Plan Note (Signed)
Has been a persistent intermittent issue.  Worsening symptoms recently.  Now with bilateral hand/finger numbness/tingling.  Previous c-spine xray - diffuse cervical spondylosis.  Discussed further w/up and evaluation.  Hold on repeat xray.  Given persistence and now with numbness/tingling - will have NSU evaluate.

## 2023-01-30 NOTE — Assessment & Plan Note (Signed)
Appears to be handling things relatively well.  Follow.  

## 2023-01-30 NOTE — Assessment & Plan Note (Signed)
The 10-year ASCVD risk score (Arnett DK, et al., 2019) is: 9%   Values used to calculate the score:     Age: 67 years     Sex: Female     Is Non-Hispanic African American: Yes     Diabetic: No     Tobacco smoker: No     Systolic Blood Pressure: 128 mmHg     Is BP treated: Yes     HDL Cholesterol: 72.5 mg/dL     Total Cholesterol: 211 mg/dL  Discussed calculated cholesterol risk.  Will notify me if desires to start cholesterol medication.

## 2023-01-30 NOTE — Assessment & Plan Note (Signed)
Previous ultrasound revealed fatty liver.  Follow liver function tests.   

## 2023-01-30 NOTE — Assessment & Plan Note (Signed)
Colonoscopy 06/2018.

## 2023-02-11 NOTE — Progress Notes (Unsigned)
Referring Physician:  Dale Bagnell, MD [address] [place],  [place] [address]  Primary Physician:  Dale Dubberly, MD  History of Present Illness: 02/11/2023*** [name] has a history of HTN, hypercholesterolemia, fatty liver, GERD, osteoporosis, and glaucoma.    She complains of *** neck pain with numbness and tingling in both hands.   Duration: *** Location: *** Quality: *** Severity: ***  Precipitating: aggravated by *** Modifying factors: made better by *** Weakness: none Timing: *** Bowel/Bladder Dysfunction: none  Conservative measures:  Physical therapy: ***  Multimodal medical therapy including regular antiinflammatories: ***  Injections: *** epidural steroid injections  Past Surgery: ***  Angel Taylor has ***no symptoms of cervical myelopathy.  The symptoms are causing a significant impact on the patient's life.   Review of Systems:  A 10 point review of systems is negative, except for the pertinent positives and negatives detailed in the HPI.  Past Medical History: Past Medical History:  Diagnosis Date   Anemia    Chronic headaches    DDD (degenerative disc disease), cervical    Fatty liver    Frequent UTI    GERD (gastroesophageal reflux disease)    Glaucoma (increased eye pressure)    Hypercholesterolemia    Pre-eclampsia    Submandibular gland mass    right    Past Surgical History: Past Surgical History:  Procedure Laterality Date   ABDOMINAL HYSTERECTOMY     total   CESAREAN SECTION  04/05/1985   COLONOSCOPY     MASS EXCISION Right 02/17/2022   Procedure: EXCISION SUBMANDIBULAR MASS;  Surgeon: Geanie Logan, MD;  Location: [facility] OR[facility];  Service: ENT;  Laterality: Right;   TUBAL LIGATION  04/05/1985    Allergies: Allergies as of 02/17/2023 - Review Complete 01/30/2023  Allergen Reaction Noted   Bactrim [sulfamethoxazole-trimethoprim]  05/29/2012   Macrobid [nitrofurantoin macrocrystal]  05/29/2012    Other Hives 02/15/2022   Sulfa antibiotics  05/29/2012    Medications: Outpatient Encounter Medications as of 02/17/2023  Medication Sig   Calcium-Vitamin D-Vitamin K (VIACTIV CALCIUM PLUS D) 650-12.5-40 MG-MCG-MCG CHEW Chew 2 capsules by mouth daily.   diphenhydrAMINE (BENADRYL) 25 MG tablet Take 25 mg by mouth every 6 (six) hours as needed for allergies.   famotidine (PEPCID) 20 MG tablet Take 1 tablet (20 mg total) by mouth daily.   latanoprost (XALATAN) 0.005 % ophthalmic solution Place 1 drop into both eyes at bedtime.   metoprolol succinate (TOPROL-XL) 25 MG 24 hr tablet Take 0.5 tablets (12.5 mg total) by mouth daily.   multivitamin-iron-minerals-folic acid (CENTRUM) chewable tablet Chew 1 tablet by mouth daily.   telmisartan (MICARDIS) 80 MG tablet Take 1 tablet (80 mg total) by mouth daily.   timolol (TIMOPTIC) 0.25 % ophthalmic solution Place 1 drop into both eyes every morning.   No facility-administered encounter medications on file as of 02/17/2023.    Social History: Social History   Tobacco Use   Smoking status: Never   Smokeless tobacco: Never  Vaping Use   Vaping status: Never Used  Substance Use Topics   Alcohol use: No    Alcohol/week: 0.0 standard drinks of alcohol   Drug use: No    Family Medical History: Family History  Problem Relation Age of Onset   Diabetes Mother    Alzheimer's disease Mother    Seizures Mother    Heart disease Father    Breast cancer Neg Hx     Physical Examination: There were no vitals filed for  this visit.  General: Patient is well developed, well nourished, calm, collected, and in no apparent distress. Attention to examination is appropriate.  Respiratory: Patient is breathing without any difficulty.   NEUROLOGICAL:     Awake, alert, oriented to person, place, and time.  Speech is clear and fluent. Fund of knowledge is appropriate.   Cranial Nerves: Pupils equal round and reactive to light.  Facial tone is  symmetric.    *** ROM of cervical spine *** pain *** posterior cervical tenderness. *** tenderness in bilateral trapezial region.   *** ROM of lumbar spine *** pain *** posterior lumbar tenderness.   No abnormal lesions on exposed skin.   Strength: Side Biceps Triceps Deltoid Interossei Grip Wrist Ext. Wrist Flex.  R 5 5 5 5 5 5 5   L 5 5 5 5 5 5 5    Side Iliopsoas Quads Hamstring PF DF EHL  R 5 5 5 5 5 5   L 5 5 5 5 5 5    Reflexes are ***2+ and symmetric at the biceps, brachioradialis, patella and achilles.   Hoffman's is absent.  Clonus is not present.   Bilateral upper and lower extremity sensation is intact to light touch.     Gait is normal.   ***No difficulty with tandem gait.    Medical Decision Making  Imaging: Cervical xrays dated 02/02/19:  FINDINGS: Diffuse degenerative disc disease and facet disease. Normal alignment. Prevertebral soft tissues are normal. No fracture.   IMPRESSION: Diffuse cervical spondylosis.  No acute bony abnormality.     Electronically Signed   By: Charlett Nose M.D.   On: 02/02/2019 11:44   I have personally reviewed the images and agree with the above interpretation.  Assessment and Plan: Angel Taylor is a pleasant 67 y.o. female has ***  Treatment options discussed with patient and following plan made:   - Order for physical therapy for *** spine ***. Patient to call to schedule appointment. *** - Continue current medications including ***. Reviewed dosing and side effects.  - Prescription for ***. Reviewed dosing and side effects. Take with food.  - Prescription for *** to take prn muscle spasms. Reviewed dosing and side effects. Discussed this can cause drowsiness.  - MRI of *** to further evaluate *** radiculopathy. No improvement time or medications (***).  - Referral to PMR at [facility] to discuss possible *** injections.  - Will schedule phone visit to review MRI results once I get them back.   I spent a total of *** minutes in  face-to-face and non-face-to-face activities related to this patient's care today including review of outside records, review of imaging, review of symptoms, physical exam, discussion of differential diagnosis, discussion of treatment options, and documentation.   Thank you for involving me in the care of this patient.   Drake Leach PA-C Dept. of Neurosurgery

## 2023-02-17 ENCOUNTER — Encounter: Payer: Self-pay | Admitting: Orthopedic Surgery

## 2023-02-17 ENCOUNTER — Ambulatory Visit: Payer: 59 | Admitting: Orthopedic Surgery

## 2023-02-17 VITALS — BP 128/88 | Ht 62.0 in | Wt 111.0 lb

## 2023-02-17 DIAGNOSIS — R202 Paresthesia of skin: Secondary | ICD-10-CM | POA: Diagnosis not present

## 2023-02-17 DIAGNOSIS — M47812 Spondylosis without myelopathy or radiculopathy, cervical region: Secondary | ICD-10-CM | POA: Diagnosis not present

## 2023-02-17 DIAGNOSIS — R200 Anesthesia of skin: Secondary | ICD-10-CM

## 2023-02-17 DIAGNOSIS — M5030 Other cervical disc degeneration, unspecified cervical region: Secondary | ICD-10-CM | POA: Diagnosis not present

## 2023-02-17 NOTE — Patient Instructions (Signed)
It was so nice to see you today. Thank you so much for coming in.    Your neck xrays from 2020 showed wear and tear (arthritis) and I think this may be causing your pain.  The numbness and tingling in her hands may be from carpal tunnel syndrome.  I ordered xrays of your neck. You can get these at [facility] (building with the white pillars) off of [address]. The address is [address] [place], [place] [address]. You do not need any appointment.   After you have the xrays, it takes 10-14 days for me to get the results back. Once I have them, I will message you with the results.  You can get an over the counter carpal tunnel brace for each hand.  Wear these at night to help with numbness and tingling.  I want to get an EMG (nerve conduction test) to look into things further. I have ordered this and [facility] will call you to schedule. You can also call them at [phone].   We will likely set up a phone visit to review the nerve conduction test results.  Please do not hesitate to call if you have any questions or concerns. You can also message me in MyChart.   Drake Leach PA-C [phone]     The physicians and staff at [facility] at [facility] are committed to providing excellent care. You may receive a survey asking for feedback about your experience at our office. We value you your feedback and appreciate you taking the time to to fill it out. The [facility] leadership team is also available to discuss your experience in person, feel free to contact us [phone].

## 2023-02-18 ENCOUNTER — Telehealth: Payer: Self-pay

## 2023-02-18 NOTE — Telephone Encounter (Signed)
Tried to reach patient to let her know price is the same at hospital as here.

## 2023-02-18 NOTE — Telephone Encounter (Signed)
Patient states she just saw Drake Leach, PA-C yesterday.    Patient states Drake Leach, PA-C put on orders for patient to have an x-ray at the hospital.  Patient states she would like to know if it will be less expensive for her to have this x-ray at our office.  Patient states, if so, she would like to have it done at [facility].   Patient states we can send her a message via MyChart or call her.  Patient states she needs to have this done as soon as possible.

## 2023-02-21 ENCOUNTER — Ambulatory Visit
Admission: RE | Admit: 2023-02-21 | Discharge: 2023-02-21 | Disposition: A | Discharge status: Home / Self Care | Payer: 59 | Source: Ambulatory Visit | Attending: Orthopedic Surgery | Admitting: Orthopedic Surgery

## 2023-02-21 ENCOUNTER — Ambulatory Visit
Admission: RE | Admit: 2023-02-21 | Discharge: 2023-02-21 | Disposition: A | Discharge status: Home / Self Care | Payer: 59 | Attending: Orthopedic Surgery | Admitting: Orthopedic Surgery

## 2023-02-21 DIAGNOSIS — M47812 Spondylosis without myelopathy or radiculopathy, cervical region: Secondary | ICD-10-CM | POA: Insufficient documentation

## 2023-02-21 DIAGNOSIS — R202 Paresthesia of skin: Secondary | ICD-10-CM | POA: Insufficient documentation

## 2023-02-21 DIAGNOSIS — R200 Anesthesia of skin: Secondary | ICD-10-CM | POA: Insufficient documentation

## 2023-02-21 DIAGNOSIS — M5030 Other cervical disc degeneration, unspecified cervical region: Secondary | ICD-10-CM | POA: Diagnosis present

## 2023-02-22 ENCOUNTER — Encounter: Payer: Self-pay | Admitting: Neurology

## 2023-02-22 ENCOUNTER — Other Ambulatory Visit: Payer: Self-pay

## 2023-02-22 DIAGNOSIS — R202 Paresthesia of skin: Secondary | ICD-10-CM

## 2023-02-23 NOTE — Telephone Encounter (Signed)
X-ray has been done.

## 2023-03-10 ENCOUNTER — Encounter: Payer: Self-pay | Admitting: Orthopedic Surgery

## 2023-03-10 NOTE — Telephone Encounter (Signed)
Cervical xrays dated 02/21/23:  FINDINGS: The cervical spine is visualized from C1-C7. Straightening of the cervical lordosis with trace retrolisthesis at C4-5 on neutral imaging which is similar on flexion and extension views. There is approximately 2 mm of retrolisthesis on extension at C5-6. This normalizes with flexion or neutral positioning. Vertebral body heights are maintained: no evidence of acute fracture. Moderate intervertebral disc space height loss at C3-4, C4-5 and C5-6. Mild to moderate intervertebral disc space height loss at C6-7. Multilevel uncovertebral hypertrophy and facet arthropathy. No prevertebral soft tissue swelling. Visualized thorax is unremarkable.   IMPRESSION: 1. Multilevel degenerative changes of the cervical spine most pronounced at C3-4, C4-5 and C5-6. 2. Retrolisthesis of C4-5 and C5-6 described above.     Electronically Signed   By: Meda Klinefelter M.D.   On: 03/09/2023 15:36   I have personally reviewed the images and agree with the above interpretation.  She has diffuse cervical spondylosis and DDD with slight retrolisthesis C4-C5 and C5-C6.   Message sent to patient regarding results.

## 2023-03-28 ENCOUNTER — Ambulatory Visit: Payer: 59 | Admitting: Neurology

## 2023-03-28 DIAGNOSIS — R202 Paresthesia of skin: Secondary | ICD-10-CM | POA: Diagnosis not present

## 2023-03-28 DIAGNOSIS — G5601 Carpal tunnel syndrome, right upper limb: Secondary | ICD-10-CM

## 2023-03-28 NOTE — Procedures (Signed)
[facility]  [address], [address]  [place], [place] [address] Tel: [phone] Fax: [phone] Test Date:  03/28/2023  Patient: Angel Taylor DOB: July 30, 1955 Physician: Jacquelyne Balint, MD  Sex: Female Height: 5\' 2"  Ref Phys: Drake Leach, PA-C  ID#: [id number]   Technician:    History: This is a 67 year old female with neck pain and numbness and tingling in her hands.  NCV & EMG Findings: Extensive electrodiagnostic evaluation of bilateral upper limbs shows: Right median-ulnar palmar sensory response shows prolonged distal peak latency (Median Palm-Wrist, 2.4 ms) and abnormal peak latency difference ((Median Palm-Wrist)-(Ulnar Palm-Wrist), 0.60 ms). Bilateral median, ulnar, radial, and left median-ulnar palmar sensory responses are within normal limits. Bilateral median (APB) and ulnar (ADM) motor responses are within normal limits. There is no evidence of active or chronic motor axon loss changes affecting any of the tested muscles on needle examination. Motor unit configuration and recruitment pattern is within normal limits.  Impression: This is an abnormal study. The findings are most consistent with the following: Evidence of a right median mononeuropathy at or distal to the wrist, consistent with carpal tunnel syndrome, very mild in degree electrically. No electrodiagnostic evidence of a left median mononeuropathy at or distal to the wrist, consistent with carpal tunnel syndrome. No electrodiagnostic evidence of a right or left cervical (C5-C8) motor radiculopathy. Screening studies for right or left ulnar or radial mononeuropathies are normal.    ___________________________ Jacquelyne Balint, MD    Nerve Conduction Studies Motor Nerve Results    Latency Amplitude F-Lat Segment Distance CV Comment  Site (ms) Norm (mV) Norm (ms)  (cm) (m/s) Norm   Left Median (APB) Motor  Wrist 2.7  < 4.0 11.0  > 5.0        Elbow 7.0 - 10.7 -  Elbow-Wrist 26 60  > 50   Right  Median (APB) Motor  Wrist 3.1  < 4.0 7.4  > 5.0        Elbow 7.2 - 6.8 -  Elbow-Wrist 24 59  > 50   Left Ulnar (ADM) Motor  Wrist 2.3  < 3.1 12.5  > 7.0        Bel elbow 6.0 - 12.3 -  Bel elbow-Wrist 19 51  > 50   Ab elbow 8.0 - 11.2 -  Ab elbow-Bel elbow 10 50 -   Right Ulnar (ADM) Motor  Wrist 1.95  < 3.1 14.7  > 7.0        Bel elbow 5.5 - 12.8 -  Bel elbow-Wrist 19.5 54  > 50   Ab elbow 7.5 - 11.7 -  Ab elbow-Bel elbow 10 50 -    Sensory Sites    Neg Peak Lat Amplitude (O-P) Segment Distance Velocity Comment  Site (ms) Norm (V) Norm  (cm) (ms)   Left Median Sensory  Wrist-Dig II 3.3  < 3.8 34  > 10 Wrist-Dig II 13    Right Median Sensory  Wrist-Dig II 3.5  < 3.8 28  > 10 Wrist-Dig II 13    Left Median-Ulnar Palmar Sensory       Median  Palm-Wrist 2.1  < 2.2 64  > 10 Palm-Wrist 8         Ulnar  Palm-Wrist 2.2  < 2.2 30  > 5 Palm-Wrist 8    Right Median-Ulnar Palmar Sensory       Median  Palm-Wrist *2.4  < 2.2 76  > 10 Palm-Wrist 8  Ulnar  Palm-Wrist 1.80  < 2.2 32  > 5 Palm-Wrist 8    Left Radial Sensory  Forearm-Wrist 2.2  < 2.8 45  > 10 Forearm-Wrist 10    Right Radial Sensory  Forearm-Wrist 2.4  < 2.8 25  > 10 Forearm-Wrist 10    Left Ulnar Sensory  Wrist-Dig V 3.2  < 3.2 20  > 5 Wrist-Dig V 11    Right Ulnar Sensory  Wrist-Dig V 3.2  < 3.2 22  > 5 Wrist-Dig V 11     Inter-Nerve Comparisons   Nerve 1 Value 1 Nerve 2 Value 2 Parameter Result Normal  Sensory Sites  R Median Palm-Wrist 2.4 ms R Ulnar Palm-Wrist 1.80 ms Peak Lat Diff *0.60 ms <0.40  L Median Palm-Wrist 2.1 ms L Ulnar Palm-Wrist 2.2 ms Peak Lat Diff 0.10 ms <0.40   Electromyography   Side Muscle Ins.Act Fibs Fasc Recrt Amp Dur Poly Activation Comment  Right FDI Nml Nml Nml Nml Nml Nml Nml Nml N/A  Right EIP Nml Nml Nml Nml Nml Nml Nml Nml N/A  Right Pronator teres Nml Nml Nml Nml Nml Nml Nml Nml N/A  Right Biceps Nml Nml Nml Nml Nml Nml Nml Nml N/A  Right Triceps Nml Nml Nml Nml Nml Nml  Nml Nml N/A  Right Deltoid Nml Nml Nml Nml Nml Nml Nml Nml N/A  Left FDI Nml Nml Nml Nml Nml Nml Nml Nml N/A  Left EIP Nml Nml Nml Nml Nml Nml Nml Nml N/A  Left Pronator teres Nml Nml Nml Nml Nml Nml Nml Nml N/A  Left Biceps Nml Nml Nml Nml Nml Nml Nml Nml N/A  Left Triceps Nml Nml Nml Nml Nml Nml Nml Nml N/A  Left Deltoid Nml Nml Nml Nml Nml Nml Nml Nml N/A      Waveforms:  Motor           Sensory

## 2023-04-11 NOTE — Progress Notes (Addendum)
 Telephone Visit- Progress Note: Referring Physician:  No referring provider defined for this encounter.  Primary Physician:  Glendia Shad, MD  This visit was performed via telephone.  Patient location: home Provider location: office  I spent a total of 10 minutes non-face-to-face activities for this visit on the date of this encounter including review of current clinical condition and response to treatment.    Patient has given verbal consent to this telephone visits and we reviewed the limitations of a telephone visit. Patient wishes to proceed.    Chief Complaint:  follow up EMG  History of Present Illness: Angel Taylor is a 68 y.o. female has a history of HTN, hypercholesterolemia, fatty liver, GERD, osteoporosis, and glaucoma.  Last seen by me on 02/17/23 for chronic constant neck pain  x years with 1 month history of intermittent numbness and tingling in both hands. No arm pain.   Cervical xrays showed diffuse cervical spondylosis and DDD with retrolisthesis C4-C4 and C5-C6. Some movement at C5-C6 with flex/ext (approximately 2mm).    She was advised to get OTC wrist splints to wear at night and EMG ordered.   Phone visit scheduled to review EMG results.   She continues with constant neck pain with no arm pain. She has improvement with massage. She also continues with intermittent numbness and tingling in both hands that is worse in the morning and at night, but she also gets it during the day. No arm pain.    She only tolerates tylenol , other NSAIDS cause GI issues.    Conservative measures:  Physical therapy: has not participated in PT in a couple years - last did 2+ years ago and does some of her HEP Multimodal medical therapy including regular antiinflammatories: tylenol   Injections: no epidural steroid injections   Past Surgery: no   [name] has no symptoms of cervical myelopathy.   The symptoms are causing a significant impact on the patient's  life.   Exam: No exam done as this was a telephone encounter.     Imaging: EMG of bilateral upper extremities dated 03/28/23:  Impression: This is an abnormal study. The findings are most consistent with the following: Evidence of a right median mononeuropathy at or distal to the wrist, consistent with carpal tunnel syndrome, very mild in degree electrically. No electrodiagnostic evidence of a left median mononeuropathy at or distal to the wrist, consistent with carpal tunnel syndrome. No electrodiagnostic evidence of a right or left cervical (C5-C8) motor radiculopathy. Screening studies for right or left ulnar or radial mononeuropathies are normal.       ___________________________ Venetia Potters, MD   Assessment and Plan: [name] continues with constant neck pain with no arm pain.   She has known diffuse cervical spondylosis and DDD with retrolisthesis C4-C4 and C5-C6. Some movement at C5-C6 with flex/ext (approximately 2mm).   She also continues with intermittent numbness and tingling in both hands that is worse in the morning and at night, but she also gets it during the day. No arm pain.   EMG showed right carpal tunnel syndrome.    Treatment options discussed with patient and following plan made:    - PT for cervical spine. Orders to [facility] PT.  - She will get new carpal tunnel braces for both hands and wear at night.  - Discussed possible carpal tunnel injection on right and surgery. She wants to try bracing first.  - Follow up with me in 8-12 weeks for recheck.  Glade Boys PA-C Neurosurgery

## 2023-04-15 ENCOUNTER — Encounter: Payer: Self-pay | Admitting: Orthopedic Surgery

## 2023-04-15 ENCOUNTER — Ambulatory Visit (INDEPENDENT_AMBULATORY_CARE_PROVIDER_SITE_OTHER): Payer: 59 | Admitting: Orthopedic Surgery

## 2023-04-15 DIAGNOSIS — G5601 Carpal tunnel syndrome, right upper limb: Secondary | ICD-10-CM

## 2023-04-15 DIAGNOSIS — M47812 Spondylosis without myelopathy or radiculopathy, cervical region: Secondary | ICD-10-CM

## 2023-04-15 DIAGNOSIS — M5030 Other cervical disc degeneration, unspecified cervical region: Secondary | ICD-10-CM

## 2023-04-15 NOTE — Telephone Encounter (Signed)
 Labs from 10/29/22 show normal BUN and creatinine.

## 2023-04-18 ENCOUNTER — Other Ambulatory Visit (INDEPENDENT_AMBULATORY_CARE_PROVIDER_SITE_OTHER): Payer: 59

## 2023-04-18 DIAGNOSIS — I10 Essential (primary) hypertension: Secondary | ICD-10-CM

## 2023-04-18 DIAGNOSIS — E7800 Pure hypercholesterolemia, unspecified: Secondary | ICD-10-CM | POA: Diagnosis not present

## 2023-04-18 LAB — CBC WITH DIFFERENTIAL/PLATELET
Basophils Absolute: 0 10*3/uL (ref 0.0–0.1)
Basophils Relative: 1 % (ref 0.0–3.0)
Eosinophils Absolute: 0.1 10*3/uL (ref 0.0–0.7)
Eosinophils Relative: 3.1 % (ref 0.0–5.0)
HCT: 38.2 % (ref 36.0–46.0)
Hemoglobin: 12.3 g/dL (ref 12.0–15.0)
Lymphocytes Relative: 52.5 % — ABNORMAL HIGH (ref 12.0–46.0)
Lymphs Abs: 2.3 10*3/uL (ref 0.7–4.0)
MCHC: 32.3 g/dL (ref 30.0–36.0)
MCV: 95.6 fL (ref 78.0–100.0)
Monocytes Absolute: 0.4 10*3/uL (ref 0.1–1.0)
Monocytes Relative: 9.6 % (ref 3.0–12.0)
Neutro Abs: 1.5 10*3/uL (ref 1.4–7.7)
Neutrophils Relative %: 33.8 % — ABNORMAL LOW (ref 43.0–77.0)
Platelets: 262 10*3/uL (ref 150.0–400.0)
RBC: 4 Mil/uL (ref 3.87–5.11)
RDW: 13.1 % (ref 11.5–15.5)
WBC: 4.4 10*3/uL (ref 4.0–10.5)

## 2023-04-18 LAB — LIPID PANEL
Cholesterol: 210 mg/dL — ABNORMAL HIGH (ref 0–200)
HDL: 70.4 mg/dL (ref >39.00)
LDL Cholesterol: 125 mg/dL — ABNORMAL HIGH (ref 0–99)
NonHDL: 139.44
Total CHOL/HDL Ratio: 3
Triglycerides: 74 mg/dL (ref 0.0–149.0)
VLDL: 14.8 mg/dL (ref 0.0–40.0)

## 2023-04-18 LAB — BASIC METABOLIC PANEL
BUN: 18 mg/dL (ref 6–23)
CO2: 28 meq/L (ref 19–32)
Calcium: 9.3 mg/dL (ref 8.4–10.5)
Chloride: 105 meq/L (ref 96–112)
Creatinine, Ser: 0.93 mg/dL (ref 0.40–1.20)
GFR: 64.23 mL/min (ref >60.00)
Glucose, Bld: 92 mg/dL (ref 70–99)
Potassium: 4.7 meq/L (ref 3.5–5.1)
Sodium: 138 meq/L (ref 135–145)

## 2023-04-18 LAB — HEPATIC FUNCTION PANEL
ALT: 11 U/L (ref 0–35)
AST: 17 U/L (ref 0–37)
Albumin: 4.2 g/dL (ref 3.5–5.2)
Alkaline Phosphatase: 54 U/L (ref 39–117)
Bilirubin, Direct: 0.1 mg/dL (ref 0.0–0.3)
Total Bilirubin: 0.9 mg/dL (ref 0.2–1.2)
Total Protein: 6.9 g/dL (ref 6.0–8.3)

## 2023-04-21 ENCOUNTER — Encounter: Payer: 59 | Admitting: Internal Medicine

## 2023-04-21 NOTE — Assessment & Plan Note (Deleted)
Physical today 04/21/23.  Mammogram 06/28/22 - Birads I. Colonoscopy 06/2018.

## 2023-04-21 NOTE — Progress Notes (Deleted)
Subjective:    Patient ID: Angel Taylor, female    DOB: 09-Feb-1956, 68 y.o.   MRN: [id number]  Patient here for No chief complaint on file.   HPI Here for a physical exam. Seeing [facility] for neck pain and intermittent numbness and tingling in both hands. Cervical xrays showed diffuse cervical spondylosis and DDD with retrolisthesis C4-C4 and C5-C6. Some movement at C5-C6 with flex/ext (approximately 2mm). EMG - right carpal tunnel syndrome. Recommended PT, carpal tunnel braces.    Past Medical History:  Diagnosis Date   Anemia    Chronic headaches    DDD (degenerative disc disease), cervical    Fatty liver    Frequent UTI    GERD (gastroesophageal reflux disease)    Glaucoma (increased eye pressure)    Hypercholesterolemia    Pre-eclampsia    Submandibular gland mass    right   Past Surgical History:  Procedure Laterality Date   ABDOMINAL HYSTERECTOMY     total   CESAREAN SECTION  04/05/1985   COLONOSCOPY     MASS EXCISION Right 02/17/2022   Procedure: EXCISION SUBMANDIBULAR MASS;  Surgeon: Geanie Logan, MD;  Location: [facility] ORS;  Service: ENT;  Laterality: Right;   TUBAL LIGATION  04/05/1985   Family History  Problem Relation Age of Onset   Diabetes Mother    Alzheimer's disease Mother    Seizures Mother    Heart disease Father    Breast cancer Neg Hx    Social History   Socioeconomic History   Marital status: Married    Spouse name: Not on file   Number of children: 2   Years of education: Not on file   Highest education level: Not on file  Occupational History   Not on file  Tobacco Use   Smoking status: Never   Smokeless tobacco: Never  Vaping Use   Vaping status: Never Used  Substance and Sexual Activity   Alcohol use: No    Alcohol/week: 0.0 standard drinks of alcohol   Drug use: No   Sexual activity: Not on file  Other Topics Concern   Not on file  Social History Narrative   Not on file   Social Drivers of Health   Financial Resource Strain:  Low Risk  (06/23/2022)   Overall Financial Resource Strain (CARDIA)    Difficulty of Paying Living Expenses: Not very hard  Food Insecurity: No Food Insecurity (06/23/2022)   Hunger Vital Sign    Worried About Running Out of Food in the Last Year: Never true    Ran Out of Food in the Last Year: Never true  Transportation Needs: No Transportation Needs (06/23/2022)   PRAPARE - Administrator, Civil Service (Medical): No    Lack of Transportation (Non-Medical): No  Physical Activity: Insufficiently Active (06/23/2022)   Exercise Vital Sign    Days of Exercise per Week: 3 days    Minutes of Exercise per Session: 20 min  Stress: No Stress Concern Present (06/23/2022)   [employer] of Occupational Health - Occupational Stress Questionnaire    Feeling of Stress : Only a little  Social Connections: Socially Integrated (06/23/2022)   Social Connection and Isolation Panel [NHANES]    Frequency of Communication with Friends and Family: Three times a week    Frequency of Social Gatherings with Friends and Family: Once a week    Attends Religious Services: More than 4 times per year    Active Member of Clubs or Organizations: Yes  Attends Banker Meetings: More than 4 times per year    Marital Status: Married     Review of Systems     Objective:     There were no vitals taken for this visit. Wt Readings from Last 3 Encounters:  02/17/23 111 lb (50.3 kg)  01/28/23 110 lb 6.4 oz (50.1 kg)  09/28/22 109 lb 9.6 oz (49.7 kg)    Physical Exam  {Perform Simple Foot Exam  Perform Detailed exam:1} {Insert foot Exam (Optional):30965}   Outpatient Encounter Medications as of 04/21/2023  Medication Sig   Calcium-Vitamin D-Vitamin K (VIACTIV CALCIUM PLUS D) 650-12.5-40 MG-MCG-MCG CHEW Chew 2 capsules by mouth daily.   diphenhydrAMINE (BENADRYL) 25 MG tablet Take 25 mg by mouth every 6 (six) hours as needed for allergies.   famotidine (PEPCID) 20 MG tablet Take 1  tablet (20 mg total) by mouth daily.   latanoprost (XALATAN) 0.005 % ophthalmic solution Place 1 drop into both eyes at bedtime.   metoprolol succinate (TOPROL-XL) 25 MG 24 hr tablet Take 0.5 tablets (12.5 mg total) by mouth daily.   multivitamin-iron-minerals-folic acid (CENTRUM) chewable tablet Chew 1 tablet by mouth daily.   telmisartan (MICARDIS) 80 MG tablet Take 1 tablet (80 mg total) by mouth daily.   timolol (TIMOPTIC) 0.25 % ophthalmic solution Place 1 drop into both eyes every morning.   No facility-administered encounter medications on file as of 04/21/2023.     Lab Results  Component Value Date   WBC 4.4 04/18/2023   HGB 12.3 04/18/2023   HCT 38.2 04/18/2023   PLT 262.0 04/18/2023   GLUCOSE 92 04/18/2023   CHOL 210 (H) 04/18/2023   TRIG 74.0 04/18/2023   HDL 70.40 04/18/2023   LDLDIRECT 119.7 05/30/2012   LDLCALC 125 (H) 04/18/2023   ALT 11 04/18/2023   AST 17 04/18/2023   NA 138 04/18/2023   K 4.7 04/18/2023   CL 105 04/18/2023   CREATININE 0.93 04/18/2023   BUN 18 04/18/2023   CO2 28 04/18/2023   TSH 1.77 04/14/2022    DG Cervical Spine Complete Result Date: 03/09/2023 CLINICAL DATA:  neck pain EXAM: CERVICAL SPINE - COMPLETE 4+ VIEW COMPARISON:  February 02, 2019 FINDINGS: The cervical spine is visualized from C1-C7. Straightening of the cervical lordosis with trace retrolisthesis at C4-5 on neutral imaging which is similar on flexion and extension views. There is approximately 2 mm of retrolisthesis on extension at C5-6. This normalizes with flexion or neutral positioning. Vertebral body heights are maintained: no evidence of acute fracture. Moderate intervertebral disc space height loss at C3-4, C4-5 and C5-6. Mild to moderate intervertebral disc space height loss at C6-7. Multilevel uncovertebral hypertrophy and facet arthropathy. No prevertebral soft tissue swelling. Visualized thorax is unremarkable. IMPRESSION: 1. Multilevel degenerative changes of the cervical  spine most pronounced at C3-4, C4-5 and C5-6. 2. Retrolisthesis of C4-5 and C5-6 described above. Electronically Signed   By: Meda Klinefelter M.D.   On: 03/09/2023 15:36       Assessment & Plan:  There are no diagnoses linked to this encounter.   Dale Mill[place], MD

## 2023-04-28 ENCOUNTER — Encounter: Payer: Self-pay | Admitting: Internal Medicine

## 2023-04-28 ENCOUNTER — Ambulatory Visit: Payer: 59 | Admitting: Internal Medicine

## 2023-04-28 VITALS — BP 128/72 | HR 63 | Temp 97.3°F | Ht 62.0 in | Wt 112.6 lb

## 2023-04-28 DIAGNOSIS — F439 Reaction to severe stress, unspecified: Secondary | ICD-10-CM | POA: Diagnosis not present

## 2023-04-28 DIAGNOSIS — Z0000 Encounter for general adult medical examination without abnormal findings: Secondary | ICD-10-CM | POA: Diagnosis not present

## 2023-04-28 DIAGNOSIS — J329 Chronic sinusitis, unspecified: Secondary | ICD-10-CM

## 2023-04-28 DIAGNOSIS — E7800 Pure hypercholesterolemia, unspecified: Secondary | ICD-10-CM

## 2023-04-28 DIAGNOSIS — Z1231 Encounter for screening mammogram for malignant neoplasm of breast: Secondary | ICD-10-CM

## 2023-04-28 DIAGNOSIS — K760 Fatty (change of) liver, not elsewhere classified: Secondary | ICD-10-CM

## 2023-04-28 DIAGNOSIS — M542 Cervicalgia: Secondary | ICD-10-CM | POA: Diagnosis not present

## 2023-04-28 DIAGNOSIS — I10 Essential (primary) hypertension: Secondary | ICD-10-CM

## 2023-04-28 NOTE — Assessment & Plan Note (Signed)
Physical today 04/28/23.  Mammogram 06/28/22 - Birads I. Colonoscopy 06/2018.

## 2023-04-28 NOTE — Progress Notes (Signed)
Subjective:    Patient ID: Angel Taylor, female    DOB: 18-Jan-1956, 68 y.o.   MRN: [id number]  Patient here for  Chief Complaint  Patient presents with   Annual Exam    HPI Here for a physical exam. Has been having increased neck pain. Saw [facility] 02/17/23 - recommended C-spine xray and NCS. C-spine xray revealed multilevel degenerative changes of the cervical spine most pronounced at C3-4, C4-5 and C5-6. Retrolisthesis of C4-5 and C5-6 described above. EMG - right median mononeuropathy at or distal to the wrist c/w CTS. No evidence of left median mononeuropathy. No evidence of right or left cervical motor radiculopathy. Recommended PT. Also carpal tunnel braces for both hands. Planning to start PT 05/10/23. Evaluated 04/21/23 - diagnosed with sinus infection. Treated with augmentin and prednisone. Right ear better. Breathing stable. No sob. No abdominal pain or bowel change reported.    Past Medical History:  Diagnosis Date   Anemia    Chronic headaches    DDD (degenerative disc disease), cervical    Fatty liver    Frequent UTI    GERD (gastroesophageal reflux disease)    Glaucoma (increased eye pressure)    Hypercholesterolemia    Pre-eclampsia    Submandibular gland mass    right   Past Surgical History:  Procedure Laterality Date   ABDOMINAL HYSTERECTOMY     total   CESAREAN SECTION  04/05/1985   COLONOSCOPY     MASS EXCISION Right 02/17/2022   Procedure: EXCISION SUBMANDIBULAR MASS;  Surgeon: Geanie Logan, MD;  Location: [facility] ORS;  Service: ENT;  Laterality: Right;   TUBAL LIGATION  04/05/1985   Family History  Problem Relation Age of Onset   Diabetes Mother    Alzheimer's disease Mother    Seizures Mother    Heart disease Father    Breast cancer Neg Hx    Social History   Socioeconomic History   Marital status: Married    Spouse name: Not on file   Number of children: 2   Years of education: Not on file   Highest education level: Not on file  Occupational  History   Not on file  Tobacco Use   Smoking status: Never   Smokeless tobacco: Never  Vaping Use   Vaping status: Never Used  Substance and Sexual Activity   Alcohol use: No    Alcohol/week: 0.0 standard drinks of alcohol   Drug use: No   Sexual activity: Not on file  Other Topics Concern   Not on file  Social History Narrative   Not on file   Social Drivers of Health   Financial Resource Strain: Low Risk  (06/23/2022)   Overall Financial Resource Strain (CARDIA)    Difficulty of Paying Living Expenses: Not very hard  Food Insecurity: No Food Insecurity (06/23/2022)   Hunger Vital Sign    Worried About Running Out of Food in the Last Year: Never true    Ran Out of Food in the Last Year: Never true  Transportation Needs: No Transportation Needs (06/23/2022)   PRAPARE - Administrator, Civil Service (Medical): No    Lack of Transportation (Non-Medical): No  Physical Activity: Insufficiently Active (06/23/2022)   Exercise Vital Sign    Days of Exercise per Week: 3 days    Minutes of Exercise per Session: 20 min  Stress: No Stress Concern Present (06/23/2022)   [employer] of Occupational Health - Occupational Stress Questionnaire    Feeling of Stress :  Only a little  Social Connections: Socially Integrated (06/23/2022)   Social Connection and Isolation Panel [NHANES]    Frequency of Communication with Friends and Family: Three times a week    Frequency of Social Gatherings with Friends and Family: Once a week    Attends Religious Services: More than 4 times per year    Active Member of [employer] or Organizations: Yes    Attends Engineer, structural: More than 4 times per year    Marital Status: Married     Review of Systems  Constitutional:  Negative for appetite change and unexpected weight change.  HENT:  Negative for congestion, sinus pressure and sore throat.   Eyes:  Negative for pain and visual disturbance.  Respiratory:  Negative for cough,  chest tightness and shortness of breath.   Cardiovascular:  Negative for chest pain, palpitations and leg swelling.  Gastrointestinal:  Negative for abdominal pain, diarrhea, nausea and vomiting.  Genitourinary:  Negative for difficulty urinating and dysuria.  Musculoskeletal:  Negative for joint swelling and myalgias.  Skin:  Negative for color change and rash.  Neurological:  Negative for dizziness and headaches.  Hematological:  Negative for adenopathy. Does not bruise/bleed easily.  Psychiatric/Behavioral:  Negative for agitation and dysphoric mood.        Objective:     BP 128/72   Pulse 63   Temp (!) 97.3 F (36.3 C) (Oral)   Ht 5\' 2"  (1.575 m)   Wt 112 lb 9.6 oz (51.1 kg)   SpO2 99%   BMI 20.59 kg/m  Wt Readings from Last 3 Encounters:  04/28/23 112 lb 9.6 oz (51.1 kg)  02/17/23 111 lb (50.3 kg)  01/28/23 110 lb 6.4 oz (50.1 kg)    Physical Exam Vitals reviewed.  Constitutional:      General: She is not in acute distress.    Appearance: Normal appearance. She is well-developed.  HENT:     Head: Normocephalic and atraumatic.     Right Ear: External ear normal.     Left Ear: External ear normal.     Mouth/Throat:     Pharynx: No oropharyngeal exudate or posterior oropharyngeal erythema.  Eyes:     General: No scleral icterus.       Right eye: No discharge.        Left eye: No discharge.     Conjunctiva/sclera: Conjunctivae normal.  Neck:     Thyroid: No thyromegaly.  Cardiovascular:     Rate and Rhythm: Normal rate and regular rhythm.  Pulmonary:     Effort: No tachypnea, accessory muscle usage or respiratory distress.     Breath sounds: Normal breath sounds. No decreased breath sounds or wheezing.  Chest:  Breasts:    Right: No inverted nipple, mass, nipple discharge or tenderness (no axillary adenopathy).     Left: No inverted nipple, mass, nipple discharge or tenderness (no axilarry adenopathy).  Abdominal:     General: Bowel sounds are normal.      Palpations: Abdomen is soft.     Tenderness: There is no abdominal tenderness.  Musculoskeletal:        General: No swelling or tenderness.     Cervical back: Neck supple.  Lymphadenopathy:     Cervical: No cervical adenopathy.  Skin:    Findings: No erythema or rash.  Neurological:     Mental Status: She is alert and oriented to person, place, and time.  Psychiatric:        Mood and Affect:  Mood normal.        Behavior: Behavior normal.         Outpatient Encounter Medications as of 04/28/2023  Medication Sig   amoxicillin-clavulanate (AUGMENTIN) 875-125 MG tablet Take by mouth.   Calcium-Vitamin D-Vitamin K (VIACTIV CALCIUM PLUS D) 650-12.5-40 MG-MCG-MCG CHEW Chew 2 capsules by mouth daily.   diphenhydrAMINE (BENADRYL) 25 MG tablet Take 25 mg by mouth every 6 (six) hours as needed for allergies.   famotidine (PEPCID) 20 MG tablet Take 1 tablet (20 mg total) by mouth daily.   latanoprost (XALATAN) 0.005 % ophthalmic solution Place 1 drop into both eyes at bedtime.   metoprolol succinate (TOPROL-XL) 25 MG 24 hr tablet Take 0.5 tablets (12.5 mg total) by mouth daily.   multivitamin-iron-minerals-folic acid (CENTRUM) chewable tablet Chew 1 tablet by mouth daily.   predniSONE (DELTASONE) 10 MG tablet Take 10 mg by mouth daily.   telmisartan (MICARDIS) 80 MG tablet Take 1 tablet (80 mg total) by mouth daily.   timolol (TIMOPTIC) 0.25 % ophthalmic solution Place 1 drop into both eyes every morning.   No facility-administered encounter medications on file as of 04/28/2023.     Lab Results  Component Value Date   WBC 4.4 04/18/2023   HGB 12.3 04/18/2023   HCT 38.2 04/18/2023   PLT 262.0 04/18/2023   GLUCOSE 92 04/18/2023   CHOL 210 (H) 04/18/2023   TRIG 74.0 04/18/2023   HDL 70.40 04/18/2023   LDLDIRECT 119.7 05/30/2012   LDLCALC 125 (H) 04/18/2023   ALT 11 04/18/2023   AST 17 04/18/2023   NA 138 04/18/2023   K 4.7 04/18/2023   CL 105 04/18/2023   CREATININE 0.93  04/18/2023   BUN 18 04/18/2023   CO2 28 04/18/2023   TSH 1.77 04/14/2022    DG Cervical Spine Complete Result Date: 03/09/2023 CLINICAL DATA:  neck pain EXAM: CERVICAL SPINE - COMPLETE 4+ VIEW COMPARISON:  February 02, 2019 FINDINGS: The cervical spine is visualized from C1-C7. Straightening of the cervical lordosis with trace retrolisthesis at C4-5 on neutral imaging which is similar on flexion and extension views. There is approximately 2 mm of retrolisthesis on extension at C5-6. This normalizes with flexion or neutral positioning. Vertebral body heights are maintained: no evidence of acute fracture. Moderate intervertebral disc space height loss at C3-4, C4-5 and C5-6. Mild to moderate intervertebral disc space height loss at C6-7. Multilevel uncovertebral hypertrophy and facet arthropathy. No prevertebral soft tissue swelling. Visualized thorax is unremarkable. IMPRESSION: 1. Multilevel degenerative changes of the cervical spine most pronounced at C3-4, C4-5 and C5-6. 2. Retrolisthesis of C4-5 and C5-6 described above. Electronically Signed   By: Meda Klinefelter M.D.   On: 03/09/2023 15:36       Assessment & Plan:  Stress Assessment & Plan: Appears to be handling things relatively well.  Follow.     Encounter for screening mammogram for malignant neoplasm of breast -     3D Screening Mammogram, Left and Right; Future  Health care maintenance Assessment & Plan: Physical today 04/28/23.  Mammogram 06/28/22 - Birads I. Colonoscopy 06/2018.    Neck pain Assessment & Plan:  Saw [facility] 02/17/23 - recommended C-spine xray and NCS. C-spine xray revealed multilevel degenerative changes of the cervical spine most pronounced at C3-4, C4-5 and C5-6. Retrolisthesis of C4-5 and C5-6 described above. EMG - right median mononeuropathy at or distal to the wrist c/w CTS. No evidence of left median mononeuropathy. No evidence of right or left cervical motor radiculopathy. Recommended  PT. Also carpal tunnel  braces for both hands. Planning to start PT 05/10/23.   Hypertension, essential Assessment & Plan: Blood pressure as outlined.  On micardis 80mg . Follow pressures.  Follow metabolic panel.    Hypercholesterolemia Assessment & Plan: The 10-year ASCVD risk score (Arnett DK, et al., 2019) is: 5.8%   Values used to calculate the score:     Age: 58 years     Sex: Female     Is Non-Hispanic African American: Yes     Diabetic: No     Tobacco smoker: No     Systolic Blood Pressure: 102 mmHg     Is BP treated: Yes     HDL Cholesterol: 70.4 mg/dL     Total Cholesterol: 210 mg/dL  Discussed calculated cholesterol risk.  Will notify me if desires to start cholesterol medication.    Fatty liver Assessment & Plan: Previous ultrasound revealed fatty liver.  Follow liver function tests.     Sinusitis, unspecified chronicity, unspecified location Assessment & Plan: Evaluated 04/21/23. Treated with augmentin and prednisone. Doing better.  Follow.       Dale Castleberry, MD

## 2023-05-01 ENCOUNTER — Encounter: Payer: Self-pay | Admitting: Internal Medicine

## 2023-05-01 DIAGNOSIS — J329 Chronic sinusitis, unspecified: Secondary | ICD-10-CM | POA: Insufficient documentation

## 2023-05-01 NOTE — Assessment & Plan Note (Signed)
Saw [facility] 02/17/23 - recommended C-spine xray and NCS. C-spine xray revealed multilevel degenerative changes of the cervical spine most pronounced at C3-4, C4-5 and C5-6. Retrolisthesis of C4-5 and C5-6 described above. EMG - right median mononeuropathy at or distal to the wrist c/w CTS. No evidence of left median mononeuropathy. No evidence of right or left cervical motor radiculopathy. Recommended PT. Also carpal tunnel braces for both hands. Planning to start PT 05/10/23.

## 2023-05-01 NOTE — Assessment & Plan Note (Signed)
The 10-year ASCVD risk score (Arnett DK, et al., 2019) is: 5.8%   Values used to calculate the score:     Age: 68 years     Sex: Female     Is Non-Hispanic African American: Yes     Diabetic: No     Tobacco smoker: No     Systolic Blood Pressure: 102 mmHg     Is BP treated: Yes     HDL Cholesterol: 70.4 mg/dL     Total Cholesterol: 210 mg/dL  Discussed calculated cholesterol risk.  Will notify me if desires to start cholesterol medication.

## 2023-05-01 NOTE — Assessment & Plan Note (Signed)
Previous ultrasound revealed fatty liver.  Follow liver function tests.

## 2023-05-01 NOTE — Assessment & Plan Note (Signed)
Blood pressure as outlined.  On micardis 80mg . Follow pressures.  Follow metabolic panel.

## 2023-05-01 NOTE — Assessment & Plan Note (Signed)
Evaluated 04/21/23. Treated with augmentin and prednisone. Doing better.  Follow.

## 2023-05-01 NOTE — Assessment & Plan Note (Signed)
Appears to be handling things relatively well.  Follow.

## 2023-06-10 NOTE — Progress Notes (Deleted)
 Referring Physician:  Dale Geneva, MD [address] [place],  [place] [address]  Primary Physician:  Dale Sabula, MD  History of Present Illness: 06/10/2023 [name] has a history of HTN, hypercholesterolemia, fatty liver, GERD, osteoporosis, and glaucoma.  I did a phone visit with her on 04/15/23. She continued with constant neck pain with no arm pain. She has known diffuse cervical spondylosis and DDD with retrolisthesis C4-C4 and C5-C6. Some movement at C5-C6 with flex/ext (approximately 2mm).   She also had numbness/tingling in both hands that was intermittent. EMG showed right carpal tunnel syndrome.   She was sent to PT and advised to get carpal tunnel braces. She is here for follow up.   She had initial eval with [facility] on 05/10/23 and has done 7 visits through 06/07/23.***    She continues with constant neck pain with no arm pain. She has improvement with massage. She also continues with intermittent numbness and tingling in both hands that is worse in the morning and at night, but she also gets it during the day. No arm pain.    She only tolerates tylenol, other NSAIDS cause GI issues.     Bowel/Bladder Dysfunction: none  Conservative measures:  Physical therapy: initial eval at [facility] on 05/10/23 and has done 7 visits through 06/07/23*** Multimodal medical therapy including regular antiinflammatories: tylenol  Injections: no epidural steroid injections  Past Surgery: no  [name] has no symptoms of cervical myelopathy.  The symptoms are causing a significant impact on the patient's life.   Review of Systems:  A 10 point review of systems is negative, except for the pertinent positives and negatives detailed in the HPI.  Past Medical History: Past Medical History:  Diagnosis Date   Anemia    Chronic headaches    DDD (degenerative disc disease), cervical    Fatty liver    Frequent UTI    GERD (gastroesophageal reflux disease)     Glaucoma (increased eye pressure)    Hypercholesterolemia    Pre-eclampsia    Submandibular gland mass    right    Past Surgical History: Past Surgical History:  Procedure Laterality Date   ABDOMINAL HYSTERECTOMY     total   CESAREAN SECTION  04/05/1985   COLONOSCOPY     MASS EXCISION Right 02/17/2022   Procedure: EXCISION SUBMANDIBULAR MASS;  Surgeon: Geanie Logan, MD;  Location: [facility] ORS;  Service: ENT;  Laterality: Right;   TUBAL LIGATION  04/05/1985    Allergies: Allergies as of 06/17/2023 - Review Complete 05/01/2023  Allergen Reaction Noted   Bactrim [sulfamethoxazole-trimethoprim]  05/29/2012   Macrobid [nitrofurantoin macrocrystal]  05/29/2012   Other Hives 02/15/2022   Sulfa antibiotics  05/29/2012    Medications: Outpatient Encounter Medications as of 06/17/2023  Medication Sig   Calcium-Vitamin D-Vitamin K (VIACTIV CALCIUM PLUS D) 650-12.5-40 MG-MCG-MCG CHEW Chew 2 capsules by mouth daily.   diphenhydrAMINE (BENADRYL) 25 MG tablet Take 25 mg by mouth every 6 (six) hours as needed for allergies.   famotidine (PEPCID) 20 MG tablet Take 1 tablet (20 mg total) by mouth daily.   latanoprost (XALATAN) 0.005 % ophthalmic solution Place 1 drop into both eyes at bedtime.   metoprolol succinate (TOPROL-XL) 25 MG 24 hr tablet Take 0.5 tablets (12.5 mg total) by mouth daily.   multivitamin-iron-minerals-folic acid (CENTRUM) chewable tablet Chew 1 tablet by mouth daily.   predniSONE (DELTASONE) 10 MG tablet Take 10 mg by mouth daily.   telmisartan (MICARDIS) 80  MG tablet Take 1 tablet (80 mg total) by mouth daily.   timolol (TIMOPTIC) 0.25 % ophthalmic solution Place 1 drop into both eyes every morning.   No facility-administered encounter medications on file as of 06/17/2023.    Social History: Social History   Tobacco Use   Smoking status: Never   Smokeless tobacco: Never  Vaping Use   Vaping status: Never Used  Substance Use Topics   Alcohol use: No     Alcohol/week: 0.0 standard drinks of alcohol   Drug use: No    Family Medical History: Family History  Problem Relation Age of Onset   Diabetes Mother    Alzheimer's disease Mother    Seizures Mother    Heart disease Father    Breast cancer Neg Hx     Physical Examination: There were no vitals filed for this visit.    Awake, alert, oriented to person, place, and time.  Speech is clear and fluent. Fund of knowledge is appropriate.   Cranial Nerves: Pupils equal round and reactive to light.  Facial tone is symmetric.    No posterior cervical tenderness. Mild tenderness in bilateral trapezial region.   Good range of motion of bilateral shoulders with no pain.  No pain with internal rotation/external rotation.  No abnormal lesions on exposed skin.   Strength: Side Biceps Triceps Deltoid Interossei Grip Wrist Ext. Wrist Flex.  R 5 5 5 5 5 5 5   L 5 5 5 5 5 5 5    Side Iliopsoas Quads Hamstring PF DF EHL  R 5 5 5 5 5 5   L 5 5 5 5 5 5    Reflexes are 2+ and symmetric at the biceps, brachioradialis, patella and achilles.   Hoffman's is absent.  Clonus is not present.   Bilateral upper and lower extremity sensation is intact to light touch.    Negative Tinel's at the wrist and elbow bilaterally.  Phalen's is positive at the wrist bilaterally.  Gait is normal.    Medical Decision Making  Imaging: None  Assessment and Plan: [name] continues with constant neck pain with no arm pain.    She has known diffuse cervical spondylosis and DDD with retrolisthesis C4-C4 and C5-C6. Some movement at C5-C6 with flex/ext (approximately 2mm).    She also continues with intermittent numbness and tingling in both hands that is worse in the morning and at night, but she also gets it during the day. No arm pain.    EMG showed right carpal tunnel syndrome.    Treatment options discussed with patient and following plan made:    - PT for cervical spine. Orders to [facility].  - She will  get new carpal tunnel braces for both hands and wear at night.  - Discussed possible carpal tunnel injection on right and surgery. She wants to try bracing first.  - Follow up with me in 8-12 weeks for recheck.   I spent a total of *** minutes in face-to-face and non-face-to-face activities related to this patient's care today including review of outside records, review of imaging, review of symptoms, physical exam, discussion of differential diagnosis, discussion of treatment options, and documentation.   Drake Leach PA-C Dept. of Neurosurgery

## 2023-06-17 ENCOUNTER — Ambulatory Visit: Payer: 59 | Admitting: Orthopedic Surgery

## 2023-07-01 ENCOUNTER — Ambulatory Visit
Admission: RE | Admit: 2023-07-01 | Discharge: 2023-07-01 | Disposition: A | Discharge status: Home / Self Care | Source: Ambulatory Visit | Attending: Internal Medicine | Admitting: Internal Medicine

## 2023-07-01 DIAGNOSIS — Z1231 Encounter for screening mammogram for malignant neoplasm of breast: Secondary | ICD-10-CM | POA: Insufficient documentation

## 2023-07-16 ENCOUNTER — Other Ambulatory Visit: Payer: Self-pay | Admitting: Internal Medicine

## 2023-07-27 ENCOUNTER — Other Ambulatory Visit: Payer: Self-pay

## 2023-07-27 ENCOUNTER — Telehealth (INDEPENDENT_AMBULATORY_CARE_PROVIDER_SITE_OTHER): Admitting: Internal Medicine

## 2023-07-27 ENCOUNTER — Telehealth: Payer: Self-pay

## 2023-07-27 VITALS — Ht 62.0 in | Wt 112.0 lb

## 2023-07-27 DIAGNOSIS — F439 Reaction to severe stress, unspecified: Secondary | ICD-10-CM

## 2023-07-27 DIAGNOSIS — E7800 Pure hypercholesterolemia, unspecified: Secondary | ICD-10-CM | POA: Diagnosis not present

## 2023-07-27 DIAGNOSIS — K219 Gastro-esophageal reflux disease without esophagitis: Secondary | ICD-10-CM | POA: Diagnosis not present

## 2023-07-27 DIAGNOSIS — I10 Essential (primary) hypertension: Secondary | ICD-10-CM | POA: Diagnosis not present

## 2023-07-27 MED ORDER — AMLODIPINE BESYLATE 2.5 MG PO TABS: 2.5000 mg | ORAL_TABLET | Freq: Every day | ORAL | 2 refills | Status: DC

## 2023-07-27 MED ORDER — TELMISARTAN 40 MG PO TABS: 40.0000 mg | ORAL_TABLET | Freq: Every day | ORAL | 2 refills | Status: DC

## 2023-07-27 NOTE — Telephone Encounter (Signed)
 Patient scheduled a virtual visit with Dr. Dellar Fenton for today via MyChart with the following comment:  "Patient comments: Hi Dr Geralyn Knee, it seems my blood pressure is not remaining normal. this morning is was 147/87 pulse 59. This afternoon it was 170/89 pulse 63. lately I've had more reflux symptoms or I think so.  It also seems I feel better before taking the Telmisartan   at night. My heart seems to race a bit or a little irregular just feels different. Maybe a lesser dosage may help. I haven't been taking my blood pressure every day."

## 2023-07-27 NOTE — Progress Notes (Signed)
 Patient ID: Angel Taylor, female   DOB: 1956/03/03, 68 y.o.   MRN: [id number]   Virtual Visit via video Note  I connected with Angel Taylor by a video enabled telemedicine application and verified that I am speaking with the correct person using two identifiers. Location patient: home Location provider: work  Persons participating in the virtual visit: patient, provider  The limitations, risks, security and privacy concerns of performing an evaluation and management service by video and the availability of in person appointments have been discussed. It has also been discussed with the patient that there may be a patient responsible charge related to this service. The patient expressed understanding and agreed to proceed.   Reason for visit: work in appt.   HPI: Concerned regarding elevated blood pressure. Reviewed blood pressure readings - 131/83, 155/91, 137/92, 145/70. No chest pain or sob reported. Not sure if tolerating micardis . Discussed changing blood pressure medication. No vomiting or diarrhea. Bowels stable.    ROS: See pertinent positives and negatives per HPI.  Past Medical History:  Diagnosis Date   Anemia    Chronic headaches    DDD (degenerative disc disease), cervical    Fatty liver    Frequent UTI    GERD (gastroesophageal reflux disease)    Glaucoma (increased eye pressure)    Hypercholesterolemia    Pre-eclampsia    Submandibular gland mass    right    Past Surgical History:  Procedure Laterality Date   ABDOMINAL HYSTERECTOMY     total   CESAREAN SECTION  04/05/1985   COLONOSCOPY     MASS EXCISION Right 02/17/2022   Procedure: EXCISION SUBMANDIBULAR MASS;  Surgeon: Angel Grumbling, MD;  Location: [facility] ORS;  Service: ENT;  Laterality: Right;   TUBAL LIGATION  04/05/1985    Family History  Problem Relation Age of Onset   Diabetes Mother    Alzheimer's disease Mother    Seizures Mother    Heart disease Father    Breast cancer Neg Hx     SOCIAL HX:  reviewed.   Current Outpatient Medications:    amLODipine  (NORVASC ) 2.5 MG tablet, Take 1 tablet (2.5 mg total) by mouth daily., Disp: 30 tablet, Rfl: 2   Calcium-Vitamin D-Vitamin K (VIACTIV CALCIUM PLUS D) 650-12.5-40 MG-MCG-MCG CHEW, Chew 2 capsules by mouth daily., Disp: , Rfl:    diphenhydrAMINE (BENADRYL) 25 MG tablet, Take 25 mg by mouth every 6 (six) hours as needed for allergies., Disp: , Rfl:    famotidine  (PEPCID ) 20 MG tablet, TAKE 1 TABLET BY MOUTH DAILY, Disp: 90 tablet, Rfl: 3   latanoprost  (XALATAN ) 0.005 % ophthalmic solution, Place 1 drop into both eyes at bedtime., Disp: , Rfl:    metoprolol  succinate (TOPROL -XL) 25 MG 24 hr tablet, Take 0.5 tablets (12.5 mg total) by mouth daily., Disp: 45 tablet, Rfl: 3   multivitamin-iron-minerals-folic acid (CENTRUM) chewable tablet, Chew 1 tablet by mouth daily., Disp: , Rfl:    telmisartan  (MICARDIS ) 40 MG tablet, Take 1 tablet (40 mg total) by mouth daily., Disp: 30 tablet, Rfl: 2   timolol  (TIMOPTIC ) 0.25 % ophthalmic solution, Place 1 drop into both eyes every morning., Disp: , Rfl:   EXAM:  GENERAL: alert, oriented, appears well and in no acute distress  HEENT: atraumatic, conjunttiva clear, no obvious abnormalities on inspection of external nose and ears  NECK: normal movements of the head and neck  LUNGS: on inspection no signs of respiratory distress, breathing rate appears normal, no obvious gross SOB, gasping or  wheezing  CV: no obvious cyanosis  PSYCH/NEURO: pleasant and cooperative, no obvious depression or anxiety, speech and thought processing grossly intact  ASSESSMENT AND PLAN:  Discussed the following assessment and plan:  Problem List Items Addressed This Visit     GERD (gastroesophageal reflux disease) - Primary   Continue pepcid .       Hypercholesterolemia   Low cholesterol diet and exercise. Follow lipid panel.       Relevant Medications   amLODipine  (NORVASC ) 2.5 MG tablet   telmisartan   (MICARDIS ) 40 MG tablet   Hypertension, essential   Blood pressures as outlined. Does not feel tolerating higher dose of micardis . Will decrease micardis  to 40mg  q day. Add amlodipine  2.5mg  q day. Follow pressures.       Relevant Medications   amLODipine  (NORVASC ) 2.5 MG tablet   telmisartan  (MICARDIS ) 40 MG tablet   Stress   Appears to be doing well. Follow.        No follow-ups on file.   I discussed the assessment and treatment plan with the patient. The patient was provided an opportunity to ask questions and all were answered. The patient agreed with the plan and demonstrated an understanding of the instructions.   The patient was advised to call back or seek an in-person evaluation if the symptoms worsen or if the condition fails to improve as anticipated.    Dellar Fenton, MD

## 2023-07-27 NOTE — Telephone Encounter (Signed)
 LM for patient

## 2023-07-31 ENCOUNTER — Encounter: Payer: Self-pay | Admitting: Internal Medicine

## 2023-07-31 NOTE — Assessment & Plan Note (Signed)
Appears to be doing well.  Follow.   

## 2023-07-31 NOTE — Assessment & Plan Note (Signed)
 Continue pepcid

## 2023-07-31 NOTE — Assessment & Plan Note (Signed)
 Low cholesterol diet and exercise.  Follow lipid panel.

## 2023-07-31 NOTE — Assessment & Plan Note (Signed)
 Blood pressures as outlined. Does not feel tolerating higher dose of micardis . Will decrease micardis  to 40mg  q day. Add amlodipine  2.5mg  q day. Follow pressures.

## 2023-08-26 ENCOUNTER — Telehealth: Payer: Self-pay | Admitting: *Deleted

## 2023-08-26 DIAGNOSIS — E7800 Pure hypercholesterolemia, unspecified: Secondary | ICD-10-CM

## 2023-08-26 DIAGNOSIS — I10 Essential (primary) hypertension: Secondary | ICD-10-CM

## 2023-08-26 NOTE — Telephone Encounter (Signed)
 Please place future orders for lab appt.

## 2023-08-29 ENCOUNTER — Other Ambulatory Visit: Payer: Self-pay | Admitting: Internal Medicine

## 2023-08-30 NOTE — Telephone Encounter (Signed)
 Rx was discontinued on 07/27/2023. Is it okay to refuse refill request?

## 2023-08-30 NOTE — Telephone Encounter (Signed)
 Lab orders placed.

## 2023-08-31 ENCOUNTER — Ambulatory Visit: Payer: Self-pay | Admitting: Internal Medicine

## 2023-08-31 ENCOUNTER — Other Ambulatory Visit (INDEPENDENT_AMBULATORY_CARE_PROVIDER_SITE_OTHER): Payer: 59

## 2023-08-31 DIAGNOSIS — E7800 Pure hypercholesterolemia, unspecified: Secondary | ICD-10-CM

## 2023-08-31 DIAGNOSIS — I10 Essential (primary) hypertension: Secondary | ICD-10-CM

## 2023-08-31 LAB — BASIC METABOLIC PANEL WITH GFR
BUN: 13 mg/dL (ref 6–23)
CO2: 30 meq/L (ref 19–32)
Calcium: 9.5 mg/dL (ref 8.4–10.5)
Chloride: 103 meq/L (ref 96–112)
Creatinine, Ser: 0.87 mg/dL (ref 0.40–1.20)
GFR: 69.4 mL/min (ref >60.00)
Glucose, Bld: 101 mg/dL — ABNORMAL HIGH (ref 70–99)
Potassium: 4.4 meq/L (ref 3.5–5.1)
Sodium: 138 meq/L (ref 135–145)

## 2023-08-31 LAB — HEPATIC FUNCTION PANEL
ALT: 22 U/L (ref 0–35)
AST: 30 U/L (ref 0–37)
Albumin: 4.3 g/dL (ref 3.5–5.2)
Alkaline Phosphatase: 50 U/L (ref 39–117)
Bilirubin, Direct: 0.1 mg/dL (ref 0.0–0.3)
Total Bilirubin: 0.9 mg/dL (ref 0.2–1.2)
Total Protein: 7.1 g/dL (ref 6.0–8.3)

## 2023-08-31 LAB — LIPID PANEL
Cholesterol: 220 mg/dL — ABNORMAL HIGH (ref 0–200)
HDL: 77.9 mg/dL (ref >39.00)
LDL Cholesterol: 126 mg/dL — ABNORMAL HIGH (ref 0–99)
NonHDL: 141.65
Total CHOL/HDL Ratio: 3
Triglycerides: 76 mg/dL (ref 0.0–149.0)
VLDL: 15.2 mg/dL (ref 0.0–40.0)

## 2023-08-31 LAB — TSH: TSH: 2.09 u[IU]/mL (ref 0.35–5.50)

## 2023-09-02 ENCOUNTER — Encounter: Payer: Self-pay | Admitting: Internal Medicine

## 2023-09-02 ENCOUNTER — Ambulatory Visit: Payer: 59 | Admitting: Internal Medicine

## 2023-09-02 VITALS — BP 130/78 | HR 64 | Ht 62.0 in | Wt 110.8 lb

## 2023-09-02 DIAGNOSIS — M5030 Other cervical disc degeneration, unspecified cervical region: Secondary | ICD-10-CM

## 2023-09-02 DIAGNOSIS — Z860100 Personal history of colon polyps, unspecified: Secondary | ICD-10-CM | POA: Diagnosis not present

## 2023-09-02 DIAGNOSIS — F439 Reaction to severe stress, unspecified: Secondary | ICD-10-CM

## 2023-09-02 DIAGNOSIS — K760 Fatty (change of) liver, not elsewhere classified: Secondary | ICD-10-CM

## 2023-09-02 DIAGNOSIS — E7800 Pure hypercholesterolemia, unspecified: Secondary | ICD-10-CM | POA: Diagnosis not present

## 2023-09-02 DIAGNOSIS — I10 Essential (primary) hypertension: Secondary | ICD-10-CM

## 2023-09-02 MED ORDER — METOPROLOL SUCCINATE ER 25 MG PO TB24: 12.5000 mg | ORAL_TABLET | Freq: Every day | ORAL | 3 refills | Status: AC

## 2023-09-02 NOTE — Assessment & Plan Note (Addendum)
 Low cholesterol diet and exercise. Discussed calculated cholesterol risk. Desires not to start cholesterol medication at this time. Follow lipid panel.  The 10-year ASCVD risk score (Arnett DK, et al., 2019) is: 10%   Values used to calculate the score:     Age: 68 years     Sex: Female     Is Non-Hispanic African American: Yes     Diabetic: No     Tobacco smoker: No     Systolic Blood Pressure: 128 mmHg     Is BP treated: Yes     HDL Cholesterol: 77.9 mg/dL     Total Cholesterol: 220 mg/dL

## 2023-09-02 NOTE — Progress Notes (Signed)
 Subjective:    Patient ID: Angel Taylor, female    DOB: 1956/01/07, 68 y.o.   MRN: [id number]  Patient here for  Chief Complaint  Patient presents with   Medical Management of Chronic Issues    4 month follow up     HPI Here for a scheduled follow up - follow up regarding GERD, hypercholesterolemia and hypertension. Last visit, decreased micardis  to 40mg  q day due to intolerance of 80mg  dose. Amlodipine  2.5mg  added. Blood pressure doing well. Reviewed outside checks. Blood pressures averaging 110-130/70s. Doing well on current regimen. Reports some increased neck pain. Has been to PT. Helped. Plans to do more exercises at home. Will notify me if desires referral to PT. No chest pain or sob reported. No abdominal pain or bowel change reported. Discussed colonoscopy.    Past Medical History:  Diagnosis Date   Anemia    Chronic headaches    DDD (degenerative disc disease), cervical    Fatty liver    Frequent UTI    GERD (gastroesophageal reflux disease)    Glaucoma (increased eye pressure)    Hypercholesterolemia    Pre-eclampsia    Submandibular gland mass    right   Past Surgical History:  Procedure Laterality Date   ABDOMINAL HYSTERECTOMY     total   CESAREAN SECTION  04/05/1985   COLONOSCOPY     MASS EXCISION Right 02/17/2022   Procedure: EXCISION SUBMANDIBULAR MASS;  Surgeon: Von Grumbling, MD;  Location: [facility] ORS;  Service: ENT;  Laterality: Right;   TUBAL LIGATION  04/05/1985   Family History  Problem Relation Age of Onset   Diabetes Mother    Alzheimer's disease Mother    Seizures Mother    Heart disease Father    Breast cancer Neg Hx    Social History   Socioeconomic History   Marital status: Married    Spouse name: Not on file   Number of children: 2   Years of education: Not on file   Highest education level: Bachelor's degree (e.g., BA, AB, BS)  Occupational History   Not on file  Tobacco Use   Smoking status: Never   Smokeless tobacco: Never   Vaping Use   Vaping status: Never Used  Substance and Sexual Activity   Alcohol use: No    Alcohol/week: 0.0 standard drinks of alcohol   Drug use: No   Sexual activity: Not on file  Other Topics Concern   Not on file  Social History Narrative   Not on file   Social Drivers of Health   Financial Resource Strain: Low Risk  (08/30/2023)   Overall Financial Resource Strain (CARDIA)    Difficulty of Paying Living Expenses: Not hard at all  Food Insecurity: No Food Insecurity (08/30/2023)   Hunger Vital Sign    Worried About Running Out of Food in the Last Year: Never true    Ran Out of Food in the Last Year: Never true  Transportation Needs: No Transportation Needs (08/30/2023)   PRAPARE - Administrator, Civil Service (Medical): No    Lack of Transportation (Non-Medical): No  Physical Activity: Insufficiently Active (08/30/2023)   Exercise Vital Sign    Days of Exercise per Week: 2 days    Minutes of Exercise per Session: 20 min  Stress: No Stress Concern Present (08/30/2023)   [employer] of Occupational Health - Occupational Stress Questionnaire    Feeling of Stress : Only a little  Social Connections: Socially Integrated (08/30/2023)  Social Advertising account executive [NHANES]    Frequency of Communication with Friends and Family: More than three times a week    Frequency of Social Gatherings with Friends and Family: Once a week    Attends Religious Services: More than 4 times per year    Active Member of [employer] or Organizations: Yes    Attends Engineer, structural: More than 4 times per year    Marital Status: Married     Review of Systems  Constitutional:  Negative for appetite change and unexpected weight change.  HENT:  Negative for congestion and sinus pressure.   Respiratory:  Negative for cough, chest tightness and shortness of breath.   Cardiovascular:  Negative for chest pain, palpitations and leg swelling.  Gastrointestinal:   Negative for abdominal pain, diarrhea, nausea and vomiting.  Genitourinary:  Negative for difficulty urinating and dysuria.  Musculoskeletal:  Positive for neck pain. Negative for joint swelling and myalgias.  Skin:  Negative for color change and rash.  Neurological:  Negative for dizziness and headaches.  Psychiatric/Behavioral:  Negative for agitation and dysphoric mood.        Objective:     BP 130/78   Pulse 64   Ht 5\' 2"  (1.575 m)   Wt 110 lb 12.8 oz (50.3 kg)   SpO2 98%   BMI 20.27 kg/m  Wt Readings from Last 3 Encounters:  09/02/23 110 lb 12.8 oz (50.3 kg)  07/27/23 112 lb (50.8 kg)  04/28/23 112 lb 9.6 oz (51.1 kg)    Physical Exam Vitals reviewed.  Constitutional:      General: She is not in acute distress.    Appearance: Normal appearance.  HENT:     Head: Normocephalic and atraumatic.     Right Ear: External ear normal.     Left Ear: External ear normal.     Mouth/Throat:     Pharynx: No oropharyngeal exudate or posterior oropharyngeal erythema.  Eyes:     General: No scleral icterus.       Right eye: No discharge.        Left eye: No discharge.     Conjunctiva/sclera: Conjunctivae normal.  Neck:     Thyroid : No thyromegaly.  Cardiovascular:     Rate and Rhythm: Normal rate and regular rhythm.  Pulmonary:     Effort: No respiratory distress.     Breath sounds: Normal breath sounds. No wheezing.  Abdominal:     General: Bowel sounds are normal.     Palpations: Abdomen is soft.     Tenderness: There is no abdominal tenderness.  Musculoskeletal:        General: No swelling or tenderness.     Cervical back: Neck supple. No tenderness.  Lymphadenopathy:     Cervical: No cervical adenopathy.  Skin:    Findings: No erythema or rash.  Neurological:     Mental Status: She is alert.  Psychiatric:        Mood and Affect: Mood normal.        Behavior: Behavior normal.         Outpatient Encounter Medications as of 09/02/2023  Medication Sig    amLODipine  (NORVASC ) 2.5 MG tablet Take 1 tablet (2.5 mg total) by mouth daily.   Calcium-Vitamin D-Vitamin K (VIACTIV CALCIUM PLUS D) 650-12.5-40 MG-MCG-MCG CHEW Chew 2 capsules by mouth daily.   diphenhydrAMINE (BENADRYL) 25 MG tablet Take 25 mg by mouth every 6 (six) hours as needed for allergies.   famotidine  (PEPCID )  20 MG tablet TAKE 1 TABLET BY MOUTH DAILY   latanoprost  (XALATAN ) 0.005 % ophthalmic solution Place 1 drop into both eyes at bedtime.   multivitamin-iron-minerals-folic acid (CENTRUM) chewable tablet Chew 1 tablet by mouth daily.   telmisartan  (MICARDIS ) 40 MG tablet Take 1 tablet (40 mg total) by mouth daily.   timolol  (TIMOPTIC ) 0.25 % ophthalmic solution Place 1 drop into both eyes every morning.   [DISCONTINUED] metoprolol  succinate (TOPROL -XL) 25 MG 24 hr tablet Take 0.5 tablets (12.5 mg total) by mouth daily.   metoprolol  succinate (TOPROL -XL) 25 MG 24 hr tablet Take 0.5 tablets (12.5 mg total) by mouth daily.   No facility-administered encounter medications on file as of 09/02/2023.     Lab Results  Component Value Date   WBC 4.4 04/18/2023   HGB 12.3 04/18/2023   HCT 38.2 04/18/2023   PLT 262.0 04/18/2023   GLUCOSE 101 (H) 08/31/2023   CHOL 220 (H) 08/31/2023   TRIG 76.0 08/31/2023   HDL 77.90 08/31/2023   LDLDIRECT 119.7 05/30/2012   LDLCALC 126 (H) 08/31/2023   ALT 22 08/31/2023   AST 30 08/31/2023   NA 138 08/31/2023   K 4.4 08/31/2023   CL 103 08/31/2023   CREATININE 0.87 08/31/2023   BUN 13 08/31/2023   CO2 30 08/31/2023   TSH 2.09 08/31/2023    MM 3D SCREEN BREAST BILATERAL Result Date: 07/05/2023 CLINICAL DATA:  Screening. EXAM: DIGITAL SCREENING BILATERAL MAMMOGRAM WITH TOMOSYNTHESIS AND CAD TECHNIQUE: Bilateral screening digital craniocaudal and mediolateral oblique mammograms were obtained. Bilateral screening digital breast tomosynthesis was performed. The images were evaluated with computer-aided detection. COMPARISON:  Previous exam(s). ACR  Breast Density Category b: There are scattered areas of fibroglandular density. FINDINGS: There are no findings suspicious for malignancy. IMPRESSION: No mammographic evidence of malignancy. A result letter of this screening mammogram will be mailed directly to the patient. RECOMMENDATION: Screening mammogram in one year. (Code:SM-B-01Y) BI-RADS CATEGORY  1: Negative. Electronically Signed   By: Allena Ito M.D.   On: 07/05/2023 09:47       Assessment & Plan:  Hypercholesterolemia Assessment & Plan: Low cholesterol diet and exercise. Discussed calculated cholesterol risk. Desires not to start cholesterol medication at this time. Follow lipid panel.  The 10-year ASCVD risk score (Arnett DK, et al., 2019) is: 10%   Values used to calculate the score:     Age: 57 years     Sex: Female     Is Non-Hispanic African American: Yes     Diabetic: No     Tobacco smoker: No     Systolic Blood Pressure: 128 mmHg     Is BP treated: Yes     HDL Cholesterol: 77.9 mg/dL     Total Cholesterol: 220 mg/dL   Orders: -     Basic metabolic panel with GFR; Future -     Hepatic function panel; Future -     Lipid panel; Future  Degenerative disc disease, cervical Assessment & Plan: Has been a persistent intermittent issue.  Some recent intermittent neck discomfort. Discussed PT referral. Wants to do home exercise. Follow. Will notify me if desires referral.    Fatty liver Assessment & Plan: Previous ultrasound revealed fatty liver.  Follow liver function tests.  Recent check 08/31/23 - wnl.    History of colonic polyps Assessment & Plan: Colonoscopy 06/2018.   Orders: -     Ambulatory referral to Gastroenterology  Hypertension, essential Assessment & Plan: Blood pressures as outlined. Now on micardis  40mg   q day and amlodipine  2.5mg  q day. Hold on making any changes in medication. Follow pressures.  Follow metabolic panel.    Stress Assessment & Plan: Appears to be doing well. Follow     Other orders -     Metoprolol  Succinate ER; Take 0.5 tablets (12.5 mg total) by mouth daily.  Dispense: 45 tablet; Refill: 3     Dellar Fenton, MD

## 2023-09-04 ENCOUNTER — Encounter: Payer: Self-pay | Admitting: Internal Medicine

## 2023-09-04 NOTE — Assessment & Plan Note (Signed)
 Blood pressures as outlined. Now on micardis  40mg  q day and amlodipine  2.5mg  q day. Hold on making any changes in medication. Follow pressures.  Follow metabolic panel.

## 2023-09-04 NOTE — Assessment & Plan Note (Signed)
 Has been a persistent intermittent issue.  Some recent intermittent neck discomfort. Discussed PT referral. Wants to do home exercise. Follow. Will notify me if desires referral.

## 2023-09-04 NOTE — Assessment & Plan Note (Signed)
 Previous ultrasound revealed fatty liver.  Follow liver function tests.  Recent check 08/31/23 - wnl.

## 2023-09-04 NOTE — Assessment & Plan Note (Signed)
Colonoscopy 06/2018.

## 2023-09-04 NOTE — Assessment & Plan Note (Signed)
Appears to be doing well.  Follow.   

## 2023-09-12 ENCOUNTER — Ambulatory Visit: Payer: Self-pay

## 2023-09-12 NOTE — Telephone Encounter (Signed)
 Called pt and she has not had any spells since Saturday. She states that she is doing good just not much energy. She stated that she was in a long line where it was hot, did not eat, and did not have water to drink. Her Bp was 130/82 standing 127/83. Patient stated that she has not been sick. She has been drinking water. She has not had any GI symptoms. She stated that when she got inside in the cool and drank some water she felt much better.

## 2023-09-12 NOTE — Telephone Encounter (Signed)
  FYI Only or Action Required?: FYI only for provider  Patient was last seen in primary care on 09/02/2023 by Dellar Fenton, MD. Called Nurse Triage reporting Dizziness. Symptoms began several days ago. Interventions attempted: Nothing. Symptoms are: stable.  Triage Disposition: See Physician Within 24 Hours  Patient/caregiver understands and will follow disposition?: Yes Copied from CRM [id number]. Topic: Clinical - Red Word Triage >> Sep 12, 2023  8:10 AM [place] E wrote: [facility] that prompted transfer to Nurse Triage: Patient was at [facility] on Saturday and felt dizzy, hot, flushed, and overheated. EMS came but did not transport to hospital. Patient was standing in line for 2 hours without eating. Reason for Disposition  Nursing judgment or information in reference  Answer Assessment - Initial Assessment Questions 1. REASON FOR CALL: "What is your main concern right now?"     Had dizziness on Saturday at the [facility]; wanted f/u apt. Declined triage.  States no dizziness since Saturday; EMS came and gave her water, no falls, states felt better after having water.  Protocols used: No Guideline Available-A-AH

## 2023-09-12 NOTE — Patient Instructions (Incomplete)
 It was a pleasure meeting you today. Thank you for allowing me to take part in your health care.  Our goals for today as we discussed include:  Recent blood work normal  Follow up with PCP if symptoms recur  Please ensure adequate hydration daily   This is a list of the screening recommended for you and due dates:  Health Maintenance  Topic Date Due   Hepatitis C Screening  Never done   Pneumonia Vaccine (1 of 1 - PCV) Never done   Zoster (Shingles) Vaccine (1 of 2) Never done   COVID-19 Vaccine (8 - 2024-25 season) 09/18/2023*   Colon Cancer Screening  01/28/2024*   Flu Shot  11/04/2023   Mammogram  06/30/2024   DEXA scan (bone density measurement)  Completed   HPV Vaccine  Aged Out   Meningitis B Vaccine  Aged Out   DTaP/Tdap/Td vaccine  Discontinued  *Topic was postponed. The date shown is not the original due date.      If you have any questions or concerns, please do not hesitate to call the office at [phone].  I look forward to our next visit and until then take care and stay safe.  Regards,   Valli Gaw, MD   [facility]

## 2023-09-12 NOTE — Telephone Encounter (Signed)
 Please call pt. Need more information. It appears her dizziness was on Saturday. Per note, no symptoms since Saturday. Please call and confirm she is doing ok. Any other symptoms? Confirm blood pressure ok. What was blood pressure and blood sugar when EMS evaluated? Has she been sick - eating, drinking?  Any GI symptoms?

## 2023-09-13 ENCOUNTER — Encounter: Payer: Self-pay | Admitting: Family Medicine

## 2023-09-13 ENCOUNTER — Ambulatory Visit: Admitting: Family Medicine

## 2023-09-13 VITALS — BP 140/86 | HR 59 | Temp 98.4°F | Resp 20 | Ht 62.0 in | Wt 111.0 lb

## 2023-09-13 DIAGNOSIS — R55 Syncope and collapse: Secondary | ICD-10-CM | POA: Insufficient documentation

## 2023-09-13 DIAGNOSIS — Z1159 Encounter for screening for other viral diseases: Secondary | ICD-10-CM | POA: Diagnosis not present

## 2023-09-13 DIAGNOSIS — I10 Essential (primary) hypertension: Secondary | ICD-10-CM | POA: Diagnosis not present

## 2023-09-13 NOTE — Assessment & Plan Note (Signed)
 BP elevated with orthostatics.  Decreased prior to discharge.  Reports home BP 130's.   Adheres to medication but lacks blood pressure logs. Currently takes Metoprolol , Norvasc  and Telmisartan  - Continue Telmisartan  40 mg daily - Continue Amlodipine  2.5 mg daily - Continue Metoprolol  XL 12.5 mg daily - Repeat blood pressure measurement to confirm current levels. - Follow up with PCP sooner if hypertension does not improve.

## 2023-09-13 NOTE — Assessment & Plan Note (Addendum)
 Episode after prolonged standing in heat with flushing, dizziness, and near syncope. Resolved with rest, cooling, and hydration. Likely due to dehydration and heat exposure. Episode occurred 3 days ago and was evaluated by EMS.  Patient declined transport to medical facility at that time.  Has not had any further episodes. - EKG: unchanged from previous tracings. - Orthostatic vitals negative - Check Cmet, CBC today  - Increase fluid intake - Monitor for recurrence of symptoms. - Follow up with PCP - Strict return precautions provided

## 2023-09-13 NOTE — Progress Notes (Signed)
 SUBJECTIVE:   Chief Complaint  Patient presents with   Dizziness    On Saturday   HPI Presents to clinic for acute visit  Discussed the use of AI scribe software for clinical note transcription with the patient, who gave verbal consent to proceed.  History of Present Illness Angel Taylor is a 68 year old female with hypertension who presents with a near syncopal episode.  Three days ago, she experienced a near syncopal episode while standing in line outside the [facility] for over two hours in the heat. She felt flushed, dizzy, and nearly lost her footing, requiring assistance to sit down. She did not lose consciousness but has a vague memory of the event. After sitting down, cooling off, and drinking water, she felt much better. EMS was called, and her blood pressure and vitals were checked and found to be okay. She did not go to the hospital as she felt improved after resting.  Since the episode, she has not experienced further symptoms but notes a lack of energy. She has a history of hypertension and recently had a change in her blood pressure medication regimen two weeks ago. Previously, she was taking half a tablet of metoprolol  in the morning and 80 mg of telmisartan  at night. Her regimen was changed to half a tablet of metoprolol  and 40 mg of amlodipine  in the morning, and 2.5 mg of amlodipine  at night. She reports her blood pressure at home has been under 130 since the change, though she has not been logging it.  No heart or lung problems but she mentions having GERD. She recalls a previous episode of high blood pressure that led to a hospital visit and stress tests, but she has not followed up with cardiology recently. No nausea, vomiting, chest pain, shortness of breath, or recent illness exposure. She reports a sensation of fullness, which she associates with GERD. She has been trying to increase her fluid intake, including drinking Pedialyte and Gatorade, suspecting dehydration might  have contributed to her symptoms.     PERTINENT PMH / PSH: As above  OBJECTIVE:  BP (!) 140/86   Pulse (!) 59   Temp 98.4 F (36.9 C)   Resp 20   Ht 5\' 2"  (1.575 m)   Wt 111 lb (50.3 kg)   SpO2 99%   BMI 20.30 kg/m    Orthostatic Vitals for the past 48 hrs (Last 6 readings):  Orthostatic BP Orthostatic Pulse BP Pulse  09/13/23 0804 -- -- (!) 154/82 (!) 59  09/13/23 0810 173/83 63 -- --  09/13/23 0811 (!) 185/102 71 -- --  09/13/23 0812 (!) 180/112 81 -- --  09/13/23 0858 -- -- (!) 140/86 --     Physical Exam Vitals reviewed.  Constitutional:      General: She is not in acute distress.    Appearance: Normal appearance. She is normal weight. She is not ill-appearing, toxic-appearing or diaphoretic.  Eyes:     General:        Right eye: No discharge.        Left eye: No discharge.     Conjunctiva/sclera: Conjunctivae normal.  Cardiovascular:     Rate and Rhythm: Normal rate and regular rhythm.     Heart sounds: Normal heart sounds.  Pulmonary:     Effort: Pulmonary effort is normal.     Breath sounds: Normal breath sounds.  Abdominal:     General: Bowel sounds are normal.  Musculoskeletal:  General: Normal range of motion.     Right lower leg: No edema.     Left lower leg: No edema.  Skin:    General: Skin is warm and dry.  Neurological:     General: No focal deficit present.     Mental Status: She is alert and oriented to person, place, and time. Mental status is at baseline.  Psychiatric:        Mood and Affect: Mood normal.        Behavior: Behavior normal.        Thought Content: Thought content normal.        Judgment: Judgment normal.           09/13/2023    9:11 AM 09/02/2023    9:03 AM 04/28/2023   11:45 AM 04/19/2022    8:40 AM 12/14/2021    2:30 PM  Depression screen PHQ 2/9  Decreased Interest 0 0 0 0 0  Down, Depressed, Hopeless 0 0 0 0 0  PHQ - 2 Score 0 0 0 0 0  Altered sleeping 0  1 2   Tired, decreased energy 0  0 0   Change  in appetite 0  0 1   Feeling bad or failure about yourself  0  0 0   Trouble concentrating 0  0 0   Moving slowly or fidgety/restless 0  0 0   Suicidal thoughts 0  0 0   PHQ-9 Score 0  1 3   Difficult doing work/chores Not difficult at all  Not difficult at all Not difficult at all       09/13/2023    9:12 AM 04/28/2023   11:45 AM 04/19/2022    8:40 AM  GAD 7 : Generalized Anxiety Score  Nervous, Anxious, on Edge 0 0 0  Control/stop worrying 0 0 0  Worry too much - different things 0 0 0  Trouble relaxing 0 0 1  Restless 0 0 0  Easily annoyed or irritable 0 0 0  Afraid - awful might happen 0 0 0  Total GAD 7 Score 0 0 1  Anxiety Difficulty Not difficult at all  Not difficult at all    ASSESSMENT/PLAN:  Vasovagal episode Assessment & Plan: Episode after prolonged standing in heat with flushing, dizziness, and near syncope. Resolved with rest, cooling, and hydration. Likely due to dehydration and heat exposure. Episode occurred 3 days ago and was evaluated by EMS.  Patient declined transport to medical facility at that time.  Has not had any further episodes. - EKG: unchanged from previous tracings. - Orthostatic vitals negative - Check Cmet, CBC today  - Increase fluid intake - Monitor for recurrence of symptoms. - Follow up with PCP - Strict return precautions provided  Orders: -     EKG 12-Lead -     Comprehensive metabolic panel with GFR -     CBC with Differential/Platelet -     EKG 12-Lead  Need for hepatitis C screening test -     Hepatitis C antibody  Hypertension, essential Assessment & Plan: BP elevated with orthostatics.  Decreased prior to discharge.  Reports home BP 130's.   Adheres to medication but lacks blood pressure logs. Currently takes Metoprolol , Norvasc  and Telmisartan  - Continue Telmisartan  40 mg daily - Continue Amlodipine  2.5 mg daily - Continue Metoprolol  XL 12.5 mg daily - Repeat blood pressure measurement to confirm current levels. - Follow  up with PCP sooner if hypertension does not improve.  PDMP reviewed  Return in about 2 weeks (around 09/27/2023) for PCP, HTN.  Valli Gaw, MD

## 2023-09-14 ENCOUNTER — Ambulatory Visit: Payer: Self-pay | Admitting: Family Medicine

## 2023-09-14 ENCOUNTER — Encounter: Payer: Self-pay | Admitting: Internal Medicine

## 2023-09-14 LAB — CBC WITH DIFFERENTIAL/PLATELET
Basophils Absolute: 0 10*3/uL (ref 0.0–0.2)
Basos: 1 %
EOS (ABSOLUTE): 0.1 10*3/uL (ref 0.0–0.4)
Eos: 2 %
Hematocrit: 41.1 % (ref 34.0–46.6)
Hemoglobin: 13.3 g/dL (ref 11.1–15.9)
Immature Grans (Abs): 0 10*3/uL (ref 0.0–0.1)
Immature Granulocytes: 0 %
Lymphocytes Absolute: 1.9 10*3/uL (ref 0.7–3.1)
Lymphs: 40 %
MCH: 31.3 pg (ref 26.6–33.0)
MCHC: 32.4 g/dL (ref 31.5–35.7)
MCV: 97 fL (ref 79–97)
Monocytes Absolute: 0.4 10*3/uL (ref 0.1–0.9)
Monocytes: 8 %
Neutrophils Absolute: 2.3 10*3/uL (ref 1.4–7.0)
Neutrophils: 49 %
Platelets: 283 10*3/uL (ref 150–450)
RBC: 4.25 x10E6/uL (ref 3.77–5.28)
RDW: 12 % (ref 11.7–15.4)
WBC: 4.7 10*3/uL (ref 3.4–10.8)

## 2023-09-14 LAB — COMPREHENSIVE METABOLIC PANEL WITH GFR
ALT: 16 IU/L (ref 0–32)
AST: 22 IU/L (ref 0–40)
Albumin: 4.3 g/dL (ref 3.9–4.9)
Alkaline Phosphatase: 68 IU/L (ref 44–121)
BUN/Creatinine Ratio: 12 (ref 12–28)
BUN: 11 mg/dL (ref 8–27)
Bilirubin Total: 0.6 mg/dL (ref 0.0–1.2)
CO2: 22 mmol/L (ref 20–29)
Calcium: 10 mg/dL (ref 8.7–10.3)
Chloride: 103 mmol/L (ref 96–106)
Creatinine, Ser: 0.93 mg/dL (ref 0.57–1.00)
Globulin, Total: 2.6 g/dL (ref 1.5–4.5)
Glucose: 110 mg/dL — ABNORMAL HIGH (ref 70–99)
Potassium: 4.6 mmol/L (ref 3.5–5.2)
Sodium: 142 mmol/L (ref 134–144)
Total Protein: 6.9 g/dL (ref 6.0–8.5)
eGFR: 68 mL/min/{1.73_m2} (ref >59)

## 2023-09-14 LAB — HEPATITIS C ANTIBODY: Hep C Virus Ab: NONREACTIVE

## 2023-09-15 NOTE — Telephone Encounter (Signed)
 Please call and confirm she is eating and drinking normally. Confirm no other acute symptoms. If persistent problems and persistent increased in blood pressure, needs to be reevaluated.

## 2023-09-15 NOTE — Telephone Encounter (Signed)
 LM for patient

## 2023-09-16 NOTE — Telephone Encounter (Signed)
 Spoke with patient and made her aware of Dr Thayne Fine recommendations. Patient verbalized understanding and has no further questions at this time.

## 2023-09-16 NOTE — Telephone Encounter (Signed)
 Reviewed. It appears she is feeling better overall, but still having some issues with standing. If any acute change or problems, needs to be reevaluated. Call with update beginning of week.

## 2023-09-20 ENCOUNTER — Ambulatory Visit: Payer: Self-pay

## 2023-09-20 DIAGNOSIS — M542 Cervicalgia: Secondary | ICD-10-CM

## 2023-09-20 NOTE — Telephone Encounter (Signed)
 FYI Only or Action Required?: Action required by provider  Patient was last seen in primary care on 09/13/2023 by Valli Gaw, MD. Called Nurse Triage reporting updates. Symptoms began today. Interventions attempted: Nothing. Symptoms are: stable.  Triage Disposition: Home Care  Patient/caregiver understands and will follow disposition?: Yes   Copied from CRM [id number]. Topic: Clinical - Red Word Triage >> Sep 12, 2023  8:10 AM [place] E wrote: [facility] that prompted transfer to Nurse Triage: Patient was at [facility] on Saturday and felt dizzy, hot, flushed, and overheated. EMS came but did not transport to hospital. Patient was standing in line for 2 hours without eating. >> Sep 20, 2023  1:07 PM Magdalene [employer] wrote: Patient calling to update doctor on her symptoms.  Patient states she is doing pretty good, states bp high 149/92 yesterday and today 137/73.  States feeling better overall, states has a tension headache today but has been drinking more water; headache comes and goes. Denies dizziness. Reason for Disposition  Nursing judgment or information in reference  Answer Assessment - Initial Assessment Questions 1. REASON FOR CALL: What is your main concern right now?     Wanted to update PCP on how she is feeling. States would like to do physical therapy.  Protocols used: No Guideline Available-A-AH

## 2023-09-20 NOTE — Telephone Encounter (Signed)
 Yes, please schedule an earlier appt.  Also, the original messaage stated she is wanting PT.  Please clarify - therapy for?  And where she prefers?

## 2023-09-20 NOTE — Telephone Encounter (Signed)
 Update from patient:   Patient states she is doing pretty good, states bp high 149/92 yesterday and today 137/73. States feeling better overall, states has a tension headache today but has been drinking more water; headache comes and goes. Denies dizziness.   Do you want me to schedule her for a soon follow up with you to f/u on her BP? She is not scheduled to see you until September

## 2023-09-21 NOTE — Telephone Encounter (Signed)
 LMTCB

## 2023-09-22 NOTE — Telephone Encounter (Signed)
 LMTCB

## 2023-09-23 NOTE — Telephone Encounter (Signed)
Order placed for PT referral.  

## 2023-09-23 NOTE — Addendum Note (Signed)
 Addended by: Raejean Bullock on: 09/23/2023 10:01 AM   Modules accepted: Orders

## 2023-09-23 NOTE — Telephone Encounter (Signed)
 Called Patient and made an appointment for 11/28/23 for BP follow up. Patient states the last few days her BP has been under 130. Patient cancelled the 9/25 appointment. Patient states forget the PT due to Dr. Geralyn Knee not remembering that it is for her neck having the degenerative changes and offering her PT for it when she was here.

## 2023-09-23 NOTE — Telephone Encounter (Signed)
 I called Ms Angel Taylor. Explained to her I was not in the office today. We discussed that I was aware of her neck issues and that we had discussed PT. I wanted to clarify nothing more going on and where she wanted to go for therapy. She does prefer [facility]. Blood pressure much better. She did report she raised up and bumper her head on her car. Some pain where she hit her head, but is better today. No dizziness or light headedness. Discussed evaluation. States if anything changes or if any new symptoms, she will be evaluated.

## 2023-10-13 ENCOUNTER — Encounter: Payer: Self-pay | Admitting: Internal Medicine

## 2023-10-13 DIAGNOSIS — Z1211 Encounter for screening for malignant neoplasm of colon: Secondary | ICD-10-CM

## 2023-10-14 NOTE — Telephone Encounter (Signed)
 Please call and see what she needs. If she has a preference of where she wants to go, I an place a referral.

## 2023-10-14 NOTE — Telephone Encounter (Signed)
 LMTCB

## 2023-10-21 ENCOUNTER — Other Ambulatory Visit: Payer: Self-pay | Admitting: Internal Medicine

## 2023-10-21 NOTE — Telephone Encounter (Signed)
 Called patient. She wants to have her colonoscopy done at [facility] because it is cheaper. Advised that she would have to ask [facility] if they do their procedures there. Also advised that we cannot refer to a specific location for her to have procedure done, we have to refer to a specialist and they determine where procedure is done. Pt stated she would like to go to Dr Donnice Manes at [facility] because the website says he does procedures there. Pt stated she doesn't understand why this is so complicated. New referral placed.

## 2023-10-21 NOTE — Telephone Encounter (Signed)
 Copied from CRM [id number]. Topic: Referral - Question >> Oct 21, 2023  2:29 PM [place] wrote: Reason for CRM: Patient asking for Angel Sueanne BIRCH, LPN to contact her on her cellphone [phone] to discuss the appointment for a colonoscopy

## 2023-10-28 ENCOUNTER — Other Ambulatory Visit: Payer: Self-pay | Admitting: Internal Medicine

## 2023-10-28 MED ORDER — TELMISARTAN 40 MG PO TABS: 40.0000 mg | ORAL_TABLET | Freq: Every day | ORAL | 0 refills | Status: DC

## 2023-10-28 NOTE — Telephone Encounter (Signed)
 Copied from CRM [id number]. Topic: Clinical - Medication Refill >> Oct 28, 2023 10:44 AM Lavanda D wrote: Medication: telmisartan  (MICARDIS ) 40 MG tablet [Pharmacy Med Name: TELMISARTAN  40 MG TABLET] [[id number]]  Has the patient contacted their pharmacy? Yes, they have sent a request (Agent: If no, request that the patient contact the pharmacy for the refill. If patient does not wish to contact the pharmacy document the reason why and proceed with request.) (Agent: If yes, when and what did the pharmacy advise?)  This is the patient's preferred pharmacy:  [id number] [place], [place] - [address] [place] [address] Phone: [phone] Fax: [phone]  Is this the correct pharmacy for this prescription? Yes If no, delete pharmacy and type the correct one.   Has the prescription been filled recently? No  Is the patient out of the medication? Yes  Has the patient been seen for an appointment in the last year OR does the patient have an upcoming appointment? Yes  Can we respond through MyChart? Yes  Agent: Please be advised that Rx refills may take up to 3 business days. We ask that you follow-up with your pharmacy.

## 2023-11-11 DIAGNOSIS — H2513 Age-related nuclear cataract, bilateral: Secondary | ICD-10-CM | POA: Diagnosis not present

## 2023-11-11 DIAGNOSIS — H401132 Primary open-angle glaucoma, bilateral, moderate stage: Secondary | ICD-10-CM | POA: Diagnosis not present

## 2023-11-11 DIAGNOSIS — H5310 Unspecified subjective visual disturbances: Secondary | ICD-10-CM | POA: Diagnosis not present

## 2023-11-16 ENCOUNTER — Other Ambulatory Visit: Payer: Self-pay | Admitting: Internal Medicine

## 2023-11-28 ENCOUNTER — Ambulatory Visit: Admitting: Internal Medicine

## 2023-12-13 DIAGNOSIS — H2513 Age-related nuclear cataract, bilateral: Secondary | ICD-10-CM | POA: Diagnosis not present

## 2023-12-13 DIAGNOSIS — H401132 Primary open-angle glaucoma, bilateral, moderate stage: Secondary | ICD-10-CM | POA: Diagnosis not present

## 2023-12-27 DIAGNOSIS — H401111 Primary open-angle glaucoma, right eye, mild stage: Secondary | ICD-10-CM | POA: Diagnosis not present

## 2023-12-27 DIAGNOSIS — H401122 Primary open-angle glaucoma, left eye, moderate stage: Secondary | ICD-10-CM | POA: Diagnosis not present

## 2023-12-27 DIAGNOSIS — H25812 Combined forms of age-related cataract, left eye: Secondary | ICD-10-CM | POA: Diagnosis not present

## 2023-12-27 DIAGNOSIS — H25811 Combined forms of age-related cataract, right eye: Secondary | ICD-10-CM | POA: Diagnosis not present

## 2023-12-29 ENCOUNTER — Other Ambulatory Visit

## 2024-01-03 ENCOUNTER — Ambulatory Visit: Admitting: Internal Medicine

## 2024-01-05 ENCOUNTER — Other Ambulatory Visit (INDEPENDENT_AMBULATORY_CARE_PROVIDER_SITE_OTHER)

## 2024-01-05 ENCOUNTER — Other Ambulatory Visit

## 2024-01-05 DIAGNOSIS — E7800 Pure hypercholesterolemia, unspecified: Secondary | ICD-10-CM | POA: Diagnosis not present

## 2024-01-05 LAB — BASIC METABOLIC PANEL WITH GFR
BUN: 16 mg/dL (ref 6–23)
CO2: 28 meq/L (ref 19–32)
Calcium: 9.9 mg/dL (ref 8.4–10.5)
Chloride: 103 meq/L (ref 96–112)
Creatinine, Ser: 0.87 mg/dL (ref 0.40–1.20)
GFR: 69.23 mL/min (ref >60.00)
Glucose, Bld: 97 mg/dL (ref 70–99)
Potassium: 5 meq/L (ref 3.5–5.1)
Sodium: 140 meq/L (ref 135–145)

## 2024-01-05 LAB — HEPATIC FUNCTION PANEL
ALT: 13 U/L (ref 0–35)
AST: 18 U/L (ref 0–37)
Albumin: 4.3 g/dL (ref 3.5–5.2)
Alkaline Phosphatase: 55 U/L (ref 39–117)
Bilirubin, Direct: 0.1 mg/dL (ref 0.0–0.3)
Total Bilirubin: 0.7 mg/dL (ref 0.2–1.2)
Total Protein: 7.2 g/dL (ref 6.0–8.3)

## 2024-01-05 LAB — LIPID PANEL
Cholesterol: 223 mg/dL — ABNORMAL HIGH (ref 0–200)
HDL: 71.4 mg/dL (ref >39.00)
LDL Cholesterol: 138 mg/dL — ABNORMAL HIGH (ref 0–99)
NonHDL: 151.4
Total CHOL/HDL Ratio: 3
Triglycerides: 66 mg/dL (ref 0.0–149.0)
VLDL: 13.2 mg/dL (ref 0.0–40.0)

## 2024-01-06 ENCOUNTER — Ambulatory Visit: Payer: Self-pay | Admitting: Internal Medicine

## 2024-01-09 DIAGNOSIS — I10 Essential (primary) hypertension: Secondary | ICD-10-CM | POA: Diagnosis not present

## 2024-01-09 DIAGNOSIS — H401122 Primary open-angle glaucoma, left eye, moderate stage: Secondary | ICD-10-CM | POA: Diagnosis not present

## 2024-01-09 DIAGNOSIS — H25812 Combined forms of age-related cataract, left eye: Secondary | ICD-10-CM | POA: Diagnosis not present

## 2024-01-12 ENCOUNTER — Ambulatory Visit: Admitting: Internal Medicine

## 2024-01-12 VITALS — BP 118/70 | HR 62 | Resp 16 | Ht 62.0 in | Wt 110.4 lb

## 2024-01-12 DIAGNOSIS — E7800 Pure hypercholesterolemia, unspecified: Secondary | ICD-10-CM | POA: Diagnosis not present

## 2024-01-12 DIAGNOSIS — Z860100 Personal history of colon polyps, unspecified: Secondary | ICD-10-CM | POA: Diagnosis not present

## 2024-01-12 DIAGNOSIS — I10 Essential (primary) hypertension: Secondary | ICD-10-CM | POA: Diagnosis not present

## 2024-01-12 DIAGNOSIS — K760 Fatty (change of) liver, not elsewhere classified: Secondary | ICD-10-CM

## 2024-01-12 DIAGNOSIS — F439 Reaction to severe stress, unspecified: Secondary | ICD-10-CM | POA: Diagnosis not present

## 2024-01-12 NOTE — Progress Notes (Signed)
 Subjective:    Patient ID: Angel Taylor, female    DOB: [birth year], 68 y.o.   MRN: [id number]  Patient here for  Chief Complaint  Patient presents with   Medical Management of Chronic Issues    HPI Here for a scheduled follow up - follow up regarding hypertension and hypercholesterolemia. Continues on micardis , amlodipine  and metoprolol . Reviewed outside blood pressures - blood pressures averaging 118-132/70s. No chest pain or sob reported. No cough or congestion. S/p eye surgery - cataract/glaucoma. Has f/u 01/18/24. Planning for right eye surgery 01/23/24.    Past Medical History:  Diagnosis Date   Anemia    Chronic headaches    DDD (degenerative disc disease), cervical    Fatty liver    Frequent UTI    GERD (gastroesophageal reflux disease)    Glaucoma (increased eye pressure)    Hypercholesterolemia    Pre-eclampsia    Submandibular gland mass    right   Past Surgical History:  Procedure Laterality Date   ABDOMINAL HYSTERECTOMY     total   CESAREAN SECTION  04/05/1985   COLONOSCOPY     MASS EXCISION Right 02/17/2022   Procedure: EXCISION SUBMANDIBULAR MASS;  Surgeon: Blair Mt, MD;  Location: [facility];  Service: ENT;  Laterality: Right;   TUBAL LIGATION  04/05/1985   Family History  Problem Relation Age of Onset   Diabetes Mother    Alzheimer's disease Mother    Seizures Mother    Heart disease Father    Breast cancer Neg Hx    Social History   Socioeconomic History   Marital status: Married    Spouse name: Not on file   Number of children: 2   Years of education: Not on file   Highest education level: Bachelor's degree (e.g., BA, AB, BS)  Occupational History   Not on file  Tobacco Use   Smoking status: Never   Smokeless tobacco: Never  Vaping Use   Vaping status: Never Used  Substance and Sexual Activity   Alcohol use: No    Alcohol/week: 0.0 standard drinks of alcohol   Drug use: No   Sexual activity: Not on file  Other Topics Concern    Not on file  Social History Narrative   Not on file   Social Drivers of Health   Financial Resource Strain: Low Risk  (01/08/2024)   Overall Financial Resource Strain (CARDIA)    Difficulty of Paying Living Expenses: Not hard at all  Food Insecurity: No Food Insecurity (01/08/2024)   Hunger Vital Sign    Worried About Running Out of Food in the Last Year: Never true    Ran Out of Food in the Last Year: Never true  Transportation Needs: No Transportation Needs (01/08/2024)   PRAPARE - Administrator, Civil Service (Medical): No    Lack of Transportation (Non-Medical): No  Physical Activity: Insufficiently Active (01/08/2024)   Exercise Vital Sign    Days of Exercise per Week: 2 days    Minutes of Exercise per Session: 20 min  Stress: No Stress Concern Present (01/08/2024)   [employer] of Occupational Health - Occupational Stress Questionnaire    Feeling of Stress: Only a little  Social Connections: Socially Integrated (01/08/2024)   Social Connection and Isolation Panel    Frequency of Communication with Friends and Family: Twice a week    Frequency of Social Gatherings with Friends and Family: Twice a week    Attends Religious Services: More than 4 times  per year    Active Member of Clubs or Organizations: Yes    Attends Banker Meetings: More than 4 times per year    Marital Status: Married     Review of Systems  Constitutional:  Negative for appetite change and unexpected weight change.  HENT:  Negative for congestion and sinus pressure.   Respiratory:  Negative for cough, chest tightness and shortness of breath.   Cardiovascular:  Negative for chest pain, palpitations and leg swelling.  Gastrointestinal:  Negative for abdominal pain, diarrhea, nausea and vomiting.  Genitourinary:  Negative for difficulty urinating and dysuria.  Musculoskeletal:  Negative for joint swelling and myalgias.  Skin:  Negative for color change and rash.  Neurological:   Negative for dizziness and headaches.  Psychiatric/Behavioral:  Negative for agitation and dysphoric mood.        Objective:     BP 118/70   Pulse 62   Resp 16   Ht 5' 2 (1.575 m)   Wt 110 lb 6.4 oz (50.1 kg)   SpO2 98%   BMI 20.19 kg/m  Wt Readings from Last 3 Encounters:  01/12/24 110 lb 6.4 oz (50.1 kg)  09/13/23 111 lb (50.3 kg)  09/02/23 110 lb 12.8 oz (50.3 kg)    Physical Exam Vitals reviewed.  Constitutional:      General: She is not in acute distress.    Appearance: Normal appearance.  HENT:     Head: Normocephalic and atraumatic.     Right Ear: External ear normal.     Left Ear: External ear normal.     Mouth/Throat:     Pharynx: No oropharyngeal exudate or posterior oropharyngeal erythema.  Eyes:     General: No scleral icterus.       Right eye: No discharge.        Left eye: No discharge.     Conjunctiva/sclera: Conjunctivae normal.  Neck:     Thyroid : No thyromegaly.  Cardiovascular:     Rate and Rhythm: Normal rate and regular rhythm.  Pulmonary:     Effort: No respiratory distress.     Breath sounds: Normal breath sounds. No wheezing.  Abdominal:     General: Bowel sounds are normal.     Palpations: Abdomen is soft.     Tenderness: There is no abdominal tenderness.  Musculoskeletal:        General: No swelling or tenderness.     Cervical back: Neck supple. No tenderness.  Lymphadenopathy:     Cervical: No cervical adenopathy.  Skin:    Findings: No erythema or rash.  Neurological:     Mental Status: She is alert.  Psychiatric:        Mood and Affect: Mood normal.        Behavior: Behavior normal.         Outpatient Encounter Medications as of 01/12/2024  Medication Sig   amLODipine  (NORVASC ) 2.5 MG tablet TAKE 1 TABLET BY MOUTH DAILY   Calcium-Vitamin D-Vitamin K (VIACTIV CALCIUM PLUS D) 650-12.5-40 MG-MCG-MCG CHEW Chew 2 capsules by mouth daily.   diphenhydrAMINE (BENADRYL) 25 MG tablet Take 25 mg by mouth every 6 (six) hours as  needed for allergies.   famotidine  (PEPCID ) 20 MG tablet TAKE 1 TABLET BY MOUTH DAILY   latanoprost  (XALATAN ) 0.005 % ophthalmic solution Place 1 drop into both eyes at bedtime.   metoprolol  succinate (TOPROL -XL) 25 MG 24 hr tablet Take 0.5 tablets (12.5 mg total) by mouth daily.   multivitamin-iron-minerals-folic acid (CENTRUM)  chewable tablet Chew 1 tablet by mouth daily.   telmisartan  (MICARDIS ) 40 MG tablet TAKE 1 TABLET BY MOUTH DAILY   timolol  (TIMOPTIC ) 0.25 % ophthalmic solution Place 1 drop into both eyes every morning.   No facility-administered encounter medications on file as of 01/12/2024.     Lab Results  Component Value Date   WBC 4.7 09/13/2023   HGB 13.3 09/13/2023   HCT 41.1 09/13/2023   PLT 283 09/13/2023   GLUCOSE 97 01/05/2024   CHOL 223 (H) 01/05/2024   TRIG 66.0 01/05/2024   HDL 71.40 01/05/2024   LDLDIRECT 119.7 05/30/2012   LDLCALC 138 (H) 01/05/2024   ALT 13 01/05/2024   AST 18 01/05/2024   NA 140 01/05/2024   K 5.0 01/05/2024   CL 103 01/05/2024   CREATININE 0.87 01/05/2024   BUN 16 01/05/2024   CO2 28 01/05/2024   TSH 2.09 08/31/2023    MM 3D SCREEN BREAST BILATERAL Result Date: 07/05/2023 CLINICAL DATA:  Screening. EXAM: DIGITAL SCREENING BILATERAL MAMMOGRAM WITH TOMOSYNTHESIS AND CAD TECHNIQUE: Bilateral screening digital craniocaudal and mediolateral oblique mammograms were obtained. Bilateral screening digital breast tomosynthesis was performed. The images were evaluated with computer-aided detection. COMPARISON:  Previous exam(s). ACR Breast Density Category b: There are scattered areas of fibroglandular density. FINDINGS: There are no findings suspicious for malignancy. IMPRESSION: No mammographic evidence of malignancy. A result letter of this screening mammogram will be mailed directly to the patient. RECOMMENDATION: Screening mammogram in one year. (Code:SM-B-01Y) BI-RADS CATEGORY  1: Negative. Electronically Signed   By: Inocente Ast M.D.    On: 07/05/2023 09:47       Assessment & Plan:  Hypertension, essential Assessment & Plan: Blood pressures as outlined. Now on micardis  40mg  q day and amlodipine  2.5mg  q day. Hold on making any changes in medication. Follow pressures.  Follow metabolic panel.    Hypercholesterolemia Assessment & Plan: Low cholesterol diet and exercise. Discussed calculated cholesterol risk. Desires not to start cholesterol medication at this time. Follow lipid panel.  The 10-year ASCVD risk score (Arnett DK, et al., 2019) is: 10.6%   Values used to calculate the score:     Age: 51 years     Clincally relevant sex: Female     Is Non-Hispanic African American: Yes     Diabetic: No     Tobacco smoker: No     Systolic Blood Pressure: 130 mmHg     Is BP treated: Yes     HDL Cholesterol: 71.4 mg/dL     Total Cholesterol: 223 mg/dL    Stress Assessment & Plan: Overall appears to be doing well. Follow.    History of colonic polyps Assessment & Plan: Colonoscopy 06/2018.    Fatty liver Assessment & Plan: Previous ultrasound revealed fatty liver.  Follow liver function tests.  Recent check 01/2024  - wnl.       Allena Hamilton, MD

## 2024-01-12 NOTE — Assessment & Plan Note (Signed)
 Low cholesterol diet and exercise. Discussed calculated cholesterol risk. Desires not to start cholesterol medication at this time. Follow lipid panel.  The 10-year ASCVD risk score (Arnett DK, et al., 2019) is: 10.6%   Values used to calculate the score:     Age: 68 years     Clincally relevant sex: Female     Is Non-Hispanic African American: Yes     Diabetic: No     Tobacco smoker: No     Systolic Blood Pressure: 130 mmHg     Is BP treated: Yes     HDL Cholesterol: 71.4 mg/dL     Total Cholesterol: 223 mg/dL

## 2024-01-12 NOTE — Assessment & Plan Note (Signed)
 Blood pressures as outlined. Now on micardis  40mg  q day and amlodipine  2.5mg  q day. Hold on making any changes in medication. Follow pressures.  Follow metabolic panel.

## 2024-01-12 NOTE — Patient Instructions (Signed)
Calcium score - (CT scan)

## 2024-01-15 ENCOUNTER — Encounter: Payer: Self-pay | Admitting: Internal Medicine

## 2024-01-15 NOTE — Assessment & Plan Note (Addendum)
 Previous ultrasound revealed fatty liver.  Follow liver function tests.  Recent check 01/2024  - wnl.

## 2024-01-15 NOTE — Assessment & Plan Note (Signed)
 Overall appears to be doing well.  Follow.

## 2024-01-15 NOTE — Assessment & Plan Note (Signed)
Colonoscopy 06/2018.

## 2024-01-21 ENCOUNTER — Other Ambulatory Visit: Payer: Self-pay | Admitting: Internal Medicine

## 2024-01-23 DIAGNOSIS — H25811 Combined forms of age-related cataract, right eye: Secondary | ICD-10-CM | POA: Diagnosis not present

## 2024-01-23 DIAGNOSIS — I10 Essential (primary) hypertension: Secondary | ICD-10-CM | POA: Diagnosis not present

## 2024-01-23 DIAGNOSIS — H401111 Primary open-angle glaucoma, right eye, mild stage: Secondary | ICD-10-CM | POA: Diagnosis not present

## 2024-02-03 ENCOUNTER — Telehealth: Payer: Self-pay | Admitting: Internal Medicine

## 2024-02-03 ENCOUNTER — Encounter: Payer: Self-pay | Admitting: Pharmacist

## 2024-02-03 DIAGNOSIS — E875 Hyperkalemia: Secondary | ICD-10-CM

## 2024-02-03 NOTE — Progress Notes (Signed)
 Pharmacy Quality Measure Review  This patient is appearing on a report for being at risk of failing the adherence measure for hypertension (ACEi/ARB) medications this calendar year.   Medication: telmisartan  40 mg Last fill date: 12/27/23 for 30 day supply  Insurance report was not up to date. No action needed at this time.  Medication has been refilled as of 01/23/24. 1 additional refill remaining.

## 2024-02-03 NOTE — Telephone Encounter (Signed)
 Lab orders needed

## 2024-02-06 NOTE — Telephone Encounter (Signed)
Order placed for f/u potassium.

## 2024-02-09 ENCOUNTER — Other Ambulatory Visit

## 2024-02-09 DIAGNOSIS — E875 Hyperkalemia: Secondary | ICD-10-CM

## 2024-02-09 LAB — POTASSIUM: Potassium: 4.4 meq/L (ref 3.5–5.1)

## 2024-02-10 ENCOUNTER — Ambulatory Visit: Payer: Self-pay | Admitting: Internal Medicine

## 2024-02-13 ENCOUNTER — Telehealth: Admitting: Physician Assistant

## 2024-02-13 DIAGNOSIS — J069 Acute upper respiratory infection, unspecified: Secondary | ICD-10-CM

## 2024-02-13 DIAGNOSIS — B9689 Other specified bacterial agents as the cause of diseases classified elsewhere: Secondary | ICD-10-CM

## 2024-02-13 MED ORDER — AZITHROMYCIN 250 MG PO TABS: ORAL_TABLET | ORAL | 0 refills | Status: AC

## 2024-02-13 NOTE — Progress Notes (Signed)
 We are sorry that you are not feeling well.  Here is how we plan to help!  Based on your presentation I believe you most likely have A cough due to bacteria.  When patients have a fever and a productive cough with a change in color or increased sputum production, we are concerned about bacterial bronchitis.  If left untreated it can progress to pneumonia.  If your symptoms do not improve with your treatment plan it is important that you contact your provider.   I have prescribed Azithromyin 250 mg: two tablets now and then one tablet daily for 4 additonal days    In addition you may use A non-prescription cough medication called Mucinex DM: take 2 tablets every 12 hours.  From your responses in the eVisit questionnaire you describe inflammation in the upper respiratory tract which is causing a significant cough.  This is commonly called Bronchitis and has four common causes:   Allergies Viral Infections Acid Reflux Bacterial Infection Allergies, viruses and acid reflux are treated by controlling symptoms or eliminating the cause. An example might be a cough caused by taking certain blood pressure medications. You stop the cough by changing the medication. Another example might be a cough caused by acid reflux. Controlling the reflux helps control the cough.     HOME CARE Only take medications as instructed by your medical team. Complete the entire course of an antibiotic. Drink plenty of fluids and get plenty of rest. Avoid close contacts especially the very young and the elderly Cover your mouth if you cough or cough into your sleeve. Always remember to wash your hands A steam or ultrasonic humidifier can help congestion.   GET HELP RIGHT AWAY IF: You develop worsening fever. You become short of breath You cough up blood. Your symptoms persist after you have completed your treatment plan MAKE SURE YOU  Understand these instructions. Will watch your condition. Will get help right away  if you are not doing well or get worse.  Your e-visit answers were reviewed by a board certified advanced clinical practitioner to complete your personal care plan.  Depending on the condition, your plan could have included both over the counter or prescription medications. If there is a problem please reply  once you have received a response from your provider. Your safety is important to us .  If you have drug allergies check your prescription carefully.    You can use MyChart to ask questions about today's visit, request a non-urgent call back, or ask for a work or school excuse for 24 hours related to this e-Visit. If it has been greater than 24 hours you will need to follow up with your provider, or enter a new e-Visit to address those concerns. You will get an e-mail in the next two days asking about your experience.  I hope that your e-visit has been valuable and will speed your recovery. Thank you for using e-visits.   I have spent 5 minutes in review of e-visit questionnaire, review and updating patient chart, medical decision making and response to patient.   Delon CHRISTELLA Dickinson, PA-C

## 2024-02-18 ENCOUNTER — Other Ambulatory Visit: Payer: Self-pay | Admitting: Internal Medicine

## 2024-03-06 ENCOUNTER — Encounter: Payer: Self-pay | Admitting: Pharmacist

## 2024-03-06 NOTE — Progress Notes (Signed)
 Pharmacy Quality Measure Review  This patient is appearing on a report for being at risk of failing the adherence measure for hypertension (ACEi/ARB) medications this calendar year.   Medication: telmisartan  40 mg Last fill date: 01/23/24 for 30 day supply  Insurance report was not up to date. No action needed at this time.  Medication has been refilled as of 11/22 x30 ds. 88%.

## 2024-03-15 ENCOUNTER — Telehealth: Payer: Self-pay

## 2024-03-15 NOTE — Telephone Encounter (Signed)
 I left a voicemail for patient on both her home answering machine and her cell phone voicemail, asking her to please call us  at [phone].  I also sent a message to patient via MyChart.  E2C2 - when patient calls back, please assist her with scheduling her Welcome to Medicare appointment with Dr. Allena Hamilton.  This is an hour-long visit that needs to take place prior to 11/03/2024.

## 2024-03-17 ENCOUNTER — Other Ambulatory Visit: Payer: Self-pay | Admitting: Internal Medicine

## 2024-04-22 ENCOUNTER — Other Ambulatory Visit: Payer: Self-pay | Admitting: Internal Medicine

## 2024-05-09 ENCOUNTER — Ambulatory Visit: Admitting: Internal Medicine

## 2024-05-10 ENCOUNTER — Other Ambulatory Visit: Payer: Self-pay

## 2024-05-10 DIAGNOSIS — E7800 Pure hypercholesterolemia, unspecified: Secondary | ICD-10-CM

## 2024-05-10 DIAGNOSIS — I10 Essential (primary) hypertension: Secondary | ICD-10-CM

## 2024-05-11 ENCOUNTER — Other Ambulatory Visit

## 2024-05-11 DIAGNOSIS — I10 Essential (primary) hypertension: Secondary | ICD-10-CM

## 2024-05-11 DIAGNOSIS — E7800 Pure hypercholesterolemia, unspecified: Secondary | ICD-10-CM

## 2024-05-11 LAB — CBC WITH DIFFERENTIAL/PLATELET
Basophils Absolute: 0 10*3/uL (ref 0.0–0.1)
Basophils Relative: 0.8 % (ref 0.0–3.0)
Eosinophils Absolute: 0.1 10*3/uL (ref 0.0–0.7)
Eosinophils Relative: 2.7 % (ref 0.0–5.0)
HCT: 38.8 % (ref 36.0–46.0)
Hemoglobin: 12.6 g/dL (ref 12.0–15.0)
Lymphocytes Relative: 49.8 % — ABNORMAL HIGH (ref 12.0–46.0)
Lymphs Abs: 2 10*3/uL (ref 0.7–4.0)
MCHC: 32.4 g/dL (ref 30.0–36.0)
MCV: 93.6 fl (ref 78.0–100.0)
Monocytes Absolute: 0.4 10*3/uL (ref 0.1–1.0)
Monocytes Relative: 10.4 % (ref 3.0–12.0)
Neutro Abs: 1.4 10*3/uL (ref 1.4–7.7)
Neutrophils Relative %: 36.3 % — ABNORMAL LOW (ref 43.0–77.0)
Platelets: 253 10*3/uL (ref 150.0–400.0)
RBC: 4.15 Mil/uL (ref 3.87–5.11)
RDW: 13.2 % (ref 11.5–15.5)
WBC: 3.9 10*3/uL — ABNORMAL LOW (ref 4.0–10.5)

## 2024-05-11 LAB — BASIC METABOLIC PANEL WITH GFR
BUN: 15 mg/dL (ref 6–23)
CO2: 30 meq/L (ref 19–32)
Calcium: 9.5 mg/dL (ref 8.4–10.5)
Chloride: 103 meq/L (ref 96–112)
Creatinine, Ser: 0.93 mg/dL (ref 0.40–1.20)
GFR: 63.75 mL/min
Glucose, Bld: 90 mg/dL (ref 70–99)
Potassium: 4.6 meq/L (ref 3.5–5.1)
Sodium: 139 meq/L (ref 135–145)

## 2024-05-11 LAB — LIPID PANEL
Cholesterol: 236 mg/dL — ABNORMAL HIGH (ref 28–200)
HDL: 78.2 mg/dL
LDL Cholesterol: 145 mg/dL — ABNORMAL HIGH (ref 10–99)
NonHDL: 157.81
Total CHOL/HDL Ratio: 3
Triglycerides: 65 mg/dL (ref 10.0–149.0)
VLDL: 13 mg/dL (ref 0.0–40.0)

## 2024-05-11 LAB — HEPATIC FUNCTION PANEL
ALT: 18 U/L (ref 3–35)
AST: 22 U/L (ref 5–37)
Albumin: 4.4 g/dL (ref 3.5–5.2)
Alkaline Phosphatase: 58 U/L (ref 39–117)
Bilirubin, Direct: 0.1 mg/dL (ref 0.1–0.3)
Total Bilirubin: 0.7 mg/dL (ref 0.2–1.2)
Total Protein: 7.1 g/dL (ref 6.0–8.3)

## 2024-05-14 ENCOUNTER — Encounter: Admitting: Internal Medicine

## 2024-07-02 ENCOUNTER — Encounter: Admitting: Internal Medicine
